# Patient Record
Sex: Female | Born: 1935 | Race: White | Hispanic: No | Smoking: Never smoker
Health system: Southern US, Community
[De-identification: ages and names within clinical notes are randomized; demographics above are authoritative.]

## PROBLEM LIST (undated history)

## (undated) DIAGNOSIS — I519 Heart disease, unspecified: Secondary | ICD-10-CM

## (undated) DIAGNOSIS — I341 Nonrheumatic mitral (valve) prolapse: Secondary | ICD-10-CM

## (undated) DIAGNOSIS — C4491 Basal cell carcinoma of skin, unspecified: Secondary | ICD-10-CM

## (undated) DIAGNOSIS — I5189 Other ill-defined heart diseases: Secondary | ICD-10-CM

## (undated) DIAGNOSIS — I10 Essential (primary) hypertension: Secondary | ICD-10-CM

## (undated) DIAGNOSIS — I493 Ventricular premature depolarization: Secondary | ICD-10-CM

## (undated) DIAGNOSIS — N200 Calculus of kidney: Secondary | ICD-10-CM

## (undated) DIAGNOSIS — I951 Orthostatic hypotension: Secondary | ICD-10-CM

## (undated) DIAGNOSIS — C439 Malignant melanoma of skin, unspecified: Secondary | ICD-10-CM

## (undated) DIAGNOSIS — G9001 Carotid sinus syncope: Secondary | ICD-10-CM

## (undated) DIAGNOSIS — I272 Pulmonary hypertension, unspecified: Secondary | ICD-10-CM

## (undated) DIAGNOSIS — I219 Acute myocardial infarction, unspecified: Secondary | ICD-10-CM

## (undated) DIAGNOSIS — K5520 Angiodysplasia of colon without hemorrhage: Secondary | ICD-10-CM

## (undated) DIAGNOSIS — I442 Atrioventricular block, complete: Secondary | ICD-10-CM

## (undated) DIAGNOSIS — D126 Benign neoplasm of colon, unspecified: Secondary | ICD-10-CM

## (undated) DIAGNOSIS — Z950 Presence of cardiac pacemaker: Secondary | ICD-10-CM

## (undated) DIAGNOSIS — R55 Syncope and collapse: Secondary | ICD-10-CM

## (undated) DIAGNOSIS — K5790 Diverticulosis of intestine, part unspecified, without perforation or abscess without bleeding: Secondary | ICD-10-CM

## (undated) DIAGNOSIS — E785 Hyperlipidemia, unspecified: Secondary | ICD-10-CM

## (undated) DIAGNOSIS — I2510 Atherosclerotic heart disease of native coronary artery without angina pectoris: Secondary | ICD-10-CM

## (undated) DIAGNOSIS — IMO0002 Reserved for concepts with insufficient information to code with codable children: Secondary | ICD-10-CM

## (undated) DIAGNOSIS — I071 Rheumatic tricuspid insufficiency: Secondary | ICD-10-CM

## (undated) HISTORY — DX: Basal cell carcinoma of skin, unspecified: C44.91

## (undated) HISTORY — DX: Atrioventricular block, complete: I44.2

## (undated) HISTORY — DX: Other ill-defined heart diseases: I51.89

## (undated) HISTORY — DX: Presence of cardiac pacemaker: Z95.0

## (undated) HISTORY — DX: Benign neoplasm of colon, unspecified: D12.6

## (undated) HISTORY — PX: SKIN CANCER EXCISION: SHX779

## (undated) HISTORY — PX: PACEMAKER INSERTION: SHX728

## (undated) HISTORY — DX: Malignant melanoma of skin, unspecified: C43.9

## (undated) HISTORY — DX: Pulmonary hypertension, unspecified: I27.20

## (undated) HISTORY — DX: Heart disease, unspecified: I51.9

## (undated) HISTORY — DX: Rheumatic tricuspid insufficiency: I07.1

## (undated) HISTORY — DX: Hyperlipidemia, unspecified: E78.5

## (undated) HISTORY — PX: CARDIAC CATHETERIZATION: SHX172

## (undated) HISTORY — DX: Reserved for concepts with insufficient information to code with codable children: IMO0002

## (undated) HISTORY — DX: Atherosclerotic heart disease of native coronary artery without angina pectoris: I25.10

## (undated) HISTORY — DX: Nonrheumatic mitral (valve) prolapse: I34.1

## (undated) HISTORY — PX: CATARACT EXTRACTION: SUR2

---
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
## 1969-04-03 HISTORY — PX: TONSILLECTOMY: SHX5217

## 1978-04-03 HISTORY — PX: ABDOMINAL HYSTERECTOMY: SHX81

## 1979-04-04 HISTORY — PX: NASAL RECONSTRUCTION: SHX2069

## 1999-04-20 ENCOUNTER — Encounter: Admission: RE | Admit: 1999-04-20 | Discharge: 1999-04-20 | Payer: Self-pay | Admitting: Gynecology

## 1999-04-20 ENCOUNTER — Encounter: Payer: Self-pay | Admitting: Gynecology

## 2000-01-11 ENCOUNTER — Encounter (INDEPENDENT_AMBULATORY_CARE_PROVIDER_SITE_OTHER): Payer: Self-pay

## 2000-01-11 ENCOUNTER — Other Ambulatory Visit: Admission: RE | Admit: 2000-01-11 | Discharge: 2000-01-11 | Payer: Self-pay | Admitting: Otolaryngology

## 2000-03-12 ENCOUNTER — Other Ambulatory Visit: Admission: RE | Admit: 2000-03-12 | Discharge: 2000-03-12 | Payer: Self-pay | Admitting: Gynecology

## 2000-04-24 ENCOUNTER — Encounter (HOSPITAL_BASED_OUTPATIENT_CLINIC_OR_DEPARTMENT_OTHER): Payer: Self-pay | Admitting: Internal Medicine

## 2000-04-24 ENCOUNTER — Encounter: Admission: RE | Admit: 2000-04-24 | Discharge: 2000-04-24 | Payer: Self-pay | Admitting: Internal Medicine

## 2001-04-19 ENCOUNTER — Encounter: Admission: RE | Admit: 2001-04-19 | Discharge: 2001-04-19 | Payer: Self-pay | Admitting: Internal Medicine

## 2001-04-19 ENCOUNTER — Encounter: Payer: Self-pay | Admitting: Internal Medicine

## 2002-04-14 ENCOUNTER — Encounter: Payer: Self-pay | Admitting: Internal Medicine

## 2002-04-14 ENCOUNTER — Encounter: Admission: RE | Admit: 2002-04-14 | Discharge: 2002-04-14 | Payer: Self-pay | Admitting: Internal Medicine

## 2002-11-28 ENCOUNTER — Ambulatory Visit (HOSPITAL_COMMUNITY): Admission: RE | Admit: 2002-11-28 | Discharge: 2002-11-28 | Payer: Self-pay | Admitting: Cardiovascular Disease

## 2003-05-15 ENCOUNTER — Encounter: Admission: RE | Admit: 2003-05-15 | Discharge: 2003-05-15 | Payer: Self-pay | Admitting: Internal Medicine

## 2003-05-26 ENCOUNTER — Other Ambulatory Visit: Admission: RE | Admit: 2003-05-26 | Discharge: 2003-05-26 | Payer: Self-pay | Admitting: Gynecology

## 2004-05-25 ENCOUNTER — Encounter: Admission: RE | Admit: 2004-05-25 | Discharge: 2004-05-25 | Payer: Self-pay | Admitting: Internal Medicine

## 2005-06-02 ENCOUNTER — Ambulatory Visit: Payer: Self-pay | Admitting: Gastroenterology

## 2005-06-02 ENCOUNTER — Encounter (INDEPENDENT_AMBULATORY_CARE_PROVIDER_SITE_OTHER): Payer: Self-pay | Admitting: Specialist

## 2005-06-02 ENCOUNTER — Observation Stay (HOSPITAL_COMMUNITY): Admission: AD | Admit: 2005-06-02 | Discharge: 2005-06-03 | Payer: Self-pay | Admitting: Internal Medicine

## 2005-06-06 ENCOUNTER — Ambulatory Visit: Payer: Self-pay | Admitting: Gastroenterology

## 2005-06-07 ENCOUNTER — Encounter: Admission: RE | Admit: 2005-06-07 | Discharge: 2005-06-07 | Payer: Self-pay | Admitting: Internal Medicine

## 2005-06-15 ENCOUNTER — Ambulatory Visit (HOSPITAL_COMMUNITY): Admission: RE | Admit: 2005-06-15 | Discharge: 2005-06-15 | Payer: Self-pay | Admitting: Gastroenterology

## 2006-02-21 HISTORY — PX: CARDIOVASCULAR STRESS TEST: SHX262

## 2006-06-11 ENCOUNTER — Encounter: Admission: RE | Admit: 2006-06-11 | Discharge: 2006-06-11 | Payer: Self-pay | Admitting: Internal Medicine

## 2007-06-13 ENCOUNTER — Encounter: Admission: RE | Admit: 2007-06-13 | Discharge: 2007-06-13 | Payer: Self-pay | Admitting: Internal Medicine

## 2008-06-15 ENCOUNTER — Encounter: Admission: RE | Admit: 2008-06-15 | Discharge: 2008-06-15 | Payer: Self-pay | Admitting: Internal Medicine

## 2009-08-04 ENCOUNTER — Encounter: Admission: RE | Admit: 2009-08-04 | Discharge: 2009-08-04 | Payer: Self-pay | Admitting: Internal Medicine

## 2009-09-01 ENCOUNTER — Ambulatory Visit: Payer: Self-pay | Admitting: Vascular Surgery

## 2010-08-10 ENCOUNTER — Other Ambulatory Visit: Payer: Self-pay | Admitting: Gynecology

## 2010-08-10 DIAGNOSIS — Z1231 Encounter for screening mammogram for malignant neoplasm of breast: Secondary | ICD-10-CM

## 2010-08-16 NOTE — Procedures (Signed)
CAROTID DUPLEX EXAM   INDICATION:  Carotid bruit.   HISTORY:  Diabetes:  No.  Cardiac:  MI.  Hypertension:  On medications.  Smoking:  No.  Previous Surgery:  No.  CV History:  Asymptomatic.  Amaurosis Fugax No, Paresthesias No, Hemiparesis No                                       RIGHT             LEFT  Brachial systolic pressure:         130               132  Brachial Doppler waveforms:         WNL               WNL  Vertebral direction of flow:        Antegrade         Antegrade  DUPLEX VELOCITIES (cm/sec)  CCA peak systolic                   65                80  ECA peak systolic                   86                87  ICA peak systolic                   P=58, M/D=158     P=78, M/D=189  ICA end diastolic                   P=18, M/D=46      P=23, M/D=54  PLAQUE MORPHOLOGY:                  Mixed             Mixed  PLAQUE AMOUNT:                      Mild              Mild  PLAQUE LOCATION:                    Bifurcation/ICA   Bifurcation/ICA   IMPRESSION:  1. Bilateral mid/distal internal carotid artery velocities are      suggestive of 40%-59% stenosis.  2. Bilateral mid/distal internal carotid arteries are tortuous, which      may be elevating velocities.        ___________________________________________  Janetta Hora Fields, MD   AS/MEDQ  D:  09/01/2009  T:  09/01/2009  Job:  [id number]

## 2010-08-17 ENCOUNTER — Ambulatory Visit: Payer: Self-pay

## 2010-08-19 NOTE — H&P (Signed)
NAMECHRISTIANNA, Newman                   ACCOUNT NO.:  [id number]   MEDICAL RECORD NO.:  [id number]                   PATIENT TYPE:  OIB   LOCATION:                                       FACILITY:  [facility]   PHYSICIAN:  Vesta Mixer, M.D.              DATE OF BIRTH:  April 11, 1927   DATE OF ADMISSION:  11/28/2002  DATE OF DISCHARGE:                                HISTORY & PHYSICAL   HISTORY OF PRESENT ILLNESS:  Joy Newman is a 77 year old female with no  significant past cardiac history.  She recently had an episode of chest pain  and a subsequent stress Cardiolite which revealed the presence of an apical  infarction.  She is now referred for a heart catheterization for further  evaluation.   [name] has been relatively healthy.  She has not had any episodes of  chest pain or shortness of breath.  She has had tonsillectomy, appendectomy,  and a hysterectomy.  She has never had any specific cardiac problems.  Last  week, she had a severe episode of chest pain and chest tightness.  It  radiated through to her back.  There was no associated shortness of breath.  She gradually got better but did go to see her medical doctor who referred  her here for stress Cardiolite study.   CURRENT MEDICATIONS:  1. Premarin 0.625 mg q.d.  2. Calcium 1 q.d.  3. Vitamin E 1 q.d.  4. Vitamin C 1 q.d.  5. Multivitamin 1 q.d.  6. Aspirin 81 mg q.d.   ALLERGIES:  She is allergic to QUININE and TETRACYCLINE.   PAST MEDICAL HISTORY:  1. History of appendectomy.  2. History of tonsillectomy.  3. History of hysterectomy.   SOCIAL HISTORY:  The patient does not smoke.  She drinks alcohol rarely.   FAMILY HISTORY:  Noncontributory.   REVIEW OF SYMPTOMS:  Reviewed today and is essentially negative.   PHYSICAL EXAMINATION:  GENERAL:  She is an elderly female in no acute  distress.  She is alert and oriented x 3 and her mood and affect are normal.  VITAL SIGNS:  Her weight is 115, blood  pressure is 134/70 with a heart rate  of 80.  NECK:  There are 2+ carotids.  She has no bruits, JVD, and no thyromegaly.  LUNGS:  Clear to auscultation.  HEART:  Regular rate, S1 and S2. No murmurs, rubs, or gallops.  ABDOMEN:  Good bowel sounds and nontender.  EXTREMITIES:  She has no clubbing, cyanosis, or edema.  NEUROLOGICAL:  Nonfocal.  Cranial nerves II through XII intact.  Motor and  sensory function are intact.   IMPRESSION:  Joy Newman presents with a recent episode of chest pain which is  consistent with angina.  She had a stress Cardiolite study which revealed  the presence of an apical infarction with a mildly to moderately depressed  left ventricular systolic function.  I have recommended that we  proceed with  heart catheterization.  We discussed the risks, benefits, and options of  heart catheterization.  She understands and agrees to proceed.                                                Vesta Mixer, M.D.    PJN/MEDQ  D:  11/27/2002  T:  11/27/2002  Job:  [id number]   cc:   Gaspar Garbe, M.D.  [address]  [place]  [place] [address]  Fax: [phone]   Cardiac Catheter Lab

## 2010-08-19 NOTE — H&P (Signed)
Joy Newman, Joy Newman       ACCOUNT NO.:  [id number]   MEDICAL RECORD NO.:  [id number]          PATIENT TYPE:  INP   LOCATION:  5735                         FACILITY:  [facility]   PHYSICIAN:  Gaspar Garbe, M.D.DATE OF BIRTH:  12-Dec-1927   DATE OF ADMISSION:  06/02/2005  DATE OF DISCHARGE:                                HISTORY & PHYSICAL   CHIEF COMPLAINT:  Anemia requiring transfusion.   HISTORY OF PRESENT ILLNESS:  The patient is a 77 year old white female with  a history of colonic arteriovenous malformations, noted on colonoscopy in  December 2005, being heme-positive.  She has never had a history of a true  anemia with this.  The patient came in for her usual visit on May 31, 2005, complaining of some weakness, but was still participating in her yoga  and regular activities.  Workup was undertaken involving metabolic tests as  well as blood count, with the result of her hemoglobin showing that her  hemoglobin was 6.8.  The patient was called on the evening of May 31, 2005, and urged to be admitted to the hospital, which she refused because  she had tickets for the [employer] basketball game and had a friend whom she  needed to take to [facility], who was very ill on Thursday.  I indicated to her  that she needed to go directly to the hospital if she developed any new  symptoms, including weakness or fatigue or any dark tarry stools, and that  it would be in her best interest to be transfused before proceeding with her  above-mentioned plans; however, the patient would hear nothing of it and  took the risk on her own.  Arrangements were made for her to be electively  admitted on the morning of June 02, 2005, for the above-needed transfusion,  and this has been done.   On seeing the patient in the hospital, she seemed to be very content and had  not had any progression in her symptoms over the past couple of days, and  had no complaints of any changes in bowel  movements.   ALLERGIES:  QUININE AND TETRACYCLINE.   MEDICATIONS:  1.  Premarin 0.3 mg p.o. daily.  2.  Calcium b.i.d.  3.  Vitamin D b.i.d.  4.  Vitamin C 1000 mg daily.  5.  Aspirin 81 mg daily.  6.  Multivitamin.  7.  Bio-10.  8.  Cardizem LA 120 mg p.o. q.p.m.  9.  Maxzide 37.5 mg/25 mg, 1/2 tab daily.   PAST MEDICAL HISTORY:  1.  Arteriovenous malformations on colonoscopy in December 2005, done by Dr.      Judie Petit T. Russella Dar.  2.  Mitral valve prolapse.  3.  Hyperlipidemia.  4.  Kidney stones.  5.  Osteopenia.   PAST SURGICAL HISTORY:  1.  The patient has had a melanoma removal from her right shoulder in      February 2006.  2.  The patient has had a basal cell removal from her left leg.  3.  Tonsillectomy.  4.  Appendectomy.  5.  Total abdominal hysterectomy with BSO.  6.  Nasal reconstruction.  SOCIAL HISTORY:  The patient lives in [place] by herself.  She is a  retired Higher education careers adviser.  She is a non-smoker, non-drinker.   FAMILY HISTORY:  Mother died at age 29, of a CVA.  Father died at age 58, of  nephritis.  She had a brother who died of lung cancer, who also had coronary  artery disease and strokes.  A sister died of nephritis at age 6.  Of her  surviving relatives, they have had TIA's, macular degeneration and  congestive heart failure.  She has no children.   REVIEW OF SYSTEMS:  The patient notes fatigue as noted above, but has no  specific complaints on an 11-point review.   INSTRUCTIVES:  The patient is a full code.   PHYSICAL EXAMINATION:  VITAL SIGNS:  Temp 97.4F, BP 131/67, HR 73, RR 20.  GENERAL:  In no acute distress.  HEENT:  Normocephalic and atraumatic.  Pupils equal, round, reactive to  light and accommodation.  EOMI.  ENT within normal limits.  NECK:  Supple, no lymphadenopathy, jugular venous distention or bruit.  HEART:  A regular rate and rhythm.  No murmur, rub or gallop appreciated  actively.  LUNGS:  Clear to  auscultation bilaterally.  ABDOMEN:  Soft, nontender.  Normoactive bowel sounds.  No  hepatosplenomegaly.  EXTREMITIES:  No clubbing, cyanosis or edema.  RECTAL:  The patient has trace guaiac positivity.  NEUROLOGIC:  Oriented x3.  Cranial nerves II-XII  intact.  No lateralizing  signs or weakness.   LABORATORY DATA:  Done on May 31, 2005:  Hemoglobin 6.8, hematocrit  21.2, MCV 73, white count normal at 5.6, platelets 284.  This was compared  to her August 2006, hemoglobin of 11, hematocrit 33 with an MCV of 88,.   ASSESSMENT/PLAN:  1.  Anemia requiring transfusion:  The patient will have 2 units of packed      red blood cells transfused today and given her decreased MCV, this is      most likely chronic iron deficiency anemia.  Will add iron      supplementations at the time of discharge, after she has followed a GI      workup.  Will also check a B12 and folate level, to look for other      causes.  Her aspirin has been held, and we will add a PPI.  Dr. Ardell Isaacs      office has been notified for a consultation.  The patient will probably      undergo an EGD, colonoscopy or any combination thereof for further      workup.  2.  Hypertension:  Continue on her Maxzide and Cardizem.  3.  Osteopenia:  Continue on calcium and vitamin D twice daily.  4.  Mitral valve prolapse: Will need endocarditis prophylaxis pre-procedure,      as had been done during her colonoscopy.      Gaspar Garbe, M.D.  Electronically Signed     RWT/MEDQ  D:  06/02/2005  T:  06/02/2005  Job:  [id number]

## 2010-08-19 NOTE — Cardiovascular Report (Signed)
Joy Newman, Joy Newman                   ACCOUNT NO.:  [id number]   MEDICAL RECORD NO.:  [id number]                   PATIENT TYPE:  OIB   LOCATION:  2899                                 FACILITY:  [facility]   PHYSICIAN:  Vesta Mixer, M.D.              DATE OF BIRTH:  1928/01/12   DATE OF PROCEDURE:  11/28/2002  DATE OF DISCHARGE:                              CARDIAC CATHETERIZATION   PROCEDURE:  Left heart catheterization with coronary angiography and  aortography.   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  She recently had an episode of  chest pain and was referred to our office for a stress Cardiolite study.  She was found to have an apical defect consistent with a recent apical  myocardial infarction.  She was referred for heart catheterization for  further evaluation.   DESCRIPTION OF PROCEDURE:  The right femoral artery was easily cannulated  using a modified Seldinger technique.   HEMODYNAMICS:  The left ventricular pressure is 148/80 with an aortic  pressure of 149/77.   ANGIOGRAPHY:  1. The left main is moderate in size and is normal.  2. The left anterior descending artery is a relatively large vessel.  It is     quite tortuous throughout its course.  There are no discreet stenoses.  3. The first diagonal artery is a moderate to large vessel.  It is fairly     normal.  4. The left circumflex artery is a fairly small system.  It is small and     basically provides a small obtuse marginal vessel.  5. The right coronary artery is large and dominant.  It has a relatively     anterior takeoff and was best engaged using an Amplatz left-1 catheter.     The posterior descending artery and the posterolateral segment artery are     all normal.   An aortogram was performed to evaluate for the possibility of an anomalous  circumflex artery.  There were no other vessels seen.  Only the  right  coronary artery and the left main with the relatively small circumflex were  identified.   LEFT VENTRICULOGRAM:  The left ventriculogram was performed in a 30 RAO  position.  It revealed apical akinesis.  The overall ejection fraction is  around 45-50%.  It appears to be slightly better than was previously seen on  the Cardiolite scan.   COMPLICATIONS:  None.    CONCLUSION:  Probable recent anterior apical myocardial infarction.  She has  no discreet stenosis, but most likely had spasm of her tortuous left  anterior descending.  The left anterior descending is quite small down  toward the apex and it is quite possible that she had some spasm.  She does  not need any PTCA or bypass surgery.  We will continue with medical therapy.  Vesta Mixer, M.D.    PJN/MEDQ  D:  11/28/2002  T:  11/28/2002  Job:  [id number]   cc:   Gaspar Garbe, M.D.  [address]  [place]  [place] [address]  Fax: [phone]

## 2010-08-25 ENCOUNTER — Ambulatory Visit
Admission: RE | Admit: 2010-08-25 | Discharge: 2010-08-25 | Disposition: A | Discharge status: Home / Self Care | Payer: Medicare Other | Source: Ambulatory Visit | Attending: Gynecology | Admitting: Gynecology

## 2010-08-25 DIAGNOSIS — Z1231 Encounter for screening mammogram for malignant neoplasm of breast: Secondary | ICD-10-CM

## 2011-04-10 DIAGNOSIS — Z85820 Personal history of malignant melanoma of skin: Secondary | ICD-10-CM | POA: Diagnosis not present

## 2011-04-10 DIAGNOSIS — Z85828 Personal history of other malignant neoplasm of skin: Secondary | ICD-10-CM | POA: Diagnosis not present

## 2011-04-10 DIAGNOSIS — D239 Other benign neoplasm of skin, unspecified: Secondary | ICD-10-CM | POA: Diagnosis not present

## 2011-04-10 DIAGNOSIS — L821 Other seborrheic keratosis: Secondary | ICD-10-CM | POA: Diagnosis not present

## 2011-04-11 ENCOUNTER — Encounter: Payer: Self-pay | Admitting: *Deleted

## 2011-04-11 ENCOUNTER — Telehealth: Payer: Self-pay | Admitting: Cardiovascular Disease

## 2011-04-11 ENCOUNTER — Emergency Department (HOSPITAL_COMMUNITY): Payer: Medicare Other

## 2011-04-11 ENCOUNTER — Encounter (HOSPITAL_COMMUNITY): Payer: Self-pay

## 2011-04-11 ENCOUNTER — Encounter: Payer: Self-pay | Admitting: Cardiovascular Disease

## 2011-04-11 ENCOUNTER — Encounter: Payer: Self-pay | Admitting: Internal Medicine

## 2011-04-11 ENCOUNTER — Inpatient Hospital Stay (HOSPITAL_COMMUNITY)
Admission: EM | Admit: 2011-04-11 | Discharge: 2011-04-14 | DRG: 243 | Disposition: A | Discharge status: Home / Self Care | Payer: Medicare Other | Attending: Cardiovascular Disease | Admitting: Cardiovascular Disease

## 2011-04-11 DIAGNOSIS — Z7982 Long term (current) use of aspirin: Secondary | ICD-10-CM

## 2011-04-11 DIAGNOSIS — R079 Chest pain, unspecified: Secondary | ICD-10-CM | POA: Diagnosis not present

## 2011-04-11 DIAGNOSIS — R0789 Other chest pain: Secondary | ICD-10-CM | POA: Diagnosis not present

## 2011-04-11 DIAGNOSIS — R002 Palpitations: Secondary | ICD-10-CM | POA: Diagnosis not present

## 2011-04-11 DIAGNOSIS — I5189 Other ill-defined heart diseases: Secondary | ICD-10-CM

## 2011-04-11 DIAGNOSIS — I519 Heart disease, unspecified: Secondary | ICD-10-CM | POA: Diagnosis not present

## 2011-04-11 DIAGNOSIS — I059 Rheumatic mitral valve disease, unspecified: Secondary | ICD-10-CM | POA: Diagnosis present

## 2011-04-11 DIAGNOSIS — G9001 Carotid sinus syncope: Secondary | ICD-10-CM | POA: Diagnosis present

## 2011-04-11 DIAGNOSIS — E785 Hyperlipidemia, unspecified: Secondary | ICD-10-CM | POA: Diagnosis not present

## 2011-04-11 DIAGNOSIS — R55 Syncope and collapse: Secondary | ICD-10-CM

## 2011-04-11 DIAGNOSIS — I252 Old myocardial infarction: Secondary | ICD-10-CM

## 2011-04-11 DIAGNOSIS — K5520 Angiodysplasia of colon without hemorrhage: Secondary | ICD-10-CM | POA: Insufficient documentation

## 2011-04-11 DIAGNOSIS — I2510 Atherosclerotic heart disease of native coronary artery without angina pectoris: Secondary | ICD-10-CM | POA: Diagnosis present

## 2011-04-11 DIAGNOSIS — I495 Sick sinus syndrome: Principal | ICD-10-CM | POA: Diagnosis present

## 2011-04-11 DIAGNOSIS — I10 Essential (primary) hypertension: Secondary | ICD-10-CM | POA: Diagnosis not present

## 2011-04-11 DIAGNOSIS — I493 Ventricular premature depolarization: Secondary | ICD-10-CM | POA: Insufficient documentation

## 2011-04-11 DIAGNOSIS — R918 Other nonspecific abnormal finding of lung field: Secondary | ICD-10-CM | POA: Diagnosis not present

## 2011-04-11 DIAGNOSIS — I071 Rheumatic tricuspid insufficiency: Secondary | ICD-10-CM

## 2011-04-11 DIAGNOSIS — I440 Atrioventricular block, first degree: Secondary | ICD-10-CM | POA: Diagnosis present

## 2011-04-11 DIAGNOSIS — Z79899 Other long term (current) drug therapy: Secondary | ICD-10-CM

## 2011-04-11 DIAGNOSIS — I2789 Other specified pulmonary heart diseases: Secondary | ICD-10-CM | POA: Diagnosis present

## 2011-04-11 DIAGNOSIS — Z9181 History of falling: Secondary | ICD-10-CM | POA: Diagnosis not present

## 2011-04-11 DIAGNOSIS — I079 Rheumatic tricuspid valve disease, unspecified: Secondary | ICD-10-CM | POA: Diagnosis present

## 2011-04-11 DIAGNOSIS — I341 Nonrheumatic mitral (valve) prolapse: Secondary | ICD-10-CM | POA: Insufficient documentation

## 2011-04-11 DIAGNOSIS — R001 Bradycardia, unspecified: Secondary | ICD-10-CM

## 2011-04-11 DIAGNOSIS — I442 Atrioventricular block, complete: Secondary | ICD-10-CM | POA: Diagnosis present

## 2011-04-11 HISTORY — DX: Ventricular premature depolarization: I49.3

## 2011-04-11 HISTORY — DX: Angiodysplasia of colon without hemorrhage: K55.20

## 2011-04-11 HISTORY — DX: Diverticulosis of intestine, part unspecified, without perforation or abscess without bleeding: K57.90

## 2011-04-11 HISTORY — DX: Essential (primary) hypertension: I10

## 2011-04-11 HISTORY — DX: Calculus of kidney: N20.0

## 2011-04-11 HISTORY — DX: Syncope and collapse: R55

## 2011-04-11 HISTORY — DX: Carotid sinus syncope: G90.01

## 2011-04-11 LAB — DIFFERENTIAL
Eosinophils Absolute: 0 10*3/uL (ref 0.0–0.7)
Eosinophils Relative: 0 % (ref 0–5)
Lymphs Abs: 1.9 10*3/uL (ref 0.7–4.0)
Monocytes Relative: 6 % (ref 3–12)

## 2011-04-11 LAB — BASIC METABOLIC PANEL
CO2: 31 mEq/L (ref 19–32)
GFR calc Af Amer: >90 mL/min (ref >90)
Glucose, Bld: 113 mg/dL — ABNORMAL HIGH (ref 70–99)
Potassium: 3.7 mEq/L (ref 3.5–5.1)
Sodium: 135 mEq/L (ref 135–145)

## 2011-04-11 LAB — PROTIME-INR
INR: 0.91 (ref 0.00–1.49)
Prothrombin Time: 12.5 seconds (ref 11.6–15.2)

## 2011-04-11 LAB — COMPREHENSIVE METABOLIC PANEL
ALT: 15 U/L (ref 0–35)
Albumin: 3.7 g/dL (ref 3.5–5.2)
Alkaline Phosphatase: 73 U/L (ref 39–117)
Chloride: 95 mEq/L — ABNORMAL LOW (ref 96–112)
GFR calc Af Amer: >90 mL/min (ref >90)
Glucose, Bld: 113 mg/dL — ABNORMAL HIGH (ref 70–99)
Potassium: 3.7 mEq/L (ref 3.5–5.1)
Sodium: 137 mEq/L (ref 135–145)
Total Protein: 6.6 g/dL (ref 6.0–8.3)

## 2011-04-11 LAB — CBC
Hemoglobin: 13.6 g/dL (ref 12.0–15.0)
MCHC: 34.1 g/dL (ref 30.0–36.0)
MCHC: 33.9 g/dL (ref 30.0–36.0)
MCV: 98.6 fL (ref 78.0–100.0)
RBC: 4.05 MIL/uL (ref 3.87–5.11)
WBC: 10.9 10*3/uL — ABNORMAL HIGH (ref 4.0–10.5)
WBC: 12.1 10*3/uL — ABNORMAL HIGH (ref 4.0–10.5)

## 2011-04-11 LAB — POCT I-STAT TROPONIN I

## 2011-04-11 LAB — URINALYSIS, ROUTINE W REFLEX MICROSCOPIC
Bilirubin Urine: NEGATIVE
Glucose, UA: NEGATIVE mg/dL
Nitrite: NEGATIVE
Specific Gravity, Urine: 1.007 (ref 1.005–1.030)
pH: 7.5 (ref 5.0–8.0)

## 2011-04-11 LAB — CARDIAC PANEL(CRET KIN+CKTOT+MB+TROPI)
CK, MB: 3.7 ng/mL (ref 0.3–4.0)
Total CK: 140 U/L (ref 7–177)

## 2011-04-11 LAB — URINE MICROSCOPIC-ADD ON

## 2011-04-11 MED ORDER — ONDANSETRON HCL 4 MG/2ML IJ SOLN
4.0000 mg | Freq: Once | INTRAMUSCULAR | Status: AC
  Administered 2011-04-11: 4 mg via INTRAVENOUS
  Filled 2011-04-11 (×2): qty 2

## 2011-04-11 MED ORDER — DILTIAZEM HCL ER BEADS 120 MG PO CP24
120.0000 mg | ORAL_CAPSULE | Freq: Every day | ORAL | Status: DC
Start: 2011-04-12 — End: 2011-04-12
  Filled 2011-04-11: qty 1

## 2011-04-11 MED ORDER — VITAMIN D3 25 MCG (1000 UNIT) PO TABS
2000.0000 [IU] | ORAL_TABLET | Freq: Every day | ORAL | Status: DC
  Administered 2011-04-12 – 2011-04-13 (×2): 2000 [IU] via ORAL
  Filled 2011-04-11 (×2): qty 2

## 2011-04-11 MED ORDER — ENOXAPARIN SODIUM 40 MG/0.4ML ~~LOC~~ SOLN
40.0000 mg | SUBCUTANEOUS | Status: DC
  Administered 2011-04-12 – 2011-04-13 (×2): 40 mg via SUBCUTANEOUS
  Filled 2011-04-11 (×2): qty 0.4

## 2011-04-11 MED ORDER — TRIAMTERENE-HCTZ 37.5-25 MG PO TABS
0.5000 | ORAL_TABLET | Freq: Every day | ORAL | Status: DC
  Administered 2011-04-12 – 2011-04-14 (×3): 0.5 via ORAL
  Filled 2011-04-11 (×3): qty 0.5

## 2011-04-11 MED ORDER — ESTROGENS CONJUGATED 0.3 MG PO TABS
0.3000 mg | ORAL_TABLET | Freq: Every day | ORAL | Status: DC
  Administered 2011-04-12 – 2011-04-13 (×2): 0.3 mg via ORAL
  Filled 2011-04-11 (×2): qty 1

## 2011-04-11 MED ORDER — ASPIRIN 81 MG PO CHEW
81.0000 mg | CHEWABLE_TABLET | Freq: Every day | ORAL | Status: DC
  Administered 2011-04-12 – 2011-04-13 (×2): 81 mg via ORAL
  Filled 2011-04-11 (×2): qty 1

## 2011-04-11 MED ORDER — PANTOPRAZOLE SODIUM 40 MG PO TBEC
40.0000 mg | DELAYED_RELEASE_TABLET | Freq: Every day | ORAL | Status: DC
  Administered 2011-04-12 – 2011-04-14 (×3): 40 mg via ORAL
  Filled 2011-04-11 (×3): qty 1

## 2011-04-11 NOTE — ED Notes (Signed)
The pts iv has been leaking all  Connections tightened.  Pt c/o a lt  Sided headache.  Ice pack refilled and given to the pt for her headache

## 2011-04-11 NOTE — ED Notes (Signed)
Pt transported to CT ?

## 2011-04-11 NOTE — Telephone Encounter (Signed)
Fu to previous problem Pt wants to see Dr Elease Hashimoto asap. She didn't want to see Lawson Fiscal please call her back

## 2011-04-11 NOTE — ED Notes (Signed)
The pt is alert family at the bedside.  She says her nausea is almost gone,  Alert oriented skin warm and dry

## 2011-04-11 NOTE — ED Notes (Signed)
Dr Donnie Aho at the beside.  The pt has been sleeping intermittently

## 2011-04-11 NOTE — Telephone Encounter (Signed)
TRIED BOTH NUMBERS NO ANSWER

## 2011-04-11 NOTE — Telephone Encounter (Signed)
New problem Pt said she is having irregular heartbeats she wants to talk to you. No sob or chest pain. Please call

## 2011-04-11 NOTE — Telephone Encounter (Signed)
Saw in 2004, hospital

## 2011-04-11 NOTE — Telephone Encounter (Signed)
Spoke with friend and she is at pcp currently.

## 2011-04-11 NOTE — Telephone Encounter (Signed)
FU Call: Pt friend calling wanting to talk with nurse about pt condition. Pt said she saw Dr. Elease Hashimoto every year for a FU and is having some chest tightness as well as irregular HR. Pt would like to know if she can be seen today or ASAP. Please return pt call to discuss further.

## 2011-04-11 NOTE — ED Provider Notes (Signed)
History     CSN: [id number]  Arrival date & time 04/11/11  1731   First MD Initiated Contact with Patient 04/11/11 1731      Chief Complaint  Patient presents with  . Fall  . Altered Mental Status    (Consider location/radiation/quality/duration/timing/severity/associated sxs/prior treatment) HPI history obtained from patient and family members Patient is an 78 year old female with history of mitral valve prolapse and tricuspid regurgitation who presents for syncope. Just prior to arrival, patient had unwitnessed syncopal episode. Her family members in the house and heard her fall in and saw her facedown on a carpeted area. Patient is amnestic to events just before, during, just after the event. When family members are on the ground, she was alert. There is no seizure activity or postictal period. Patient has had issues with generalized weakness as well as palpitations and lightheadedness recently. She has seen her primary doctor for this recently. She is scheduled for a cardiology evaluation tomorrow. Today when she saw primary doctor, she was noted to have tachycardia and ectopy per patient report. Her diltiazem was doubled from 120-40. She's taken one dose of that. She has had intermittent low sodium sternal burning discomfort. But she denies frank chest pain. She has not had dyspnea. She denies lower extremity edema. She denies headache or neck pain after the fall. She has normal strength. She also denies any change in her sensation. Overall severity mouth noted to be moderate. There are no specific modifying factors described.  Past Medical History  Diagnosis Date  . Hyperlipidemia   . Diastolic dysfunction   . Mitral valve prolapse     mitral regurgitation  . CAD (coronary artery disease)     Hx of with an anteroapical M.I  . Pulmonary hypertension      Hx of Mild  . Tricuspid regurgitation     Moderate regurgitation  . Kidney stones   . Arteriovenous malformation of colon      Past Surgical History  Procedure Date  . Appendectomy     Hx of  . Tonsillectomy     Hx of  . Abdominal hysterectomy     Hx of  . Cardiac catheterization     Revealed an apical wall motion abnormality  . Cardiovascular stress test 02-21-2006    EF 84%, which revealed an apical defect  . US echocardiography 08-27-09    EF 55-60%    History reviewed. No pertinent family history.  History  Substance Use Topics  . Smoking status: Never Smoker   . Smokeless tobacco: Never Used  . Alcohol Use: No     Rarely    OB History    Grav Para Term Preterm Abortions TAB SAB Ect Mult Living                  Review of Systems  Constitutional: Negative for fever and chills.  HENT: Negative for facial swelling and neck pain.   Eyes: Negative for visual disturbance.  Respiratory: Negative for cough, chest tightness, shortness of breath and wheezing.   Cardiovascular: Negative for chest pain.  Gastrointestinal: Positive for nausea. Negative for vomiting, abdominal pain and diarrhea.  Genitourinary: Negative for difficulty urinating.  Skin: Negative for rash.  Neurological: Negative for weakness and numbness.  Psychiatric/Behavioral: Negative for behavioral problems and confusion.  All other systems reviewed and are negative.    Allergies  Aspirin; Imdur; Quinine derivatives; and Tetracyclines & related  Home Medications   Current Outpatient Rx  Name Route Sig Dispense  Refill  . VITAMIN C 1000 MG PO TABS Oral Take 1,000 mg by mouth daily.      . ASPIRIN 81 MG PO TABS Oral Take 81 mg by mouth daily.     Marland Kitchen CALCIUM 1500 MG PO TABS Oral Take 1,500 mg by mouth 2 (two) times daily.      Marland Kitchen VITAMIN D 1000 UNITS PO TABS Oral Take 2,000 Units by mouth daily.      Marland Kitchen DILTIAZEM HCL ER BEADS 120 MG PO CP24 Oral Take 120 mg by mouth daily.      Marland Kitchen ESTROGENS CONJUGATED 0.3 MG PO TABS Oral Take 0.3 mg by mouth daily.     . SUPER STRESS 600/BIOTIN PO Oral Take 3 tablets by mouth daily.      .  MULTIVITAMIN PO Oral Take 1 tablet by mouth daily.     Marland Kitchen PANTOPRAZOLE SODIUM 40 MG PO TBEC Oral Take 40 mg by mouth daily.      . TRIAMTERENE-HCTZ 37.5-25 MG PO TABS Oral Take 0.5 tablets by mouth daily.        BP 118/57  Pulse 79  Temp(Src) 97.1 F (36.2 C) (Oral)  Resp 14  SpO2 98%  Physical Exam  Nursing note and vitals reviewed. Constitutional: She is oriented to person, place, and time. She appears well-developed and well-nourished. No distress.  HENT:  Head: Normocephalic.  Nose: Nose normal.  Mouth/Throat: Oropharynx is clear and moist.       Mild abrasion over left cheek without underlying bony tenderness to palpation. No malocclusion. No jaw tenderness palpation. No nasal tenderness to palpation. Extraocular movements are intact without pain. No change in facial sensation.  Eyes: EOM are normal. Pupils are equal, round, and reactive to light.  Neck: Normal range of motion. Neck supple. No tracheal deviation present.       No midline C spine TTP No step offs No  visible injury  Cardiovascular: Normal rate and intact distal pulses.   No murmur heard.      Irregular rhythm  Pulmonary/Chest: Effort normal and breath sounds normal. No respiratory distress. She has no wheezes. She exhibits no tenderness.  Abdominal: Soft. She exhibits no distension. There is no tenderness.       No seat belt stripe or visible injury  Musculoskeletal: Normal range of motion. She exhibits no edema and no tenderness.       No midline T or L spine TTP No step offs  Neurological: She is alert and oriented to person, place, and time. No cranial nerve deficit. Coordination normal.       Normal strength  Mild amnesia regarding events but patient is alert and oriented to person place and date.  Gross sensation intact  Skin: Skin is warm and dry. No rash noted. She is not diaphoretic.  Psychiatric: She has a normal mood and affect. Her behavior is normal. Thought content normal.    ED Course    Procedures (including critical care time)  ECG on 04/11/2011 1838: Heart rate 80. Sinus rhythm. Regular rhythm. First degree heart block. QTC normal. Normal axis. Mild nonspecific T wave changes over V5 and V6. Nonspecific inferior ST change. No prior EKG available for comparison.  Labs Reviewed  CBC - Abnormal; Notable for the following:    WBC 10.9 (*)    All other components within normal limits  BASIC METABOLIC PANEL - Abnormal; Notable for the following:    Chloride 94 (*)    Glucose, Bld 113 (*)  GFR calc non Af Amer 80 (*)    All other components within normal limits  GLUCOSE, CAPILLARY - Abnormal; Notable for the following:    Glucose-Capillary 109 (*)    All other components within normal limits  URINALYSIS, ROUTINE W REFLEX MICROSCOPIC - Abnormal; Notable for the following:    Hgb urine dipstick SMALL (*)    Ketones, ur 15 (*)    All other components within normal limits  URINE MICROSCOPIC-ADD ON - Abnormal; Notable for the following:    Squamous Epithelial / LPF FEW (*)    All other components within normal limits  MAGNESIUM  POCT I-STAT TROPONIN I  I-STAT TROPONIN I  URINALYSIS, ROUTINE W REFLEX MICROSCOPIC   Dg Chest 2 View  04/11/2011  *RADIOLOGY REPORT*  Clinical Data: Fall  CHEST - 2 VIEW  Comparison: None.  Findings: Normal cardiac silhouette.  Lungs are hyperinflated. There is apical thickening on the left and right.  No focal consolidation.  No pneumothorax.  No evidence of rib fracture.  IMPRESSION: No evidence of thoracic trauma.  Emphysematous change.  Original Report Authenticated By: Genevive Bi, M.D.   Ct Head Wo Contrast  04/11/2011  *RADIOLOGY REPORT*  Clinical Data:  Fall.  CT HEAD WITHOUT CONTRAST CT CERVICAL SPINE WITHOUT CONTRAST  Technique:  Multidetector CT imaging of the head and cervical spine was performed following the standard protocol without intravenous contrast.  Multiplanar CT image reconstructions of the cervical spine were also  generated.  Comparison:  None.  CT HEAD  Findings: No skull fracture or intracranial hemorrhage.  No CT evidence of large acute infarct.  Small acute infarct cannot be excluded by CT.  No intracranial mass lesion detected on this unenhanced exam.  IMPRESSION: None no skull fracture or intracranial hemorrhage.  CT CERVICAL SPINE  Findings: No cervical spine fracture.  Degenerative changes C1-2 articulation and C1 occipital articulation.  Mild cranial settling.  Transverse ligament hypertrophy with mild impression upon the cervical medullary junction.  Fusion of the posterior elements of C4-C5.  Minimal anterior slip of C3 felt to be related to C3-4 facet joint degenerative changes.  Various degrees of spinal stenosis and foraminal narrowing.  No abnormal prevertebral soft tissue swelling.  Scarring lung apices.  IMPRESSION: No cervical spine fracture.  Degenerative changes as noted above  Original Report Authenticated By: Fuller Canada, M.D.   Ct Cervical Spine Wo Contrast  04/11/2011  *RADIOLOGY REPORT*  Clinical Data:  Fall.  CT HEAD WITHOUT CONTRAST CT CERVICAL SPINE WITHOUT CONTRAST  Technique:  Multidetector CT imaging of the head and cervical spine was performed following the standard protocol without intravenous contrast.  Multiplanar CT image reconstructions of the cervical spine were also generated.  Comparison:  None.  CT HEAD  Findings: No skull fracture or intracranial hemorrhage.  No CT evidence of large acute infarct.  Small acute infarct cannot be excluded by CT.  No intracranial mass lesion detected on this unenhanced exam.  IMPRESSION: None no skull fracture or intracranial hemorrhage.  CT CERVICAL SPINE  Findings: No cervical spine fracture.  Degenerative changes C1-2 articulation and C1 occipital articulation.  Mild cranial settling.  Transverse ligament hypertrophy with mild impression upon the cervical medullary junction.  Fusion of the posterior elements of C4-C5.  Minimal anterior slip of  C3 felt to be related to C3-4 facet joint degenerative changes.  Various degrees of spinal stenosis and foraminal narrowing.  No abnormal prevertebral soft tissue swelling.  Scarring lung apices.  IMPRESSION: No cervical spine  fracture.  Degenerative changes as noted above  Original Report Authenticated By: Fuller Canada, M.D.     1. Syncope       MDM   Clinical picture consistent with syncope. Patient's recent generalized chest discomfort as well as palpitations and known valvular disorder raise concern for cardiogenic etiology. Her workup here is unremarkable. CT scan is negative for intracranial or cervical injury. I discussed with cardiology, who is following patient for her valvular disease and palpitations. They will admit for her syncope.       Milus Glazier 04/11/11 2211

## 2011-04-11 NOTE — H&P (Signed)
Admit date: 04/11/2011 Name:  Joy Newman Medical record number: [id number] DOB/Age:  [birth year]  78 y.o.  Referring Physician:   [facility]  Primary Cardiologist: Dr. Kristeen Miss  Chief complaint/reason for admission:  Syncope  HPI:  This 78 year old female is brought to the emergency room for evaluation of syncope. The patient's cardiac history is significant for a long-standing history of mitral valve prolapse. She had a cardiac catheterization 2004 that showed an apical wall motion abnormality but no significant obstructive coronary artery disease. She has been managed medically through the years and has been seen intermittently by Dr. Elease Hashimoto. She has a history of hyperlipidemia and has history of mild pulmonary hypertension.  She has not felt well over the past few weeks. She had an upper respiratory infection and the flu and was ill over the Christmas holidays. Last evening she developed some nausea as well as some palpitations. She went to see her primary physician this morning and saw one of his associates and her diltiazem was doubled. She was scheduled to see Dr. Elease Hashimoto.  This evening she had gotten up to go to the bathroom and was standing near her bedside when she felt somewhat nauseated felt some vague tightness in her chest and the next thing she remembered she was being picked up by the ambulance to come to the hospital. She did not remember falling and did not have significant palpitations at the time. She did not have incontinence. Her adopted son was evidently at her house and called the ambulance. She has not had any previous episodes of syncope. She has not had exertional angina and has had no PND, orthopnea or claudication. She normally does yoga.   Past Medical History  Diagnosis Date  . Hyperlipidemia   . Diastolic dysfunction   . Mitral valve prolapse     mitral regurgitation  . CAD (coronary artery disease)     Hx of with an anteroapical M.I   . Pulmonary hypertension      Hx of Mild  . Tricuspid regurgitation     Moderate regurgitation  . Kidney stones   . Arteriovenous malformation of colon        Past Surgical History  Procedure Date  . Appendectomy     Hx of  . Tonsillectomy     Hx of  . Abdominal hysterectomy     Hx of  . Cardiac catheterization     Revealed an apical wall motion abnormality  . Cardiovascular stress test 02-21-2006    EF 84%, which revealed an apical defect  . US echocardiography 08-27-09    EF 55-60%  .   Allergies: is allergic to aspirin; imdur; quinine derivatives; and tetracyclines & related.    Medications:   Prior to Admission medications   Medication Sig Start Date End Date Taking? Authorizing Provider  Ascorbic Acid (VITAMIN C) 1000 MG tablet Take 1,000 mg by mouth daily.     Yes Historical Provider, MD  aspirin 81 MG tablet Take 81 mg by mouth daily.    Yes Historical Provider, MD  Calcium 1500 MG tablet Take 1,500 mg by mouth 2 (two) times daily.     Yes Historical Provider, MD  cholecalciferol (VITAMIN D) 1000 UNITS tablet Take 2,000 Units by mouth daily.     Yes Historical Provider, MD  diltiazem (TIAZAC) 120 MG 24 hr capsule Take 120 mg by mouth daily.     Yes Historical Provider, MD  estrogens, conjugated, (PREMARIN) 0.3 MG tablet Take 0.3  mg by mouth daily.    Yes Historical Provider, MD  Multiple Vitamin (SUPER STRESS 600/BIOTIN PO) Take 3 tablets by mouth daily.     Yes Historical Provider, MD  Multiple Vitamins-Minerals (MULTIVITAMIN PO) Take 1 tablet by mouth daily.    Yes Historical Provider, MD  pantoprazole (PROTONIX) 40 MG tablet Take 40 mg by mouth daily.     Yes Historical Provider, MD  triamterene-hydrochlorothiazide (MAXZIDE-25) 37.5-25 MG per tablet Take 0.5 tablets by mouth daily.     Yes Historical Provider, MD   Family history:  Family History  Problem Relation Age of Onset  . Stroke Mother   . Kidney disease Father   . Cancer Brother   . Kidney disease  Sister     Social history:   Social History Narrative   Retired Garment/textile technologist.  Clinical research associate.  Nondrinker.  Lives alone.  Has adopted son.     Review of Systems:  General ROS: positive for  - fatigue and malaise Ophthalmic ROS: No visual problems ENT ROS: negative Respiratory ROS: Recent upper respiratory infection, mild cough productive of dark sputum. Cardiovascular ROS: See history of present illness Gastrointestinal ROS: Mild nausea, no recent GI bleeding but does have a history of transfusion several years ago. Genito-Urinary ROS: no dysuria, trouble voiding, or hematuria Musculoskeletal ROS: negative Neurological ROS: no TIA or stroke symptoms Other than as noted above, the remainder of the review of systems is normal  Physical Exam: BP 118/57  Pulse 79  Temp(Src) 97.1 F (36.2 C) (Oral)  Resp 14  SpO2 98% General appearance: alert, cooperative, appears stated age and no distress Head: Normocephalic, without obvious abnormality, atraumatic Eyes: conjunctivae/corneas clear. PERRL, EOM's intact. Fundi benign. Ears: normal TM's and external ear canals both ears Nose: Nares normal. Septum midline. Mucosa normal. No drainage or sinus tenderness. Throat: lips, mucosa, and tongue normal; teeth and gums normal Neck: no adenopathy, no carotid bruit, no JVD and supple, symmetrical, trachea midline Lungs: clear to auscultation bilaterally Heart: regular rate and rhythm, S1, S2 normal, no S3 or S4 and 2/6 mid to late systolic murmur heard at the apex Abdomen: soft, non-tender; bowel sounds normal; no masses,  no organomegaly Extremities: extremities normal, atraumatic, no cyanosis or edema Pulses: 2+ and symmetric Skin: Mild ecchymoses noted over face with mild erythema Neurologic: Alert and oriented X 3, normal strength and tone. Normal symmetric reflexes. Normal coordination and gait  Labs: Results for orders placed during the hospital encounter of 04/11/11 (from the past 24  hour(s))  CBC     Status: Abnormal   Collection Time   04/11/11  6:36 PM      Component Value Range   WBC 10.9 (*) 4.0 - 10.5 (K/uL)   RBC 4.19  3.87 - 5.11 (MIL/uL)   Hemoglobin 14.1  12.0 - 15.0 (g/dL)   HCT 16.1  09.6 - 04.5 (%)   MCV 98.6  78.0 - 100.0 (fL)   MCH 33.7  26.0 - 34.0 (pg)   MCHC 34.1  30.0 - 36.0 (g/dL)   RDW 40.9  81.1 - 91.4 (%)   Platelets 259  150 - 400 (K/uL)  BASIC METABOLIC PANEL     Status: Abnormal   Collection Time   04/11/11  6:36 PM      Component Value Range   Sodium 135  135 - 145 (mEq/L)   Potassium 3.7  3.5 - 5.1 (mEq/L)   Chloride 94 (*) 96 - 112 (mEq/L)   CO2 31  19 -  32 (mEq/L)   Glucose, Bld 113 (*) 70 - 99 (mg/dL)   BUN 8  6 - 23 (mg/dL)   Creatinine, Ser 1.61  0.50 - 1.10 (mg/dL)   Calcium 9.6  8.4 - 09.6 (mg/dL)   GFR calc non Af Amer 80 (*) >90 (mL/min)   GFR calc Af Amer >90  >90 (mL/min)  MAGNESIUM     Status: Normal   Collection Time   04/11/11  6:36 PM      Component Value Range   Magnesium 1.9  1.5 - 2.5 (mg/dL)  POCT I-STAT TROPONIN I     Status: Normal   Collection Time   04/11/11  7:02 PM      Component Value Range   Troponin i, poc 0.00  0.00 - 0.08 (ng/mL)   Comment 3           GLUCOSE, CAPILLARY     Status: Abnormal   Collection Time   04/11/11  7:54 PM      Component Value Range   Glucose-Capillary 109 (*) 70 - 99 (mg/dL)   Comment 1 Notify RN     Comment 2 Documented in Chart    URINALYSIS, ROUTINE W REFLEX MICROSCOPIC     Status: Abnormal   Collection Time   04/11/11  8:06 PM      Component Value Range   Color, Urine YELLOW  YELLOW    APPearance CLEAR  CLEAR    Specific Gravity, Urine 1.007  1.005 - 1.030    pH 7.5  5.0 - 8.0    Glucose, UA NEGATIVE  NEGATIVE (mg/dL)   Hgb urine dipstick SMALL (*) NEGATIVE    Bilirubin Urine NEGATIVE  NEGATIVE    Ketones, ur 15 (*) NEGATIVE (mg/dL)   Protein, ur NEGATIVE  NEGATIVE (mg/dL)   Urobilinogen, UA 0.2  0.0 - 1.0 (mg/dL)   Nitrite NEGATIVE  NEGATIVE    Leukocytes, UA  NEGATIVE  NEGATIVE   URINE MICROSCOPIC-ADD ON     Status: Abnormal   Collection Time   04/11/11  8:06 PM      Component Value Range   Squamous Epithelial / LPF FEW (*) RARE    WBC, UA 0-2  <3 (WBC/hpf)   RBC / HPF 0-2  <3 (RBC/hpf)    EKG: Normal sinus rhythm, no QT prolongation, no bradycardia  Radiology: CHEST - 2 VIEW  Findings: Normal cardiac silhouette. Lungs are hyperinflated. There is apical thickening on the left and right. No focal consolidation. No pneumothorax. No evidence of rib fracture.  IMPRESSION:  No evidence of thoracic trauma. Emphysematous change.  IMPRESSIONS:  1. Syncope that may have been due to change in her diltiazem versus blood pressure orthostasis 2. History mitral valve prolapse 3. Hyperlipidemia 4. History of apical wall motion abnormality at catheterization with no significant coronary artery disease 5. History of colonic arteriovenous malformations  PLAN: Admit to telemetry bed. Do orthostatic blood pressures, check echocardiogram, check serial cardiac enzymes but doubt that this is an ischemic event.   Signed: Darden Palmer. MD [facility] 04/11/2011, 10:06 PM

## 2011-04-11 NOTE — ED Notes (Signed)
Found on floor by caregiver onto soft cushion rug. Pt has had trouble with mitral valve prolapse recently, has appt with cards tomorrow for same. Pt confused to events only. Per ems non witnessed fall. No trauma noted to patient, cbg 96, 12 lead good, other vitals 150 palpated, 88-92 hr, pvc on 12 lead.

## 2011-04-12 ENCOUNTER — Encounter (HOSPITAL_COMMUNITY): Payer: Self-pay | Admitting: Internal Medicine

## 2011-04-12 ENCOUNTER — Ambulatory Visit: Payer: Medicare Other | Admitting: Cardiovascular Disease

## 2011-04-12 DIAGNOSIS — I442 Atrioventricular block, complete: Secondary | ICD-10-CM

## 2011-04-12 DIAGNOSIS — I493 Ventricular premature depolarization: Secondary | ICD-10-CM | POA: Insufficient documentation

## 2011-04-12 DIAGNOSIS — I369 Nonrheumatic tricuspid valve disorder, unspecified: Secondary | ICD-10-CM

## 2011-04-12 DIAGNOSIS — G9001 Carotid sinus syncope: Secondary | ICD-10-CM | POA: Insufficient documentation

## 2011-04-12 DIAGNOSIS — R001 Bradycardia, unspecified: Secondary | ICD-10-CM

## 2011-04-12 DIAGNOSIS — R55 Syncope and collapse: Secondary | ICD-10-CM | POA: Diagnosis not present

## 2011-04-12 DIAGNOSIS — I10 Essential (primary) hypertension: Secondary | ICD-10-CM | POA: Insufficient documentation

## 2011-04-12 LAB — TSH: TSH: 1.707 u[IU]/mL (ref 0.350–4.500)

## 2011-04-12 LAB — MRSA PCR SCREENING: MRSA by PCR: NEGATIVE

## 2011-04-12 LAB — CARDIAC PANEL(CRET KIN+CKTOT+MB+TROPI): CK, MB: 3.2 ng/mL (ref 0.3–4.0)

## 2011-04-12 MED ORDER — CHLORHEXIDINE GLUCONATE 4 % EX LIQD
60.0000 mL | Freq: Once | CUTANEOUS | Status: AC
  Administered 2011-04-12: 4 via TOPICAL
  Filled 2011-04-12: qty 60

## 2011-04-12 MED ORDER — METOPROLOL SUCCINATE ER 25 MG PO TB24
25.0000 mg | ORAL_TABLET | Freq: Every day | ORAL | Status: DC
  Filled 2011-04-12: qty 1

## 2011-04-12 MED ORDER — SODIUM CHLORIDE 0.9 % IR SOLN
80.0000 mg | Status: AC
  Administered 2011-04-13: 80 mg
  Filled 2011-04-12: qty 2

## 2011-04-12 MED ORDER — CEFAZOLIN SODIUM 1-5 GM-% IV SOLN
1.0000 g | INTRAVENOUS | Status: AC
  Administered 2011-04-13: 1 g via INTRAVENOUS
  Filled 2011-04-12: qty 50

## 2011-04-12 MED ORDER — DIAZEPAM 5 MG PO TABS
5.0000 mg | ORAL_TABLET | ORAL | Status: AC
  Administered 2011-04-13: 5 mg via ORAL
  Filled 2011-04-12: qty 1

## 2011-04-12 MED ORDER — SODIUM CHLORIDE 0.45 % IV SOLN
INTRAVENOUS | Status: DC
  Administered 2011-04-13: 06:00:00 via INTRAVENOUS

## 2011-04-12 MED ORDER — SODIUM CHLORIDE 0.9 % IV SOLN
INTRAVENOUS | Status: DC
  Administered 2011-04-13: 06:00:00 via INTRAVENOUS

## 2011-04-12 MED ORDER — CHLORHEXIDINE GLUCONATE 4 % EX LIQD
60.0000 mL | Freq: Once | CUTANEOUS | Status: AC
  Administered 2011-04-13: 4 via TOPICAL
  Filled 2011-04-12: qty 60

## 2011-04-12 NOTE — Consults (Signed)
Electrophysiology Consult Note    Patient ID: Joy Newman MRN: [id number], DOB/AGE: 76-Jul-1929 76 y.o.  Admit date: 04/11/2011 Date of Consult: 04/12/2011  Primary Physician: Gaspar Garbe, MD, MD Primary Cardiologist: Nhaser  Chief Complaint: faints Reason for Consultation: pauses  HPI: 78 year old woman with new onset syncope followed by recurrent pauses.  She is a remote history of an anteroapical MI apparently associated with no obstructive coronary disease and a history of mitral valve prolapse with mild mitral regurgitation; prolapse was not confirmed on the ultrasound done today. She is taking Cardizem for years for hypertension since her "MI". She awakened of the other night with an unusual symptom where she had a distorted vision and Crystal like objects in her vision (See below) these were relatively short-lived. She got up and walked about without residua and went back to bed. When she awakened the morning she noted that her heartbeat was irregular. She went to her PCP noted something and increased her Cardizem by an extra dose. That evening, while talking on the phone and without apparent warning, she collapsed to the floor and was found a few moments later facedown with her arms stretched backwards. EMS was called. Her first awareness with some time in the ambulance. She was admitted.  She then had 2 further episodes Hospital both associated with meals. The first was about 10 seconds with breakfast the second about 7 seconds with lunch. The first was associated with PR prolongation prior to the pause. There were fixed PP intervals throughout. She then had other episodes where she has atrial bigeminy that might have been associated with modest hypotension.  She does not knowledge dizziness with the turning of her head. However, when I did carotid massage she had an 8 second pause. She also had a visual stimulation similar to what she described a couple of nights ago. .The  patient denies chest pain, shortness of breath, nocturnal dyspnea, orthopnea or peripheral edema.  There have been no palpitations, lightheadedness or syncope.    Past Medical History  Diagnosis Date  . Hyperlipidemia   . Diastolic dysfunction   . CAD (coronary artery disease) no obstruction at cath     Hx of with an anteroapical M.I  . Pulmonary hypertension      Hx of Mild  . Tricuspid regurgitation     Moderate regurgitation  . Kidney stones   . Arteriovenous malformation of colon   . Diverticulosis   . Carotid sinus hypersensitivity   . Syncope   . Hypertension       Surgical History:  Past Surgical History  Procedure Date  . Appendectomy     Hx of  . Tonsillectomy     Hx of  . Abdominal hysterectomy     Hx of  . Cardiac catheterization     Revealed an apical wall motion abnormality  . Cardiovascular stress test 02-21-2006    EF 84%, which revealed an apical defect     Prescriptions prior to admission  Medication Sig Dispense Refill  . Ascorbic Acid (VITAMIN C) 1000 MG tablet Take 1,000 mg by mouth daily.        Marland Kitchen aspirin 81 MG tablet Take 81 mg by mouth daily.       . Calcium 1500 MG tablet Take 1,500 mg by mouth 2 (two) times daily.        . cholecalciferol (VITAMIN D) 1000 UNITS tablet Take 2,000 Units by mouth daily.        Marland Kitchen diltiazem (TIAZAC) 120  MG 24 hr capsule Take 120 mg by mouth daily.        Marland Kitchen estrogens, conjugated, (PREMARIN) 0.3 MG tablet Take 0.3 mg by mouth daily.       . Multiple Vitamin (SUPER STRESS 600/BIOTIN PO) Take 3 tablets by mouth daily.        . Multiple Vitamins-Minerals (MULTIVITAMIN PO) Take 1 tablet by mouth daily.       . pantoprazole (PROTONIX) 40 MG tablet Take 40 mg by mouth daily.        Marland Kitchen triamterene-hydrochlorothiazide (MAXZIDE-25) 37.5-25 MG per tablet Take 0.5 tablets by mouth daily.          Inpatient Medications:     . aspirin  81 mg Oral Daily  . cholecalciferol  2,000 Units Oral Daily  . enoxaparin  40 mg  Subcutaneous Q24H  . estrogens (conjugated)  0.3 mg Oral Daily  . ondansetron (ZOFRAN) IV  4 mg Intravenous Once  . pantoprazole  40 mg Oral Q1200  . triamterene-hydrochlorothiazide  0.5 each Oral Daily  . DISCONTD: diltiazem  120 mg Oral Daily  . DISCONTD: metoprolol succinate  25 mg Oral Daily    Allergies:  Allergies  Allergen Reactions  . Aspirin     Intolerant and cause GI Bleed  . Imdur (Isosorbide Mononitrate)     Intolerant and cause headaches and nausea  . Quinine Derivatives     Increase in Heart Rate and Decrease in BP   . Tetracyclines & Related     Rash    History   Social History  . Marital Status: Widowed    Spouse Name: N/A    Number of Children: N/A  . Years of Education: N/A   Occupational History  . Not on file.   Social History Main Topics  . Smoking status: Never Smoker   . Smokeless tobacco: Never Used  . Alcohol Use: No     Rarely  . Drug Use: No  . Sexually Active: Not on file   Other Topics Concern  . Not on file   Social History Narrative   Retired Geologist, engineering.  Nonsmoker.  Nondrinker.  Lives alone.  Has adopted son.     Family History  Problem Relation Age of Onset  . Stroke Mother   . Kidney disease Father   . Cancer Brother   . Kidney disease Sister         Review of Systems:     Cardiac Review of Systems: {Y] = yes [ ]  = no  Chest Pain [    ]  Resting SOB [   ] Exertional SOB  [  ]  Orthopnea [  ]   Pedal Edema [   ]    Palpitations [ Y ] Syncope  [  ]   Presyncope [   ]  General Review of Systems: [Y] = yes [  ]=no Constitional: recent weight change [  ]; anorexia [  ]; fatigue [  ]; nausea [  Y]; night sweats [  ]; fever [  ]; or chills [  ];  Dental: poor dentition[  ];   Eye : blurred vision [  ]; diplopia [   ]; vision changes [  ];  Amaurosis fugax[  ]; Resp: cough [  ];   wheezing[  ];  hemoptysis[  ]; shortness of breath[  ]; paroxysmal nocturnal dyspnea[  ]; dyspnea on exertion[  ]; or orthopnea[  ];  GI:  gallstones[  ], vomiting[  ];  dysphagia[  ]; melena[  ];  hematochezia [  ]; heartburn[  ];   Hx of  Colonoscopy[  ]; GU: kidney stones [  ]; hematuria[  ];   dysuria [  ];  nocturia[  ];  history of     obstruction [  ];                 Skin: rash, swelling[  ];, hair loss[  ];  peripheral edema[  ];  or itching[  ]; Musculosketetal: myalgias[  ];  joint swelling[  ];  joint erythema[  ];  joint pain[Y-neck  ];  back pain[  ];  Heme/Lymph: bruising[  ];  bleeding[  ];  anemia[  ];  Neuro: TIA[  ];  headaches[  ];  stroke[  ];  vertigo[  ];  seizures[  ];   paresthesias[  ];  difficulty walking[  ];  Psych:depression[  ]; anxiety[  ];  Endocrine: diabetes[  ];  thyroid dysfunction[  ];  Immunizations: Flu [  ]; Pneumococcal[  ];  Physical Exam:  Blood pressure 118/42, pulse 79, temperature 97.3 F (36.3 C), temperature source Oral, resp. rate 17, height 5\' 2"  (1.575 m), weight 111 lb 14.4 oz (50.758 kg), SpO2 96.00%.  General appearance: alert, cooperative, appears stated age and no distress HEENT:PERRLA Neck: no adenopathy, no carotid bruit, no JVD, supple, symmetrical, trachea midline and thyroid not enlarged, symmetric, no tenderness/mass/nodules Back: negative, symmetric, no curvature. ROM normal. No CVA tenderness. Resp: clear to auscultation bilaterally Chest wall: no tenderness Cardio:regular rate & rhythm, no murmurs, S1 normal and S2 normal GI: soft, non-tender; bowel sounds normal; no masses,  no organomegaly Extremities: extremities normal, atraumatic, no cyanosis or edema Pulses: 2+ and symmetric Skin: Skin color, texture, turgor normal. No rashes or lesions Lymph nodes: Cervical, supraclavicular, and axillary nodes normal. Neuro:Alert and oriented x3. Gait normal. Reflexes and motor strength normal and symmetric. Cranial nerves 2-12 and  sensation grossly intact. Psychnormal affect   EKG: Dated yesterday demonstrates sinus rhythm at 80 with intervals of 0.20/0.08/0.40 is nonspecific ST-T changes inferolaterally We'll echocardiogram dated January 8 for a primary care office demonstrated sinus rhythm with PVCs and PACs.  Basic Metabolic Panel:  Lab 04/11/11 1610 04/11/11 1836  NA 137 135  K 3.7 3.7  CL 95* 94*  CO2 31 31  GLUCOSE 113* 113*  BUN 7 8  CREATININE 0.74 0.68  CALCIUM 9.7 9.6  MG -- 1.9  PHOS -- --   Cardiac Enzymes:  Basename 04/12/11 0615 04/11/11 2223  CKTOTAL 138 140  CKMB 3.2 3.7  CKMBINDEX -- --  TROPONINI <0.30 <0.30   CBC:  Lab 04/11/11 2223 04/11/11 1836  WBC 12.1* 10.9*  NEUTROABS 9.4* --  HGB 13.6 14.1  HCT 40.1 41.3  MCV 99.0 98.6  PLT 256 259    Liver Function Tests:  Basename 04/11/11 2223  AST 22  ALT 15  ALKPHOS 73  BILITOT 0.4  PROT 6.6  ALBUMIN 3.7    Basename 04/11/11 2223  TSH 1.707  T4TOTAL --  T3FREE --  THYROIDAB --   ASSESSMENT and PLAN  The patient had recurrent episodes of documented complete heart block associated with EEG. She also had an antecedent episode of syncope unassociated with eating. Interestingly there was PR prolongation prior to the first episode of documented heart block suggesting a block occurred in the A-V node which would make autonomic input a likely culprit. Eating and the nausea and vomiting that accompanied it may have served as the trigger.  This does not explain the episode the night before and the almost certainly are related. Carotid sinus massage was markedly abnormal with an 8 second pause and the associated symptoms of visual distortion were not unlike what she had sensed the night before when she awakened at sleep suggesting that this preceded to further addition of her Cardizem.  There is no clear trigger identified but we need to be concerned as to a possible GI issue that is serving in this capacity, i.e. at the nausea  and vomiting triggered the heart block as opposed to be an accompanying symptom of the heart block. However, with a carotid sinus hypersensitivity and the syncope the possibility that there was neck manipulation is a consequence of the nausea as well as on the telephone created possible unifying diagnosis and I have recommended to the family that we undertake pacing. I have reviewed with him the potential benefits and risks including but not limited to death perforation of the heart and/or lung lead dislodgment and infection they understand these risks and are willing to proceed.  There has been a lot of discussion about mitral and tricuspid valve disease. These were minimally abnormal on today's echo. In addition there was no comment made as to the presence of mitral valve prolapse.  We have talked about the potential vasomotor contribution to her syncope. As such she will get a pacemaker that allows for response to changes in hemodynamic status is a Medtronic rate drop or [employer]ron[employer]'s CLS.  I'm not sure that the PVCs and PACs she has had to play any role in her syncope; and apart from being aware of them I'm not sure that they play other rales either. She did have a fair amount of them however on her ECG and we need to keep this issue in mind       Patient Active Hospital Problem List: Syncope (04/11/2011)   Heart block AV complete (04/12/2011)  Carotid sinus hypersensitivity  PAC/PVCs        Signed, Sherryl Manges MD

## 2011-04-12 NOTE — Progress Notes (Signed)
Pt had 2 pauses of 4.81 then one beat followed by another pause of 4.78 with heart block. Pt reported more nausea and funny feeling in chest. BP 122/72. Heart rate spontaneously converted back to SR in 70's. Ward Givens NP notified. Will cont to monitor. Pt currently still has zoll at bedside with pads on her.

## 2011-04-12 NOTE — ED Provider Notes (Signed)
I saw and evaluated the patient, reviewed the resident's note and I agree with the findings and plan.   Lynita Groseclose A. Arian Murley, MD 04/12/11 1453 

## 2011-04-12 NOTE — Progress Notes (Addendum)
Pt had a 10.16 second pause with some bradycardia/heart block in 30's. Pt reported feeling nauseous and grabbing her head. She spontaneously resolved back SR in the 70's. BP was 16109. [facility] [place] [place] notified. Instructed RN to place pads on patient and place zoll at bedside which was done. Will continue to monitor.

## 2011-04-12 NOTE — Progress Notes (Signed)
TRANSFERRED-IN FROM 3700 BY BED AWAKE AND ALERT, HOOKED UP TO THE ZOLL. DENIED ANY DISCOMFORT. CONTINUE TO MONITOR.

## 2011-04-12 NOTE — Progress Notes (Signed)
  Echocardiogram 2D Echocardiogram has been performed.  Jorje Guild [facility] 04/12/2011, 10:38 AM

## 2011-04-12 NOTE — Progress Notes (Signed)
I transferred [name] to room 2922. I gave report to the stepdown nurse assigned to her. Transfer was successful with no complications.

## 2011-04-12 NOTE — Progress Notes (Signed)
Patient Name: Joy Newman Date of Encounter: 04/12/2011     Principal Problem:  *Syncope Active Problems:  Palpitations  Hyperlipidemia  Mitral valve prolapse  Symptomatic bradycardia  Heart block AV complete    SUBJECTIVE:  CTSP 2/2 recurrent presyncope in the setting of prolonged heart block.  Pt currently in sinus rhythm with 1st degree avb and asymptomatic but has now had multiple symptomatic pauses today with evidence of high grade HB.  Pt says she got up yesterday am feeling poorly and took her usual meds.  After this, she felt like her vision was off, " i was seeing stars."  She called 911 and was seen by paramedics.  "my heart rate was erratic" when seen by paramedics and pt saw pcp (Dr. Clelia Croft).  EKG from that visit is in hard copy of chart and reveals RSR, 80, borderline 1st degree avb, pac's, and pvc's.  Pt was advised to increase her home dose of Dilt from 120 to 240 daily.  She went home and took an additional 120mg  of Dilt (had already taken 120 that am) and some time later, while talking on the phone, she had sudden syncope.  She is not aware of any prodromal Ss.  Her son found her face down on the floor, breathing heavily.  EMS was again called, and pt regained consciousness by the time they arrived.  She had no events overnight but this am, has had multiple presyncopal spells in the setting of prolonged pauses.  She has not received Diltiazem since her home dose @ ~ 1:30 yesterday afternoon.  No obvious lab findings to explain arrhythmia.   CURRENT MEDS    . aspirin  81 mg Oral Daily  . cholecalciferol  2,000 Units Oral Daily  . enoxaparin  40 mg Subcutaneous Q24H  . estrogens (conjugated)  0.3 mg Oral Daily  . ondansetron (ZOFRAN) IV  4 mg Intravenous Once  . pantoprazole  40 mg Oral Q1200  . triamterene-hydrochlorothiazide  0.5 each Oral Daily    OBJECTIVE  Filed Vitals:   04/12/11 0646 04/12/11 0651 04/12/11 0847 04/12/11 1255  BP: 76/48 104/57  123/53 122/72  Pulse: 85 90 68 70  Temp:      TempSrc:      Resp:      Height:      Weight:      SpO2:        Intake/Output Summary (Last 24 hours) at 04/12/11 1331 Last data filed at 04/11/11 1948  Gross per 24 hour  Intake    200 ml  Output      0 ml  Net    200 ml   Weight change:   PHYSICAL EXAM  General: Well developed, well nourished, in no acute distress. Head: Normocephalic, atraumatic, sclera non-icteric, no xanthomas, nares are without discharge.  Neck: Supple without bruits or JVD. Lungs:  Resp regular and unlabored, CTA. Heart: RRR no s3, s4, or murmurs. Abdomen: Soft, non-tender, non-distended, BS + x 4.  Msk:  Strength and tone appears normal for age. Extremities: No clubbing, cyanosis or edema. DP/PT/Radials 2+ and equal bilaterally. Neuro: Alert and oriented X 3. Moves all extremities spontaneously. Psych: Normal affect.  LABS:  CBC:  Basename 04/11/11 2223 04/11/11 1836  WBC 12.1* 10.9*  NEUTROABS 9.4* --  HGB 13.6 14.1  HCT 40.1 41.3  MCV 99.0 98.6  PLT 256 259   Basic Metabolic Panel:  Basename 04/11/11 2223 04/11/11 1836  NA 137 135  K 3.7 3.7  CL 95* 94*  CO2 31 31  GLUCOSE 113* 113*  BUN 7 8  CREATININE 0.74 0.68  CALCIUM 9.7 9.6  MG -- 1.9  PHOS -- --   Liver Function Tests:  [facility] 04/11/11 2223  AST 22  ALT 15  ALKPHOS 73  BILITOT 0.4  PROT 6.6  ALBUMIN 3.7  Cardiac Enzymes:  Basename 04/12/11 0615 04/11/11 2223  CKTOTAL 138 140  CKMB 3.2 3.7  CKMBINDEX -- --  TROPONINI <0.30 <0.30   Thyroid Function Tests:  Basename 04/11/11 2223  TSH 1.707  T4TOTAL --  T3FREE --  THYROIDAB --     TELE  RSR, 1st degree avb w/ pr of ~ .  She then developed widening of pr, consistent with wenckebach - with dropped qrs, followed by complete heart block x ~ 6 secs and then an additional 4 secs with presyncope.  ASSESSMENT AND PLAN:  1.  Symptomatic bradycardia/High grade Heart Block/Syncope:  Reviewed with Dr.  Elease Hashimoto.  Will discuss with EP.  Keep npo for now.  tx to stepdown.    Signed, Nicolasa Ducking NP

## 2011-04-12 NOTE — Progress Notes (Signed)
Subjective:   Joy Newman is an 78 yo with hx of MVP, CP, ( normal coronaries but with a Takasubo syndrome like presentation in 2004 ) admitted 04/11/11 after having an episode of syncope.  She had been complaining of palpitations and was scheduled to see me in the office this am at 9.  She was seen by her primary MD and her dose of Diltiazem was doubled.  I do not have her HR at the time of that office visit.  She feels better. No further episodes of syncope.      Marland Kitchen aspirin  81 mg Oral Daily  . cholecalciferol  2,000 Units Oral Daily  . diltiazem  120 mg Oral Daily  . enoxaparin  40 mg Subcutaneous Q24H  . estrogens (conjugated)  0.3 mg Oral Daily  . ondansetron (ZOFRAN) IV  4 mg Intravenous Once  . pantoprazole  40 mg Oral Q1200  . triamterene-hydrochlorothiazide  0.5 each Oral Daily      Objective:  Vital Signs in the last 24 hours: Blood pressure 104/57, pulse 90, temperature 98.3 F (36.8 C), temperature source Oral, resp. rate 18, height 5\' 2"  (1.575 m), weight 111 lb 14.4 oz (50.758 kg), SpO2 96.00%. Temp:  [97.1 F (36.2 C)-98.3 F (36.8 C)] 98.3 F (36.8 C) (01/09 0500) Pulse Rate:  [67-92] 90  (01/09 0651) Resp:  [14-21] 18  (01/09 0500) BP: (76-136)/(48-73) 104/57 mmHg (01/09 0651) SpO2:  [95 %-98 %] 96 % (01/09 0500) Weight:  [111 lb 14.4 oz (50.758 kg)] 111 lb 14.4 oz (50.758 kg) (01/09 0016)  Intake/Output from previous day: 01/08 0701 - 01/09 0700 In: 200 [I.V.:200] Out: -  Intake/Output from this shift:    Physical Exam: The patient is alert and oriented x 3.  The mood and affect are normal.   Skin: warm and dry.  Color is normal.    HEENT:   the sclera are nonicteric.  The mucous membranes are moist.  The carotids are 2+ without bruits.  There is no thyromegaly.  There is no JVD.    Lungs: clear.  The chest wall is non tender.    Heart: regular rate with a normal S1 and S2.  She has a mid systolic click with a soft murmur.  Abdomen: good bowel  sounds.  There is no guarding or rebound.  There is no hepatosplenomegaly or tenderness.  There are no masses.   Extremities:  no clubbing, cyanosis, or edema.  The legs are without rashes.  The distal pulses are intact.   Neuro:  Cranial nerves II - XII are intact.  Motor and sensory functions are intact.     Lab Results:  [facility] 04/11/11 2223 04/11/11 1836  WBC 12.1* 10.9*  HGB 13.6 14.1  PLT 256 259    Basename 04/11/11 2223 04/11/11 1836  NA 137 135  K 3.7 3.7  CL 95* 94*  CO2 31 31  GLUCOSE 113* 113*  BUN 7 8  CREATININE 0.74 0.68    Basename 04/12/11 0615 04/11/11 2223  TROPONINI <0.30 <0.30   No results found for this basename: BNP in the last 72 hours Hepatic Function Panel  Basename 04/11/11 2223  PROT 6.6  ALBUMIN 3.7  AST 22  ALT 15  ALKPHOS 73  BILITOT 0.4  BILIDIR --  IBILI --   No results found for this basename: CHOL, HDL, LDLCALC, LDLDIRECT, TRIG, CHOLHDL    Basename 04/11/11 2223  INR 0.91   Tele:  NSR  Assessment/Plan:  1.  Syncope:  Following an increase in her Diltiazem dose.  May be due to orthostasis vs. Transient heart block.  HR and PR interval are normal this AM.  Her BP was very low this AM.   Echo this AM.  Ambulate in hall this am.  Will hold Dilt and try Metoprolol XL 25 QD.   Disposition: home tomorrow if she does well.  Joy Newman, Joy Newman., MD, [facility] 04/12/2011, 7:52 AM LOS: Day 1

## 2011-04-13 ENCOUNTER — Encounter (HOSPITAL_COMMUNITY)
Admission: EM | Disposition: A | Discharge status: Home / Self Care | Payer: Self-pay | Source: Home / Self Care | Attending: Cardiovascular Disease

## 2011-04-13 DIAGNOSIS — I2789 Other specified pulmonary heart diseases: Secondary | ICD-10-CM | POA: Diagnosis present

## 2011-04-13 DIAGNOSIS — I079 Rheumatic tricuspid valve disease, unspecified: Secondary | ICD-10-CM | POA: Diagnosis present

## 2011-04-13 DIAGNOSIS — I440 Atrioventricular block, first degree: Secondary | ICD-10-CM | POA: Diagnosis present

## 2011-04-13 DIAGNOSIS — I2510 Atherosclerotic heart disease of native coronary artery without angina pectoris: Secondary | ICD-10-CM | POA: Diagnosis present

## 2011-04-13 DIAGNOSIS — I442 Atrioventricular block, complete: Secondary | ICD-10-CM | POA: Diagnosis present

## 2011-04-13 DIAGNOSIS — I498 Other specified cardiac arrhythmias: Secondary | ICD-10-CM | POA: Diagnosis not present

## 2011-04-13 DIAGNOSIS — Z950 Presence of cardiac pacemaker: Secondary | ICD-10-CM | POA: Diagnosis not present

## 2011-04-13 DIAGNOSIS — I10 Essential (primary) hypertension: Secondary | ICD-10-CM | POA: Diagnosis present

## 2011-04-13 DIAGNOSIS — I252 Old myocardial infarction: Secondary | ICD-10-CM | POA: Diagnosis not present

## 2011-04-13 DIAGNOSIS — I495 Sick sinus syndrome: Secondary | ICD-10-CM | POA: Diagnosis present

## 2011-04-13 DIAGNOSIS — J984 Other disorders of lung: Secondary | ICD-10-CM | POA: Diagnosis not present

## 2011-04-13 DIAGNOSIS — R911 Solitary pulmonary nodule: Secondary | ICD-10-CM | POA: Diagnosis not present

## 2011-04-13 DIAGNOSIS — E785 Hyperlipidemia, unspecified: Secondary | ICD-10-CM | POA: Diagnosis present

## 2011-04-13 DIAGNOSIS — G9001 Carotid sinus syncope: Secondary | ICD-10-CM | POA: Diagnosis present

## 2011-04-13 DIAGNOSIS — R55 Syncope and collapse: Secondary | ICD-10-CM | POA: Diagnosis not present

## 2011-04-13 DIAGNOSIS — Z7982 Long term (current) use of aspirin: Secondary | ICD-10-CM | POA: Diagnosis not present

## 2011-04-13 DIAGNOSIS — I059 Rheumatic mitral valve disease, unspecified: Secondary | ICD-10-CM | POA: Diagnosis present

## 2011-04-13 DIAGNOSIS — Z79899 Other long term (current) drug therapy: Secondary | ICD-10-CM | POA: Diagnosis not present

## 2011-04-13 HISTORY — PX: PERMANENT PACEMAKER INSERTION: SHX5480

## 2011-04-13 SURGERY — PERMANENT PACEMAKER INSERTION
Anesthesia: LOCAL

## 2011-04-13 MED ORDER — HEPARIN (PORCINE) IN NACL 2-0.9 UNIT/ML-% IJ SOLN
INTRAMUSCULAR | Status: AC
  Filled 2011-04-13: qty 1000

## 2011-04-13 MED ORDER — FENTANYL CITRATE 0.05 MG/ML IJ SOLN
INTRAMUSCULAR | Status: AC
  Filled 2011-04-13: qty 2

## 2011-04-13 MED ORDER — ACETAMINOPHEN 325 MG PO TABS: 325.0000 mg | ORAL_TABLET | Freq: Four times a day (QID) | ORAL | Status: DC | PRN

## 2011-04-13 MED ORDER — CEFAZOLIN SODIUM 1-5 GM-% IV SOLN
1.0000 g | Freq: Four times a day (QID) | INTRAVENOUS | Status: AC
  Administered 2011-04-13 – 2011-04-14 (×3): 1 g via INTRAVENOUS
  Filled 2011-04-13 (×3): qty 50

## 2011-04-13 MED ORDER — ONDANSETRON HCL 4 MG/2ML IJ SOLN: 4.0000 mg | Freq: Four times a day (QID) | INTRAMUSCULAR | Status: DC | PRN

## 2011-04-13 MED ORDER — LIDOCAINE HCL (PF) 1 % IJ SOLN
INTRAMUSCULAR | Status: AC
  Filled 2011-04-13: qty 60

## 2011-04-13 MED ORDER — MIDAZOLAM HCL 5 MG/5ML IJ SOLN
INTRAMUSCULAR | Status: AC
  Filled 2011-04-13: qty 5

## 2011-04-13 MED ORDER — SODIUM CHLORIDE 0.9 % IV SOLN
250.0000 mL | INTRAVENOUS | Status: AC
  Administered 2011-04-13: 250 mL via INTRAVENOUS

## 2011-04-13 MED ORDER — ACETAMINOPHEN 325 MG PO TABS: 325.0000 mg | ORAL_TABLET | ORAL | Status: DC | PRN

## 2011-04-13 NOTE — Interval H&P Note (Signed)
History and Physical Interval Note:  04/13/2011 4:34 PM  Joy Newman  has presented today for surgery, with the diagnosis of Bradycardia  The various methods of treatment have been discussed with the patient and family. After consideration of risks, benefits and other options for treatment, the patient has consented to  Procedure(s): PERMANENT PACEMAKER INSERTION as a surgical intervention .  The patients' history has been reviewed, patient examined, no change in status, stable for surgery.  I have reviewed the patients' chart and labs.  Questions were answered to the patient's satisfaction.     Lewayne Bunting

## 2011-04-13 NOTE — H&P (View-Only) (Signed)
 Patient Name: Joy Newman Date of Encounter: 04/12/2011     Principal Problem:  *Syncope Active Problems:  Palpitations  Hyperlipidemia  Mitral valve prolapse  Symptomatic bradycardia  Heart block AV complete    SUBJECTIVE:  CTSP 2/2 recurrent presyncope in the setting of prolonged heart block.  Pt currently in sinus rhythm with 1st degree avb and asymptomatic but has now had multiple symptomatic pauses today with evidence of high grade HB.  Pt says she got up yesterday am feeling poorly and took her usual meds.  After this, she felt like her vision was off, " i was seeing stars."  She called 911 and was seen by paramedics.  "my heart rate was erratic" when seen by paramedics and pt saw pcp (Dr. Shaw).  EKG from that visit is in hard copy of chart and reveals RSR, 80, borderline 1st degree avb, pac's, and pvc's.  Pt was advised to increase her home dose of Dilt from 120 to 240 daily.  She went home and took an additional 120mg of Dilt (had already taken 120 that am) and some time later, while talking on the phone, she had sudden syncope.  She is not aware of any prodromal Ss.  Her son found her face down on the floor, breathing heavily.  EMS was again called, and pt regained consciousness by the time they arrived.  She had no events overnight but this am, has had multiple presyncopal spells in the setting of prolonged pauses.  She has not received Diltiazem since her home dose @ ~ 1:30 yesterday afternoon.  No obvious lab findings to explain arrhythmia.   CURRENT MEDS    . aspirin  81 mg Oral Daily  . cholecalciferol  2,000 Units Oral Daily  . enoxaparin  40 mg Subcutaneous Q24H  . estrogens (conjugated)  0.3 mg Oral Daily  . ondansetron (ZOFRAN) IV  4 mg Intravenous Once  . pantoprazole  40 mg Oral Q1200  . triamterene-hydrochlorothiazide  0.5 each Oral Daily    OBJECTIVE  Filed Vitals:   04/12/11 0646 04/12/11 0651 04/12/11 0847 04/12/11 1255  BP: 76/48 104/57  123/53 122/72  Pulse: 85 90 68 70  Temp:      TempSrc:      Resp:      Height:      Weight:      SpO2:        Intake/Output Summary (Last 24 hours) at 04/12/11 1331 Last data filed at 04/11/11 1948  Gross per 24 hour  Intake    200 ml  Output      0 ml  Net    200 ml   Weight change:   PHYSICAL EXAM  General: Well developed, well nourished, in no acute distress. Head: Normocephalic, atraumatic, sclera non-icteric, no xanthomas, nares are without discharge.  Neck: Supple without bruits or JVD. Lungs:  Resp regular and unlabored, CTA. Heart: RRR no s3, s4, or murmurs. Abdomen: Soft, non-tender, non-distended, BS + x 4.  Msk:  Strength and tone appears normal for age. Extremities: No clubbing, cyanosis or edema. DP/PT/Radials 2+ and equal bilaterally. Neuro: Alert and oriented X 3. Moves all extremities spontaneously. Psych: Normal affect.  LABS:  CBC:  Basename 04/11/11 2223 04/11/11 1836  WBC 12.1* 10.9*  NEUTROABS 9.4* --  HGB 13.6 14.1  HCT 40.1 41.3  MCV 99.0 98.6  PLT 256 259   Basic Metabolic Panel:  Basename 04/11/11 2223 04/11/11 1836  NA 137 135  K 3.7 3.7    CL 95* 94*  CO2 31 31  GLUCOSE 113* 113*  BUN 7 8  CREATININE 0.74 0.68  CALCIUM 9.7 9.6  MG -- 1.9  PHOS -- --   Liver Function Tests:  Basename 04/11/11 2223  AST 22  ALT 15  ALKPHOS 73  BILITOT 0.4  PROT 6.6  ALBUMIN 3.7  Cardiac Enzymes:  Basename 04/12/11 0615 04/11/11 2223  CKTOTAL 138 140  CKMB 3.2 3.7  CKMBINDEX -- --  TROPONINI <0.30 <0.30   Thyroid Function Tests:  Basename 04/11/11 2223  TSH 1.707  T4TOTAL --  T3FREE --  THYROIDAB --     TELE  RSR, 1st degree avb w/ pr of ~ 220msec.  She then developed widening of pr, consistent with wenckebach - with dropped qrs, followed by complete heart block x ~ 6 secs and then an additional 4 secs with presyncope.  ASSESSMENT AND PLAN:  1.  Symptomatic bradycardia/High grade Heart Block/Syncope:  Reviewed with Dr.  Nahser.  Will discuss with EP.  Keep npo for now.  tx to stepdown.    Signed, Khairi Garman NP  

## 2011-04-13 NOTE — Op Note (Signed)
DDD PPM implanted via left subclavian vein. O#[id number].

## 2011-04-14 ENCOUNTER — Inpatient Hospital Stay (HOSPITAL_COMMUNITY): Payer: Medicare Other

## 2011-04-14 ENCOUNTER — Other Ambulatory Visit: Payer: Self-pay

## 2011-04-14 DIAGNOSIS — R911 Solitary pulmonary nodule: Secondary | ICD-10-CM | POA: Diagnosis not present

## 2011-04-14 DIAGNOSIS — J984 Other disorders of lung: Secondary | ICD-10-CM | POA: Diagnosis not present

## 2011-04-14 DIAGNOSIS — Z950 Presence of cardiac pacemaker: Secondary | ICD-10-CM | POA: Diagnosis not present

## 2011-04-14 NOTE — Progress Notes (Signed)
  Patient Name: Joy Newman      SUBJECTIVE: Patient is status post pacemaker implantation for syncope pauses and carotid sinus hypersensitivity. She is without significant chest pain and no shortness of breath Past Medical History  Diagnosis Date  . Hyperlipidemia   . Diastolic dysfunction   . CAD (coronary artery disease) no obstruction at cath     Hx of with an anteroapical M.I  . Pulmonary hypertension      Hx of Mild  . Tricuspid regurgitation     Moderate regurgitation  . Kidney stones   . Arteriovenous malformation of colon   . Diverticulosis   . Carotid sinus hypersensitivity   . Syncope   . Hypertension   . PVC and PACs     PHYSICAL EXAM Filed Vitals:   04/14/11 0100 04/14/11 0200 04/14/11 0300 04/14/11 0500  BP: 108/57 110/54 117/62 130/56  Pulse: 68 71 69 80  Temp:    97.5 F (36.4 C)  TempSrc:    Oral  Resp:    20  Height:      Weight:      SpO2:    95%    Well developed and nourished in no acute distress HENT normal Neck supple with JVP-flat Carotids brisk and full without bruits Without hematoma or tenderness Clear Regular rate and rhythm, no murmurs or gallops Abd-soft with active BS without hepatomegaly No Clubbing cyanosis edema Skin-warm and dry A & Oriented  Grossly normal sensory and motor function  TELEMETRY: Reviewed telemetry pt in nsr:    Intake/Output Summary (Last 24 hours) at 04/14/11 0757 Last data filed at 04/13/11 1400  Gross per 24 hour  Intake    700 ml  Output    800 ml  Net   -100 ml    LABS: Basic Metabolic Panel:  Lab 04/11/11 7829 04/11/11 1836  NA 137 135  K 3.7 3.7  CL 95* 94*  CO2 31 31  GLUCOSE 113* 113*  BUN 7 8  CREATININE 0.74 0.68  CALCIUM 9.7 9.6  MG -- 1.9  PHOS -- --   Cardiac Enzymes:  Basename 04/12/11 0615 04/11/11 2223  CKTOTAL 138 140  CKMB 3.2 3.7  CKMBINDEX -- --  TROPONINI <0.30 <0.30   CBC:  Lab 04/11/11 2223 04/11/11 1836  WBC 12.1* 10.9*  NEUTROABS 9.4* --    HGB 13.6 14.1  HCT 40.1 41.3  MCV 99.0 98.6  PLT 256 259   PROTIME:  Basename 04/11/11 2223  LABPROT 12.5  INR 0.91   Liver Function Tests:  Basename 04/11/11 2223  AST 22  ALT 15  ALKPHOS 73  BILITOT 0.4  PROT 6.6  ALBUMIN 3.7      Device Interrogation  normal device function  ASSESSMENT AND PLAN:  Patient Active Hospital Problem List: Syncope (04/11/2011)   Assessment: No recurrent syncope. Will anticipate discharge today. No driving for 6 weeks.    Plan:  Carotid sinus hypersensitivity ()   Assessment: As above   Hypertension ()   Assessment: Reasonably controlled    Plan:      Discharge today   F/u wound clininc 10-14 days F/u SK 68month F/u 2wk R Tisovec   Signed, Sherryl Manges MD  04/14/2011

## 2011-04-14 NOTE — Discharge Summary (Signed)
CARDIOLOGY DISCHARGE SUMMARY   Patient ID: Joy Newman MRN: [id number] DOB/AGE: 09-18-1927 78 y.o.  Admit date: 04/11/2011 Discharge date: 04/14/2011  Primary Discharge Diagnosis:  *Syncope Secondary Discharge Diagnosis:  Patient Active Problem List  Diagnoses  . Syncope  . Palpitations  . Hyperlipidemia  . Diastolic dysfunction  . Mitral valve prolapse  . Tricuspid regurgitation  . History of Arteriovenous malformation of colon  . Carotid sinus hypersensitivity  . Hypertension  . PVC and PACs  . Atrioventricular block, complete   Procedures: Insertion of a St. Jude Accent DRRF dual-chamber pacemaker, serial [id number], two-view chest x-ray, 2-D echocardiogram   Hospital Course: [name] is an 78 year old female with a history of mitral valve prolapse. She has no history of critical coronary artery disease. She had nausea and palpitations. Her diltiazem was increased but after that she had nausea, chest pain and a syncopal episode. She was transported by EMS to the hospital. She was admitted for further evaluation and treatment.  On telemetry, she had high-grade heart block with an underlying first-degree block. She also had symptomatic pauses. She had a 10.16 second pause. Some of the other pauses were almost 5 seconds long. Her cardiac enzymes were negative for MI. An echocardiogram showed a mildly calcified mitral valve annulus and a preserved EF. An EP consult was called and she was seen by Dr. Graciela Husbands. He recommended pacemaker insertion. She went to the lab for the procedure on 04/13/2011.  [name] had a St. Jude dual lead pacemaker inserted without complication. Her followup chest x-ray showed no pneumothorax. She had no further episodes of chest pain. She was maintaining sinus rhythm. On 04/14/2011 she was evaluated by Dr. Graciela Husbands and considered stable for discharge, to followup as an outpatient.  Labs:   Lab Results  Component Value Date   WBC  12.1* 04/11/2011   HGB 13.6 04/11/2011   HCT 40.1 04/11/2011   MCV 99.0 04/11/2011   PLT 256 04/11/2011    Lab 04/11/11 2223  NA 137  K 3.7  CL 95*  CO2 31  BUN 7  CREATININE 0.74  CALCIUM 9.7  PROT 6.6  BILITOT 0.4  ALKPHOS 73  ALT 15  AST 22  GLUCOSE 113*    Basename 04/12/11 0615 04/11/11 2223  CKTOTAL 138 140  CKMB 3.2 3.7  CKMBINDEX -- --  TROPONINI <0.30 <0.30     Radiology: CHEST - 2 VIEW  Comparison: 04/11/2011  Findings: Two views of the chest were obtained. There is now a left-sided dual lead cardiac pacemaker. Leads in the region of the right atrium and right ventricle. No evidence to suggest a pneumothorax. There are patchy densities in the lung apices which most likely represent scarring. Questionable nodular density overlying the left first rib probably represents overlying shadows and measures 6-7 mm. There is no focal airspace disease or edema. Heart size is normal. Surgical clips in the right lateral chest.  IMPRESSION: Placement of a left-sided cardiac pacemaker without pneumothorax.  Apical lung scarring and nodularity. This area could be followed with chest radiograph in 3-6 months to ensure stability.  Echo: 04/12/2011 Study Conclusions  - Left ventricle: The cavity size was normal. Wall thickness was normal. Systolic function was normal. The estimated ejection fraction was in the range of 60% to 65%. Wall motion was normal; there were no regional wall motion abnormalities. Left ventricular diastolic function parameters were normal. - Mitral valve: Mildly calcified annulus. - Pulmonary arteries: PA peak pressure: 32mm Hg (S).  FOLLOW UP PLANS AND APPOINTMENTS Discharge Orders    Future Appointments: Provider: Department: Dept Phone: Center:   04/27/2011 11:30 AM Vella Kohler [facility] [phone] [facility]  Wound check at [facility] cardiology on January 24, 11:30 AM    Current Discharge Medication List    CONTINUE these  medications which have NOT CHANGED   Details  Ascorbic Acid (VITAMIN C) 1000 MG tablet Take 1,000 mg by mouth daily.      aspirin 81 MG tablet Take 81 mg by mouth daily.     Calcium 1500 MG tablet Take 1,500 mg by mouth 2 (two) times daily.      cholecalciferol (VITAMIN D) 1000 UNITS tablet Take 2,000 Units by mouth daily.      diltiazem (TIAZAC) 120 MG 24 hr capsule Take 120 mg by mouth daily.      estrogens, conjugated, (PREMARIN) 0.3 MG tablet Take 0.3 mg by mouth daily.     Multiple Vitamin (SUPER STRESS 600/BIOTIN PO) Take 3 tablets by mouth daily.      Multiple Vitamins-Minerals (MULTIVITAMIN PO) Take 1 tablet by mouth daily.     pantoprazole (PROTONIX) 40 MG tablet Take 40 mg by mouth daily.      triamterene-hydrochlorothiazide (MAXZIDE-25) 37.5-25 MG per tablet Take 0.5 tablets by mouth daily.         Follow-up Information    Follow up with Sherryl Manges, MD in 3 months. (The office will call)    Contact information:   [address], [address] [place] [address] [phone]       Follow up with Gaspar Garbe, MD. Schedule an appointment as soon as possible for a visit in 2 weeks.   Contact information:   [address] [facility] [employer], [place]. [place] [address] [phone]       Follow up with Elyn Aquas., MD. (As scheduled)    Contact information:   [address] [place] [address] [phone]          BRING ALL MEDICATIONS WITH YOU TO FOLLOW UP APPOINTMENTS  Time spent with patient to include physician time: Signed: Theodore Demark 04/14/2011, 2:22 PM Co-Sign MD

## 2011-04-14 NOTE — Op Note (Signed)
NAMEMAHIKA, VANVOORHIS       ACCOUNT NO.:  [id number]  MEDICAL RECORD NO.:  [id number]  LOCATION:  3709                         FACILITY:  [facility]  PHYSICIAN:  Doylene Canning. Ladona Ridgel, MD    DATE OF BIRTH:  January 27, 1928  DATE OF PROCEDURE:  04/13/2011 DATE OF DISCHARGE:                              OPERATIVE REPORT   PROCEDURE PERFORMED:  Insertion of a dual-chamber pacemaker.  INDICATION:  Symptomatic bradycardia.  INTRODUCTION:  The patient is an 78 year old woman who presented to the hospital with syncope and she was subsequently found to have pauses of over 10 seconds.  She is now referred for permanent pacemaker insertion.  PROCEDURE IN DETAIL:  After informed consent was obtained, the patient was taken to the diagnostic EP lab in a fasting state.  After usual preparation and draping, intravenous fentanyl and midazolam was given for sedation.  Lidocaine 30% was infiltrated into the left infraclavicular region.  A 5-cm incision was carried out over this region.  Electrocautery was utilized to dissect down the fascial plane. Initial attempts to puncture the left subclavian vein were unsuccessful. A 10 mL of contrast was then injected into the left upper extremity venous system demonstrating that it was in fact patent.  It was somewhat more cephalad than expected.  The vein was then punctured x2 and the St. Jude model 2088T 52 cm active fixation pacing lead was advanced into the right ventricle.  Mapping was carried out at the final site.  The R-wave was measured at 15 mV, the pacing impedance was 600 ohms, and the threshold was less than a volt at 0.5 msec.  There was a large injury current.  No diaphragmatic stimulation was noted at 10 V.  With the ventricular lead in satisfactory position, attention then turned to placement of the atrial lead which was placed in anterolateral portion of the right atrium where P-waves measured 3 mV.  The pacing threshold was around 1 V at 0.4 msec.   A 10 V pacing did not stimulate the diaphragm.  With both the atrial and the RV leads in satisfactory position, they were secured to the subpectoral fascia with a figure-of- eight silk suture.  Sewing sleeve was secured with silk suture. Electrocautery was utilized to make subcutaneous pocket.  Antibiotic irrigation was utilized to irrigate the pocket.  Electrocautery was utilized to assure hemostasis.  The St. Jude Accent DRRF dual-chamber pacemaker, serial [id number] was connected to the atrial and RV leads and placed back in the subcutaneous pocket.  The generator was secured with silk suture.  Antibiotic irrigation was utilized to irrigate the pocket and electrocautery was utilized to assure hemostasis.  The incision was closed with 2-0 and 3-0 Vicryl.  Benzoin and Steri-Strips were painted on the skin, a pressure dressing was applied, and the patient was returned to her room in satisfactory condition.  COMPLICATIONS:  There were no immediate procedure complications.  RESULTS:  This demonstrates successful implantation of a St. Jude dual- chamber pacemaker in a patient with symptomatic bradycardia and pauses of over 10 seconds.     Doylene Canning. Ladona Ridgel, MD     GWT/MEDQ  D:  04/13/2011  T:  04/14/2011  Job:  [id number]

## 2011-04-17 NOTE — Discharge Summary (Signed)
S/p pacer for syncope sinus pauses and carotid sinus hypersensitiviy

## 2011-04-18 ENCOUNTER — Telehealth: Payer: Self-pay | Admitting: Cardiovascular Disease

## 2011-04-18 NOTE — Telephone Encounter (Signed)
Pt missed appt 04-12-11 was in hospital, pt wants to know when she need to follow back up with him?

## 2011-04-27 ENCOUNTER — Ambulatory Visit (INDEPENDENT_AMBULATORY_CARE_PROVIDER_SITE_OTHER): Payer: Medicare Other | Admitting: *Deleted

## 2011-04-27 ENCOUNTER — Encounter: Payer: Self-pay | Admitting: Internal Medicine

## 2011-04-27 DIAGNOSIS — I498 Other specified cardiac arrhythmias: Secondary | ICD-10-CM

## 2011-04-27 DIAGNOSIS — R0789 Other chest pain: Secondary | ICD-10-CM | POA: Diagnosis not present

## 2011-04-27 DIAGNOSIS — I10 Essential (primary) hypertension: Secondary | ICD-10-CM | POA: Diagnosis not present

## 2011-04-27 DIAGNOSIS — G9001 Carotid sinus syncope: Secondary | ICD-10-CM

## 2011-04-27 LAB — PACEMAKER DEVICE OBSERVATION
AL AMPLITUDE: 5 mv
DEVICE MODEL PM: 7303108
RV LEAD IMPEDENCE PM: 450 Ohm
RV LEAD THRESHOLD: 0.75 V
VENTRICULAR PACING PM: 1

## 2011-04-27 NOTE — Progress Notes (Signed)
Wound check-PPM 

## 2011-05-14 DIAGNOSIS — R0789 Other chest pain: Secondary | ICD-10-CM | POA: Diagnosis not present

## 2011-05-14 DIAGNOSIS — I10 Essential (primary) hypertension: Secondary | ICD-10-CM | POA: Diagnosis not present

## 2011-05-14 DIAGNOSIS — Z950 Presence of cardiac pacemaker: Secondary | ICD-10-CM | POA: Diagnosis not present

## 2011-05-14 DIAGNOSIS — R002 Palpitations: Secondary | ICD-10-CM | POA: Diagnosis not present

## 2011-05-26 ENCOUNTER — Ambulatory Visit: Payer: Medicare Other | Admitting: Cardiovascular Disease

## 2011-05-29 ENCOUNTER — Encounter: Payer: Self-pay | Admitting: Cardiovascular Disease

## 2011-05-29 ENCOUNTER — Ambulatory Visit (INDEPENDENT_AMBULATORY_CARE_PROVIDER_SITE_OTHER): Payer: Medicare Other | Admitting: Cardiovascular Disease

## 2011-05-29 VITALS — BP 119/56 | HR 64 | Ht 62.5 in | Wt 112.0 lb

## 2011-05-29 DIAGNOSIS — E785 Hyperlipidemia, unspecified: Secondary | ICD-10-CM

## 2011-05-29 DIAGNOSIS — I442 Atrioventricular block, complete: Secondary | ICD-10-CM

## 2011-05-29 NOTE — Progress Notes (Signed)
Joy Newman Date of Birth  1927/12/19 [facility]     [place] [facility]  [address]    [address]   [address] [place], [place]  [address]    [place], [place]  [address] [phone]  Fax  [phone]  [phone]  Fax [phone]  Problem list: 1. Mitral valve prolapse/mitral regurgitation 2. History of syncope-status post pacemaker implantation 3. Hyperlipidemia  History of Present Illness:  Joy Newman was recently hospitalized after having an episode of syncope. She was found to have complete heart block and had a pacemaker placed. She's done quite a bit better. She is no longer having any further episodes of syncope. She presents today for followup evaluation.  She's done very well. She has some tenderness above her pacemaker where her bra crossed the pacer.  Current Outpatient Prescriptions on File Prior to Visit  Medication Sig Dispense Refill  . Ascorbic Acid (VITAMIN C) 1000 MG tablet Take 1,000 mg by mouth daily.        Marland Kitchen aspirin 81 MG tablet Take 81 mg by mouth daily.       . Calcium 1500 MG tablet Take 1,500 mg by mouth 2 (two) times daily.        . cholecalciferol (VITAMIN D) 1000 UNITS tablet Take 2,000 Units by mouth daily.        Marland Kitchen diltiazem (TIAZAC) 120 MG 24 hr capsule Take 120 mg by mouth daily.        Marland Kitchen estrogens, conjugated, (PREMARIN) 0.3 MG tablet Take 0.3 mg by mouth daily.       . Multiple Vitamin (SUPER STRESS 600/BIOTIN PO) Take 3 tablets by mouth daily.        . Multiple Vitamins-Minerals (MULTIVITAMIN PO) Take 1 tablet by mouth daily.       . pantoprazole (PROTONIX) 40 MG tablet Take 40 mg by mouth daily.        Marland Kitchen triamterene-hydrochlorothiazide (MAXZIDE-25) 37.5-25 MG per tablet Take 0.5 tablets by mouth daily.          Allergies  Allergen Reactions  . Imdur (Isosorbide Mononitrate)     Intolerant and cause headaches and nausea  . Polysporin (Bacitracin-Polymyxin B)     Rash  . Quinine Derivatives     Increase in Heart  Rate and Decrease in BP   . Tetracyclines & Related     Rash    Past Medical History  Diagnosis Date  . Hyperlipidemia   . Diastolic dysfunction   . CAD (coronary artery disease) no obstruction at cath     Hx of with an anteroapical M.I  . Pulmonary hypertension      Hx of Mild  . Tricuspid regurgitation     Moderate regurgitation  . Kidney stones   . Arteriovenous malformation of colon   . Diverticulosis   . Carotid sinus hypersensitivity   . Syncope   . Hypertension   . PVC and PACs     Past Surgical History  Procedure Date  . Appendectomy     Hx of  . Tonsillectomy     Hx of  . Abdominal hysterectomy     Hx of  . Cardiac catheterization     Revealed an apical wall motion abnormality  . Cardiovascular stress test 02-21-2006    EF 84%, which revealed an apical defect    History  Smoking status  . Never Smoker   Smokeless tobacco  . Never Used    History  Alcohol Use No  Rarely    Family History  Problem Relation Age of Onset  . Stroke Mother   . Kidney disease Father   . Cancer Brother   . Kidney disease Sister     Reviw of Systems:  Reviewed in the HPI.  All other systems are negative.  Physical Exam: Blood pressure 119/56, pulse 64, height 5' 2.5" (1.588 m), weight 112 lb (50.803 kg). General: Well developed, well nourished, in no acute distress.  Head: Normocephalic, atraumatic, sclera non-icteric, mucus membranes are moist,   Neck: Supple. Carotids are 2 + without bruits. No JVD  Lungs: Clear bilaterally to auscultation.  Heart: regular rate  With normal  S1 S2. No murmurs, gallops or rubs.  The patient's site is well healed. There is a small suture coming from the lateral aspect of the wound. Is no evidence of infection. There is no drainage. This suture was bathed with Betadine and was pulled out from the skin.  Abdomen: Soft, non-tender, non-distended with normal bowel sounds. No hepatomegaly. No rebound/guarding. No masses.  Msk:   Strength and tone are normal  Extremities: No clubbing or cyanosis. No edema.  Distal pedal pulses are 2+ and equal bilaterally.  Neuro: Alert and oriented X 3. Moves all extremities spontaneously.  Psych:  Responds to questions appropriately with a normal affect.  ECG:  Assessment / Plan:

## 2011-05-29 NOTE — Assessment & Plan Note (Signed)
She is now status post pacemaker implantation. She's feeling quite a bit better. There was a small suture coming from the edge of the wound. The suture was bathed with Betadine and was pulled out.  She'll continue to followup in pacer clinic. I'll see her again in 6 months.

## 2011-05-29 NOTE — Patient Instructions (Signed)
Your physician wants you to follow-up in: 6 MONTHS You will receive a reminder letter in the mail two months in advance. If you don't receive a letter, please call our office to schedule the follow-up appointment.  Your physician recommends that you return for a FASTING lipid profile: 6 MONTHS    

## 2011-06-28 DIAGNOSIS — M899 Disorder of bone, unspecified: Secondary | ICD-10-CM | POA: Diagnosis not present

## 2011-07-22 ENCOUNTER — Emergency Department (HOSPITAL_COMMUNITY)
Admission: EM | Admit: 2011-07-22 | Discharge: 2011-07-23 | Disposition: A | Discharge status: Home / Self Care | Payer: Medicare Other | Attending: Emergency Medicine | Admitting: Emergency Medicine

## 2011-07-22 DIAGNOSIS — Z79899 Other long term (current) drug therapy: Secondary | ICD-10-CM | POA: Insufficient documentation

## 2011-07-22 DIAGNOSIS — R1110 Vomiting, unspecified: Secondary | ICD-10-CM | POA: Insufficient documentation

## 2011-07-22 DIAGNOSIS — S0990XA Unspecified injury of head, initial encounter: Secondary | ICD-10-CM

## 2011-07-22 DIAGNOSIS — Z7982 Long term (current) use of aspirin: Secondary | ICD-10-CM | POA: Insufficient documentation

## 2011-07-22 DIAGNOSIS — W108XXA Fall (on) (from) other stairs and steps, initial encounter: Secondary | ICD-10-CM | POA: Insufficient documentation

## 2011-07-22 DIAGNOSIS — S199XXA Unspecified injury of neck, initial encounter: Secondary | ICD-10-CM | POA: Diagnosis not present

## 2011-07-22 DIAGNOSIS — I10 Essential (primary) hypertension: Secondary | ICD-10-CM | POA: Insufficient documentation

## 2011-07-22 DIAGNOSIS — E785 Hyperlipidemia, unspecified: Secondary | ICD-10-CM | POA: Insufficient documentation

## 2011-07-22 DIAGNOSIS — R51 Headache: Secondary | ICD-10-CM | POA: Insufficient documentation

## 2011-07-22 DIAGNOSIS — Y92009 Unspecified place in unspecified non-institutional (private) residence as the place of occurrence of the external cause: Secondary | ICD-10-CM | POA: Insufficient documentation

## 2011-07-22 DIAGNOSIS — I2510 Atherosclerotic heart disease of native coronary artery without angina pectoris: Secondary | ICD-10-CM | POA: Insufficient documentation

## 2011-07-22 DIAGNOSIS — M25559 Pain in unspecified hip: Secondary | ICD-10-CM | POA: Insufficient documentation

## 2011-07-22 MED ORDER — ONDANSETRON HCL 4 MG/2ML IJ SOLN
INTRAMUSCULAR | Status: AC
  Filled 2011-07-22: qty 2

## 2011-07-22 NOTE — ED Notes (Addendum)
As per EMS, pt fell going down stairs hitting rt side and head. NO LOC, pt is not aware that she fell. Pt A&O x4. Pt N/V. Pt had 4mg  Zofran IV en route.

## 2011-07-22 NOTE — ED Notes (Signed)
AOZ:[id number]<VH> Expected date:<BR> Expected time:10:27 PM<BR> Means of arrival:Ambulance<BR> Comments:<BR> M15 -- Fall

## 2011-07-23 ENCOUNTER — Emergency Department (HOSPITAL_COMMUNITY): Payer: Medicare Other

## 2011-07-23 DIAGNOSIS — M25559 Pain in unspecified hip: Secondary | ICD-10-CM | POA: Diagnosis not present

## 2011-07-23 DIAGNOSIS — S0993XA Unspecified injury of face, initial encounter: Secondary | ICD-10-CM | POA: Diagnosis not present

## 2011-07-23 DIAGNOSIS — R51 Headache: Secondary | ICD-10-CM | POA: Diagnosis not present

## 2011-07-23 DIAGNOSIS — E785 Hyperlipidemia, unspecified: Secondary | ICD-10-CM | POA: Diagnosis not present

## 2011-07-23 DIAGNOSIS — Z79899 Other long term (current) drug therapy: Secondary | ICD-10-CM | POA: Diagnosis not present

## 2011-07-23 DIAGNOSIS — S0990XA Unspecified injury of head, initial encounter: Secondary | ICD-10-CM | POA: Diagnosis not present

## 2011-07-23 DIAGNOSIS — I10 Essential (primary) hypertension: Secondary | ICD-10-CM | POA: Diagnosis not present

## 2011-07-23 DIAGNOSIS — I2510 Atherosclerotic heart disease of native coronary artery without angina pectoris: Secondary | ICD-10-CM | POA: Diagnosis not present

## 2011-07-23 DIAGNOSIS — Z7982 Long term (current) use of aspirin: Secondary | ICD-10-CM | POA: Diagnosis not present

## 2011-07-23 DIAGNOSIS — R1110 Vomiting, unspecified: Secondary | ICD-10-CM | POA: Diagnosis not present

## 2011-07-23 NOTE — ED Notes (Signed)
Urine collected and placed at bedside.  

## 2011-07-23 NOTE — ED Provider Notes (Signed)
History     CSN: [id number]  Arrival date & time 07/22/11  2229   First MD Initiated Contact with Patient 07/23/11 0011      Chief Complaint  Patient presents with  . Fall    (Consider location/radiation/quality/duration/timing/severity/associated sxs/prior treatment) HPI This is an 78 year old white female who fell down several steps at home. This occurred just prior to arrival. She does not recall the fall suggesting a brief loss of consciousness. She vomited after the fall several times. She initially had mild to moderate pain in her right hip and the right side of her head and where she is struck the ground but states that the pain is now resolved. She is no longer nauseated. She denies any other injury. She does state she has a "bubble" (hematoma) on the right side of her head.  Past Medical History  Diagnosis Date  . Hyperlipidemia   . Diastolic dysfunction   . CAD (coronary artery disease) no obstruction at cath     Hx of with an anteroapical M.I  . Pulmonary hypertension      Hx of Mild  . Tricuspid regurgitation     Moderate regurgitation  . Kidney stones   . Arteriovenous malformation of colon   . Diverticulosis   . Carotid sinus hypersensitivity   . Syncope   . Hypertension   . PVC and PACs     Past Surgical History  Procedure Date  . Appendectomy     Hx of  . Tonsillectomy     Hx of  . Abdominal hysterectomy     Hx of  . Cardiac catheterization     Revealed an apical wall motion abnormality  . Cardiovascular stress test 02-21-2006    EF 84%, which revealed an apical defect    Family History  Problem Relation Age of Onset  . Stroke Mother   . Kidney disease Father   . Cancer Brother   . Kidney disease Sister     History  Substance Use Topics  . Smoking status: Never Smoker   . Smokeless tobacco: Never Used  . Alcohol Use: No     Rarely    OB History    Grav Para Term Preterm Abortions TAB SAB Ect Mult Living                  Review  of Systems  All other systems reviewed and are negative.    Allergies  Imdur; Polysporin; Quinine derivatives; and Tetracyclines & related  Home Medications   Current Outpatient Rx  Name Route Sig Dispense Refill  . VITAMIN C 1000 MG PO TABS Oral Take 1,000 mg by mouth daily.      . ASPIRIN 81 MG PO TABS Oral Take 81 mg by mouth daily.     Marland Kitchen CALCIUM 1500 MG PO TABS Oral Take 1,500 mg by mouth 2 (two) times daily.      Marland Kitchen VITAMIN D 1000 UNITS PO TABS Oral Take 2,000 Units by mouth daily.      Marland Kitchen DILTIAZEM HCL ER BEADS 120 MG PO CP24 Oral Take 120 mg by mouth daily.      Marland Kitchen ESTROGENS CONJUGATED 0.3 MG PO TABS Oral Take 0.3 mg by mouth daily.     . SUPER STRESS 600/BIOTIN PO Oral Take 3 tablets by mouth daily.      . MULTIVITAMIN PO Oral Take 1 tablet by mouth daily.     Marland Kitchen PANTOPRAZOLE SODIUM 40 MG PO TBEC Oral Take 40 mg  by mouth daily.      . TRIAMTERENE-HCTZ 37.5-25 MG PO TABS Oral Take 0.5 tablets by mouth daily.        BP 158/59  Pulse 80  Temp(Src) 97.9 F (36.6 C) (Oral)  Resp 18  SpO2 98%  Physical Exam General: Well-developed, well-nourished female in no acute distress; appearance consistent with age of record; immobilized in cervical collar and vest HENT: normocephalic, small hematoma right parietal scalp Eyes: pupils equal round and reactive to light; extraocular muscles intact Neck: Unremarkable for low collar neck is supple without tenderness or pain on movement Heart: regular rate and rhythm Lungs: Normal respiratory effort and excursion Abdomen: soft; nondistended; nontender Extremities: No deformity; full range of motion; pulses normal; no edema Neurologic: Awake, alert and oriented; motor function intact in all extremities and symmetric; no facial droop Skin: Warm and dry Psychiatric: Normal mood and affect    ED Course  Procedures (including critical care time)     MDM   Nursing notes and vitals signs, including pulse oximetry, reviewed.  Summary of  this visit's results, reviewed by myself:  Labs:  Results for orders placed in visit on 04/27/11  PACEMAKER DEVICE OBSERVATION      Component Value Range   DEVICE MODEL PM [id number]     [id number] Lewayne Bunting   M.D.     [id number] Lewayne Bunting   M.D.     [facility] NOTES PM       Value: Wound check appointment. Steri-strips removed. Wound without redness or edema. Incision edges approximated, wound well healed. Normal device function. Thresholds, sensing, and impedances consistent with implant measurements. Device programmed at      3.5V/auto capture programmed on for extra safety margin until 3 month visit. Histogram distribution appropriate for patient and level of activity. 2 mode switches, <1%.  No high ventricular rates noted. Patient educated about wound care, arm mobility,      lifting restrictions. ROV in 3 months with implanting physician.   ATRIAL PACING PM 8.9     VENTRICULAR PACING PM 1     BATTERY VOLTAGE 3.11328     AL IMPEDENCE PM 487.5     RV LEAD IMPEDENCE PM 450.0     AL AMPLITUDE 5     RV LEAD AMPLITUDE 12     AL THRESHOLD 0.625     RV LEAD THRESHOLD 0.75     BAMS-0001 150     BAMS-0003 70      Imaging Studies: Ct Head Wo Contrast  07/23/2011  *RADIOLOGY REPORT*  Clinical Data:  Fall down stairs, head injury  CT HEAD WITHOUT CONTRAST CT CERVICAL SPINE WITHOUT CONTRAST  Technique:  Multidetector CT imaging of the head and cervical spine was performed following the standard protocol without intravenous contrast.  Multiplanar CT image reconstructions of the cervical spine were also generated.  Comparison:  [facility] CT abdomen/pelvis dated 04/11/2011  CT HEAD  Findings: No evidence of parenchymal hemorrhage or extra-axial fluid collection. No mass lesion, mass effect, or midline shift.  No CT evidence of acute infarction.  Cerebral volume is age appropriate.  No ventriculomegaly.  The visualized paranasal sinuses are essentially clear. The mastoid air cells are  unopacified.  No evidence of calvarial fracture.  IMPRESSION: No evidence of acute intracranial abnormality.  CT CERVICAL SPINE  Findings: Reversal of the normal cervical lordosis, likely degenerative.  No evidence of fracture or dislocation.  The vertebral body heights and intervertebral disc spaces are maintained.  The dens appears  intact.  Asymmetric appearance of the lateral masses of C1 (series 9/image 14), likely degenerative, without evidence of fracture.  No prevertebral soft tissue swelling.  Moderate multilevel degenerative changes.  Visualized thyroid is unremarkable.  Vascular calcifications.  Visualized lung apices are notable for biapical pleural parenchymal scarring.  IMPRESSION:  No evidence of traumatic injury to the cervical spine.  Moderate multilevel degenerative changes, including at C1-2.  Original Report Authenticated By: Charline Bills, M.D.   Ct Cervical Spine Wo Contrast  07/23/2011  *RADIOLOGY REPORT*  Clinical Data:  Fall down stairs, head injury  CT HEAD WITHOUT CONTRAST CT CERVICAL SPINE WITHOUT CONTRAST  Technique:  Multidetector CT imaging of the head and cervical spine was performed following the standard protocol without intravenous contrast.  Multiplanar CT image reconstructions of the cervical spine were also generated.  Comparison:  [facility] G[facility] CT abdomen/pelvis dated 04/11/2011  CT HEAD  Findings: No evidence of parenchymal hemorrhage or extra-axial fluid collection. No mass lesion, mass effect, or midline shift.  No CT evidence of acute infarction.  Cerebral volume is age appropriate.  No ventriculomegaly.  The visualized paranasal sinuses are essentially clear. The mastoid air cells are unopacified.  No evidence of calvarial fracture.  IMPRESSION: No evidence of acute intracranial abnormality.  CT CERVICAL SPINE  Findings: Reversal of the normal cervical lordosis, likely degenerative.  No evidence of fracture or dislocation.  The vertebral body heights and intervertebral  disc spaces are maintained.  The dens appears intact.  Asymmetric appearance of the lateral masses of C1 (series 9/image 14), likely degenerative, without evidence of fracture.  No prevertebral soft tissue swelling.  Moderate multilevel degenerative changes.  Visualized thyroid is unremarkable.  Vascular calcifications.  Visualized lung apices are notable for biapical pleural parenchymal scarring.  IMPRESSION:  No evidence of traumatic injury to the cervical spine.  Moderate multilevel degenerative changes, including at C1-2.  Original Report Authenticated By: Charline Bills, M.D.            Hanley Seamen, MD 07/23/11 [phone]

## 2011-07-23 NOTE — Discharge Instructions (Signed)
Head Injury, Adult  You have had a head injury that does not appear serious at this time. A concussion is a state of changed mental ability, usually from a blow to the head. You should take clear liquids for the rest of the day and then resume your regular diet. You should not take sedatives or alcoholic beverages for as long as directed by your caregiver after discharge. After injuries such as yours, most problems occur within the first 24 hours.  SYMPTOMS  These minor symptoms may be experienced after discharge:  · Memory difficulties.  · Dizziness.  · Headaches.  · Double vision.  · Hearing difficulties.  · Depression.  · Tiredness.  · Weakness.  · Difficulty with concentration.  If you experience any of these problems, you should not be alarmed. A concussion requires a few days for recovery. Many patients with head injuries frequently experience such symptoms. Usually, these problems disappear without medical care. If symptoms last for more than one day, notify your caregiver. See your caregiver sooner if symptoms are becoming worse rather than better.  HOME CARE INSTRUCTIONS   · During the next 24 hours you must stay with someone who can watch you for the warning signs listed below.  Although it is unlikely that serious side effects will occur, you should be aware of signs and symptoms which may necessitate your return to this location. Side effects may occur up to 7 - 10 days following the injury. It is important for you to carefully monitor your condition and contact your caregiver or seek immediate medical attention if there is a change in your condition.  SEEK IMMEDIATE MEDICAL CARE IF:   · There is confusion or drowsiness.  · You can not awaken the injured person.  · There is nausea (feeling sick to your stomach) or continued, forceful vomiting.  · You notice dizziness or unsteadiness which is getting worse, or inability to walk.  · You have convulsions or unconsciousness.  · You experience severe,  persistent headaches not relieved by over-the-counter or prescription medicines for pain. (Do not take aspirin as this impairs clotting abilities). Take other pain medications only as directed.  · You can not use arms or legs normally.  · There is clear or bloody discharge from the nose or ears.  MAKE SURE YOU:   · Understand these instructions.  · Will watch your condition.  · Will get help right away if you are not doing well or get worse.  Document Released: 03/20/2005 Document Revised: 03/09/2011 Document Reviewed: 02/05/2009  [employer]® Patient Information ©2012 [employer].

## 2011-07-26 ENCOUNTER — Encounter: Payer: Self-pay | Admitting: Internal Medicine

## 2011-07-26 ENCOUNTER — Ambulatory Visit (INDEPENDENT_AMBULATORY_CARE_PROVIDER_SITE_OTHER): Payer: Medicare Other | Admitting: Internal Medicine

## 2011-07-26 VITALS — BP 136/60 | HR 68 | Ht 62.0 in | Wt 114.0 lb

## 2011-07-26 DIAGNOSIS — I442 Atrioventricular block, complete: Secondary | ICD-10-CM | POA: Diagnosis not present

## 2011-07-26 DIAGNOSIS — R55 Syncope and collapse: Secondary | ICD-10-CM

## 2011-07-26 DIAGNOSIS — I10 Essential (primary) hypertension: Secondary | ICD-10-CM | POA: Diagnosis not present

## 2011-07-26 DIAGNOSIS — Z950 Presence of cardiac pacemaker: Secondary | ICD-10-CM | POA: Diagnosis not present

## 2011-07-26 LAB — PACEMAKER DEVICE OBSERVATION
ATRIAL PACING PM: 28
BAMS-0001: 150 {beats}/min
BAMS-0003: 70 {beats}/min
DEVICE MODEL PM: 7303108
RV LEAD IMPEDENCE PM: 490 Ohm
RV LEAD THRESHOLD: 1 V
VENTRICULAR PACING PM: 2

## 2011-07-26 NOTE — Assessment & Plan Note (Addendum)
No recurrent syncope. Her fall certainly sounds like it was just that

## 2011-07-26 NOTE — Patient Instructions (Addendum)
Your physician wants you to follow-up in: January in 2014 with Dr Graciela Husbands.  You will receive a reminder letter in the mail two months in advance. If you don't receive a letter, please call our office to schedule the follow-up appointment.  Your physician recommends that you continue on your current medications as directed. Please refer to the Current Medication list given to you today.

## 2011-07-26 NOTE — Assessment & Plan Note (Signed)
The patient's device was interrogated and the information was fully reviewed.  The device was reprogrammed to maximize longevity. Home monitoring was set up

## 2011-07-26 NOTE — Assessment & Plan Note (Signed)
Well controlled 

## 2011-07-26 NOTE — Assessment & Plan Note (Signed)
Intermittent. Stable post pacing 

## 2011-07-26 NOTE — Progress Notes (Signed)
HPI  Joy Newman is a 78 y.o. female Seen following pacemaker implantation January 2013 4 syncope and intermittent complete heart block. There was a decedent PR prolongation suggestive of a neurally mediated trigger. She also had carotid sinus hypersensitivity   She has a history of ischemic heart disease with prior MI.  She has had no recurrent syncope. She did have a fall but this was observed to occur when she stepped onto a half step. Otherwise she is well without chest pain or edema or shortness of breath  Past Medical History  Diagnosis Date  . Hyperlipidemia   . Diastolic dysfunction   . CAD (coronary artery disease) no obstruction at cath     Hx of with an anteroapical M.I  . Pulmonary hypertension      Hx of Mild  . Tricuspid regurgitation     Moderate regurgitation  . Kidney stones   . Arteriovenous malformation of colon   . Diverticulosis   . Carotid sinus hypersensitivity   . Syncope   . Hypertension   . PVC and PACs     Past Surgical History  Procedure Date  . Appendectomy     Hx of  . Tonsillectomy     Hx of  . Abdominal hysterectomy     Hx of  . Cardiac catheterization     Revealed an apical wall motion abnormality  . Cardiovascular stress test 02-21-2006    EF 84%, which revealed an apical defect    Current Outpatient Prescriptions  Medication Sig Dispense Refill  . Ascorbic Acid (VITAMIN C) 1000 MG tablet Take 1,000 mg by mouth daily.        Marland Kitchen aspirin 81 MG tablet Take 81 mg by mouth daily.       . Calcium 1500 MG tablet Take 1,500 mg by mouth 2 (two) times daily.        . cholecalciferol (VITAMIN D) 1000 UNITS tablet Take 2,000 Units by mouth daily.        Marland Kitchen diltiazem (TIAZAC) 120 MG 24 hr capsule Take 120 mg by mouth daily.        Marland Kitchen estrogens, conjugated, (PREMARIN) 0.3 MG tablet Take 0.3 mg by mouth daily.       . Multiple Vitamin (SUPER STRESS 600/BIOTIN PO) Take 3 tablets by mouth daily.        . Multiple Vitamins-Minerals  (MULTIVITAMIN PO) Take 1 tablet by mouth daily.       . pantoprazole (PROTONIX) 40 MG tablet Take 40 mg by mouth daily.        Marland Kitchen triamterene-hydrochlorothiazide (MAXZIDE-25) 37.5-25 MG per tablet Take 0.5 tablets by mouth daily.          Allergies  Allergen Reactions  . Imdur (Isosorbide Mononitrate)     Intolerant and cause headaches and nausea  . Polysporin (Bacitracin-Polymyxin B)     Rash  . Quinine Derivatives     Increase in Heart Rate and Decrease in BP   . Tetracyclines & Related     Rash    Review of Systems negative except from HPI and PMH  Physical Exam BP 136/60  Pulse 68  Ht 5\' 2"  (1.575 m)  Wt 114 lb (51.71 kg)  BMI 20.85 kg/m2 Well developed and well nourished in no acute distress HENT normal E scleral and icterus clear Neck Supple JVP flat; carotids brisk and full Clear to ausculation Pacemaker pocket well-healed Regular rate and rhythm, no murmurs gallops or rub Soft with active bowel sounds No clubbing  cyanosis none Edema Alert and oriented, grossly normal motor and sensory function Skin Warm and Dry   Assessment and  Plan

## 2011-08-10 ENCOUNTER — Telehealth: Payer: Self-pay | Admitting: Cardiovascular Disease

## 2011-08-10 NOTE — Telephone Encounter (Signed)
New msg Pt wants to ask some questions about mammogram. Please call

## 2011-08-10 NOTE — Telephone Encounter (Signed)
Pt can have mammogram with pacer placed 04/2011

## 2011-08-31 ENCOUNTER — Other Ambulatory Visit: Payer: Self-pay | Admitting: Gynecology

## 2011-08-31 DIAGNOSIS — Z1231 Encounter for screening mammogram for malignant neoplasm of breast: Secondary | ICD-10-CM

## 2011-09-18 ENCOUNTER — Ambulatory Visit
Admission: RE | Admit: 2011-09-18 | Discharge: 2011-09-18 | Disposition: A | Discharge status: Home / Self Care | Payer: Medicare Other | Source: Ambulatory Visit | Attending: Gynecology | Admitting: Gynecology

## 2011-09-18 DIAGNOSIS — Z1231 Encounter for screening mammogram for malignant neoplasm of breast: Secondary | ICD-10-CM | POA: Diagnosis not present

## 2011-09-26 ENCOUNTER — Other Ambulatory Visit: Payer: Self-pay | Admitting: Gynecology

## 2011-09-26 DIAGNOSIS — R928 Other abnormal and inconclusive findings on diagnostic imaging of breast: Secondary | ICD-10-CM

## 2011-09-27 ENCOUNTER — Encounter: Payer: Self-pay | Admitting: Cardiovascular Disease

## 2011-10-03 ENCOUNTER — Ambulatory Visit
Admission: RE | Admit: 2011-10-03 | Discharge: 2011-10-03 | Disposition: A | Discharge status: Home / Self Care | Payer: Medicare Other | Source: Ambulatory Visit | Attending: Gynecology | Admitting: Gynecology

## 2011-10-03 DIAGNOSIS — R928 Other abnormal and inconclusive findings on diagnostic imaging of breast: Secondary | ICD-10-CM

## 2011-10-03 DIAGNOSIS — N63 Unspecified lump in unspecified breast: Secondary | ICD-10-CM | POA: Diagnosis not present

## 2011-10-09 DIAGNOSIS — L821 Other seborrheic keratosis: Secondary | ICD-10-CM | POA: Diagnosis not present

## 2011-10-09 DIAGNOSIS — Z85828 Personal history of other malignant neoplasm of skin: Secondary | ICD-10-CM | POA: Diagnosis not present

## 2011-10-09 DIAGNOSIS — D485 Neoplasm of uncertain behavior of skin: Secondary | ICD-10-CM | POA: Diagnosis not present

## 2011-10-09 DIAGNOSIS — D239 Other benign neoplasm of skin, unspecified: Secondary | ICD-10-CM | POA: Diagnosis not present

## 2011-10-09 DIAGNOSIS — Z85820 Personal history of malignant melanoma of skin: Secondary | ICD-10-CM | POA: Diagnosis not present

## 2011-10-23 DIAGNOSIS — N951 Menopausal and female climacteric states: Secondary | ICD-10-CM | POA: Diagnosis not present

## 2011-10-23 DIAGNOSIS — Z1212 Encounter for screening for malignant neoplasm of rectum: Secondary | ICD-10-CM | POA: Diagnosis not present

## 2011-10-23 DIAGNOSIS — M810 Age-related osteoporosis without current pathological fracture: Secondary | ICD-10-CM | POA: Diagnosis not present

## 2011-11-20 ENCOUNTER — Other Ambulatory Visit (INDEPENDENT_AMBULATORY_CARE_PROVIDER_SITE_OTHER): Payer: Medicare Other

## 2011-11-20 DIAGNOSIS — E785 Hyperlipidemia, unspecified: Secondary | ICD-10-CM | POA: Diagnosis not present

## 2011-11-20 LAB — HEPATIC FUNCTION PANEL
ALT: 17 U/L (ref 0–35)
Bilirubin, Direct: 0.1 mg/dL (ref 0.0–0.3)
Total Bilirubin: 0.4 mg/dL (ref 0.3–1.2)

## 2011-11-20 LAB — BASIC METABOLIC PANEL
BUN: 13 mg/dL (ref 6–23)
CO2: 33 mEq/L — ABNORMAL HIGH (ref 19–32)
Chloride: 104 mEq/L (ref 96–112)
Glucose, Bld: 86 mg/dL (ref 70–99)
Potassium: 4.4 mEq/L (ref 3.5–5.1)

## 2011-11-20 LAB — LIPID PANEL
HDL: 108.4 mg/dL (ref >39.00)
Total CHOL/HDL Ratio: 2
VLDL: 20.6 mg/dL (ref 0.0–40.0)

## 2011-11-20 LAB — LDL CHOLESTEROL, DIRECT: Direct LDL: 87.5 mg/dL

## 2011-11-27 ENCOUNTER — Ambulatory Visit (INDEPENDENT_AMBULATORY_CARE_PROVIDER_SITE_OTHER): Payer: Medicare Other | Admitting: Cardiovascular Disease

## 2011-11-27 ENCOUNTER — Encounter: Payer: Self-pay | Admitting: Cardiovascular Disease

## 2011-11-27 VITALS — BP 117/69 | HR 65 | Ht 62.5 in | Wt 112.1 lb

## 2011-11-27 DIAGNOSIS — Z950 Presence of cardiac pacemaker: Secondary | ICD-10-CM

## 2011-11-27 DIAGNOSIS — I059 Rheumatic mitral valve disease, unspecified: Secondary | ICD-10-CM

## 2011-11-27 DIAGNOSIS — I10 Essential (primary) hypertension: Secondary | ICD-10-CM | POA: Diagnosis not present

## 2011-11-27 DIAGNOSIS — I341 Nonrheumatic mitral (valve) prolapse: Secondary | ICD-10-CM

## 2011-11-27 NOTE — Assessment & Plan Note (Signed)
Stable

## 2011-11-27 NOTE — Patient Instructions (Addendum)
Your physician wants you to follow-up in: 1 year or sooner if needed, You will receive a reminder letter in the mail two months in advance. If you don't receive a letter, please call our office to schedule the follow-up appointment. 

## 2011-11-27 NOTE — Assessment & Plan Note (Signed)
Pacemaker seems to be stable. She does have some occasional chest twinges which I suspect is due to scarring.

## 2011-11-27 NOTE — Progress Notes (Signed)
Joy Newman Date of Birth  1927-12-12 [facility]     [place] [facility]  [address]    [address]   [address] [place], [place]  [address]    [place], [place]  [address] [phone]  Fax  [phone]  [phone]  Fax [phone]  Problem list: 1. Mitral valve prolapse/mitral regurgitation 2. History of syncope-status post pacemaker implantation 3. Hyperlipidemia  History of Present Illness:  Joy Newman is an 78 yo with hx of syncope - found to have complete heart block - now s/p pacer.   She is back doing her yoga.  She has had some "pulling sensation" or "sticking sensation" at her pacer site.  No real problems.    Current Outpatient Prescriptions on File Prior to Visit  Medication Sig Dispense Refill  . Ascorbic Acid (VITAMIN C) 1000 MG tablet Take 1,000 mg by mouth daily.        Marland Kitchen aspirin 81 MG tablet Take 81 mg by mouth daily.       . Calcium 1500 MG tablet Take 1,500 mg by mouth 2 (two) times daily.        . cholecalciferol (VITAMIN D) 1000 UNITS tablet Take 2,000 Units by mouth daily.        Marland Kitchen diltiazem (TIAZAC) 120 MG 24 hr capsule Take 120 mg by mouth daily.        Marland Kitchen estrogens, conjugated, (PREMARIN) 0.3 MG tablet Take 0.3 mg by mouth every other day.       . Multiple Vitamin (SUPER STRESS 600/BIOTIN PO) Take 2 tablets by mouth daily      . Multiple Vitamins-Minerals (MULTIVITAMIN PO) Take 1 tablet by mouth daily.       . pantoprazole (PROTONIX) 40 MG tablet Take 40 mg by mouth daily.        Marland Kitchen triamterene-hydrochlorothiazide (MAXZIDE-25) 37.5-25 MG per tablet Take 0.5 tablets by mouth daily.          Allergies  Allergen Reactions  . Imdur (Isosorbide Mononitrate)     Intolerant and cause headaches and nausea  . Polysporin (Bacitracin-Polymyxin B)     Rash  . Quinine Derivatives     Increase in Heart Rate and Decrease in BP   . Tetracyclines & Related     Rash    Past Medical History  Diagnosis Date  . Hyperlipidemia   . Diastolic  dysfunction   . CAD (coronary artery disease) no obstruction at cath     Hx of with an anteroapical M.I  . Pulmonary hypertension      Hx of Mild  . Tricuspid regurgitation     Moderate regurgitation  . Kidney stones   . Arteriovenous malformation of colon   . Diverticulosis   . Carotid sinus hypersensitivity   . Syncope   . Hypertension   . PVC and PACs   . Complete heart block-intermittent   . Pacemaker     [employer]    Past Surgical History  Procedure Date  . Appendectomy     Hx of  . Tonsillectomy     Hx of  . Abdominal hysterectomy     Hx of  . Cardiac catheterization     Revealed an apical wall motion abnormality  . Cardiovascular stress test 02-21-2006    EF 84%, which revealed an apical defect    History  Smoking status  . Never Smoker   Smokeless tobacco  . Never Used    History  Alcohol Use No  Rarely    Family History  Problem Relation Age of Onset  . Stroke Mother   . Kidney disease Father   . Cancer Brother   . Kidney disease Sister     Reviw of Systems:  Reviewed in the HPI.  All other systems are negative.  Physical Exam: Blood pressure 117/69, pulse 65, height 5' 2.5" (1.588 m), weight 112 lb 1.9 oz (50.857 kg), SpO2 99.00%. General: Well developed, well nourished, in no acute distress.  Head: Normocephalic, atraumatic, sclera non-icteric, mucus membranes are moist,   Neck: Supple. Carotids are 2 + without bruits. No JVD  Lungs: Clear bilaterally to auscultation.  Heart: regular rate  With normal  S1 S2.  She has a soft systolic murmur and a mid systolic click c/w MVP.  The patient's site is well healed. \She is thin and the pacing wires can be seen under skin.  The pacemaker scar is well healed.  Abdomen: Soft, non-tender, non-distended with normal bowel sounds. No hepatomegaly. No rebound/guarding. No masses.  Msk:  Strength and tone are normal  Extremities: No clubbing or cyanosis. No edema.  Distal pedal pulses are 2+ and  equal bilaterally.  Neuro: Alert and oriented X 3. Moves all extremities spontaneously.  Psych:  Responds to questions appropriately with a normal affect.  ECG:  Assessment / Plan:

## 2011-11-27 NOTE — Assessment & Plan Note (Signed)
Her mitral valve prolapse stable.

## 2011-12-26 DIAGNOSIS — Z23 Encounter for immunization: Secondary | ICD-10-CM | POA: Diagnosis not present

## 2012-01-03 DIAGNOSIS — I10 Essential (primary) hypertension: Secondary | ICD-10-CM | POA: Diagnosis not present

## 2012-01-03 DIAGNOSIS — R82998 Other abnormal findings in urine: Secondary | ICD-10-CM | POA: Diagnosis not present

## 2012-01-03 DIAGNOSIS — M810 Age-related osteoporosis without current pathological fracture: Secondary | ICD-10-CM | POA: Diagnosis not present

## 2012-01-03 DIAGNOSIS — E785 Hyperlipidemia, unspecified: Secondary | ICD-10-CM | POA: Diagnosis not present

## 2012-01-04 DIAGNOSIS — H40009 Preglaucoma, unspecified, unspecified eye: Secondary | ICD-10-CM | POA: Diagnosis not present

## 2012-01-10 DIAGNOSIS — I10 Essential (primary) hypertension: Secondary | ICD-10-CM | POA: Diagnosis not present

## 2012-01-10 DIAGNOSIS — Z1331 Encounter for screening for depression: Secondary | ICD-10-CM | POA: Diagnosis not present

## 2012-01-10 DIAGNOSIS — E785 Hyperlipidemia, unspecified: Secondary | ICD-10-CM | POA: Diagnosis not present

## 2012-01-10 DIAGNOSIS — Z0000 Encounter for general adult medical examination without abnormal findings: Secondary | ICD-10-CM | POA: Diagnosis not present

## 2012-01-11 DIAGNOSIS — Z1212 Encounter for screening for malignant neoplasm of rectum: Secondary | ICD-10-CM | POA: Diagnosis not present

## 2012-02-02 DIAGNOSIS — D126 Benign neoplasm of colon, unspecified: Secondary | ICD-10-CM

## 2012-02-02 HISTORY — DX: Benign neoplasm of colon, unspecified: D12.6

## 2012-02-12 ENCOUNTER — Ambulatory Visit (INDEPENDENT_AMBULATORY_CARE_PROVIDER_SITE_OTHER): Payer: Medicare Other | Admitting: Gastroenterology

## 2012-02-12 ENCOUNTER — Encounter: Payer: Self-pay | Admitting: Gastroenterology

## 2012-02-12 VITALS — BP 136/60 | HR 76 | Ht 61.5 in | Wt 112.1 lb

## 2012-02-12 DIAGNOSIS — Q2733 Arteriovenous malformation of digestive system vessel: Secondary | ICD-10-CM

## 2012-02-12 DIAGNOSIS — R195 Other fecal abnormalities: Secondary | ICD-10-CM | POA: Diagnosis not present

## 2012-02-12 DIAGNOSIS — K5520 Angiodysplasia of colon without hemorrhage: Secondary | ICD-10-CM

## 2012-02-12 MED ORDER — PEG-KCL-NACL-NASULF-NA ASC-C 100 G PO SOLR: 1.0000 | Freq: Once | ORAL | Status: DC

## 2012-02-12 NOTE — Progress Notes (Signed)
History of Present Illness: This is an 78 year old female with a known history of colonic AVMs was found to have Hemasure positive stool on screening studies performed in Dr.Tisovec's office. She relates occasional episodes of loose stools and these symptoms occur intermittently and have done so for several years. Her recent blood work was unremarkable including a normal hemoglobin 14. She underwent colonoscopies in 2002, 2005 and 2007. Her last 2 colonoscopies showed AVMs in the cecum and ascending colon. During her last colonoscopy AVMs were ablated with APC. In addition she underwent small bowel enteroscopy in 2007 which showed mild gastritis. Denies weight loss, abdominal pain, constipation, change in stool caliber, melena, hematochezia, nausea, vomiting, dysphagia, reflux symptoms, chest pain.  Review of Systems: Pertinent positive and negative review of systems were noted in the above HPI section. All other review of systems were otherwise negative.  Allergies  Allergen Reactions  . Imdur (Isosorbide Mononitrate)     Intolerant and cause headaches and nausea  . Polysporin (Bacitracin-Polymyxin B)     Rash  . Quinine Derivatives     Increase in Heart Rate and Decrease in BP   . Tetracyclines & Related     Rash   Outpatient Prescriptions Prior to Visit  Medication Sig Dispense Refill  . Ascorbic Acid (VITAMIN C) 1000 MG tablet Take 1,000 mg by mouth daily.        Marland Kitchen aspirin 81 MG tablet Take 81 mg by mouth daily.       . Calcium 1500 MG tablet Take 1,500 mg by mouth 2 (two) times daily.        . cholecalciferol (VITAMIN D) 1000 UNITS tablet Take 2,000 Units by mouth daily.        Marland Kitchen estrogens, conjugated, (PREMARIN) 0.3 MG tablet Take 0.3 mg by mouth every other day.       . Multiple Vitamins-Minerals (MULTIVITAMIN PO) Take 1 tablet by mouth daily.       . pantoprazole (PROTONIX) 40 MG tablet Take 40 mg by mouth daily.        Marland Kitchen triamterene-hydrochlorothiazide (MAXZIDE-25) 37.5-25 MG per  tablet Take 0.5 tablets by mouth daily.        . [DISCONTINUED] diltiazem (TIAZAC) 120 MG 24 hr capsule Take 120 mg by mouth daily.        . [DISCONTINUED] Multiple Vitamin (SUPER STRESS 600/BIOTIN PO) Take 2 tablets by mouth daily       Last reviewed on 02/12/2012 11:02 AM by Meryl Dare, MD,FACG Past Medical History  Diagnosis Date  . Hyperlipidemia   . Diastolic dysfunction   . CAD (coronary artery disease) no obstruction at cath     Hx of with an anteroapical M.I  . Pulmonary hypertension      Hx of Mild  . Tricuspid regurgitation     Moderate regurgitation  . Kidney stones   . Arteriovenous malformation of colon   . Diverticulosis   . Carotid sinus hypersensitivity   . Syncope   . Hypertension   . PVC and PACs   . Complete heart block-intermittent   . Pacemaker     St Jude  . MVP (mitral valve prolapse)   . Basal cell carcinoma   . Melanoma   . Squamous carcinoma    Past Surgical History  Procedure Date  . Appendectomy 1954    Hx of  . Tonsillectomy 1971    Hx of  . Abdominal hysterectomy 1980    Hx of  . Cardiac catheterization  Revealed an apical wall motion abnormality  . Cardiovascular stress test 02-21-2006    EF 84%, which revealed an apical defect  . Nasal reconstruction 1981  . Skin cancer excision     several, basal cell, squamous, melanoma  . Pacemaker insertion    History   Social History  . Marital Status: Widowed    Spouse Name: N/A    Number of Children: 0  . Years of Education: N/A   Occupational History  . Retired OR Engineer, civil (consulting)    Social History Main Topics  . Smoking status: Never Smoker   . Smokeless tobacco: Never Used  . Alcohol Use: Yes     Comment: Rarely  . Drug Use: No  . Sexually Active: None   Other Topics Concern  . None   Social History Narrative   Retired Geologist, engineering.  Clinical research associate.  Nondrinker.  Lives alone.  Has adopted son.   Family History  Problem Relation Age of Onset  . Stroke Mother   . Kidney  disease Father   . Lung cancer Brother   . Kidney disease Sister   . Stroke Brother   . Heart disease Brother     x 2  . Liver cancer Sister     Physical Exam: General: Well developed , well nourished, no acute distress Head: Normocephalic and atraumatic Eyes:  sclerae anicteric, EOMI Ears: Normal auditory acuity Mouth: No deformity or lesions Neck: Supple, no masses or thyromegaly Lungs: Clear throughout to auscultation Heart: Regular rate and rhythm; no murmurs, rubs or bruits, pacemaker Abdomen: Soft, non tender and non distended. No masses, hepatosplenomegaly or hernias noted. Normal Bowel sounds Rectal: deferred until colonoscopy Musculoskeletal: Symmetrical with no gross deformities  Skin: No lesions on visible extremities Pulses:  Normal pulses noted Extremities: No clubbing, cyanosis, edema or deformities noted Neurological: Alert oriented x 4, grossly nonfocal Cervical Nodes:  No significant cervical adenopathy Inguinal Nodes: No significant inguinal adenopathy Psychological:  Alert and cooperative. Normal mood and affect  Assessment and Recommendations:  1. Heme + stool likely secondary to known colonic AVMs. Ablation therapy is often not complete and frequently additional AVMs are present in the gastrointestinal tract. Rule out other colorectal lesions leading to occult blood in her stool. Schedule colonoscopy. The risks, benefits, and alternatives to colonoscopy with possible biopsy and possible polypectomy were discussed with the patient and they consent to proceed. I would recommend deferring future screening occult fecal blood testing and she will likely be intermittently positive with her history of AVMs.

## 2012-02-12 NOTE — Patient Instructions (Addendum)
You have been scheduled for a colonoscopy with propofol. Please follow written instructions given to you at your visit today.  Please pick up your prep kit at the pharmacy within the next 1-3 days. If you use inhalers (even only as needed) or a CPAP machine, please bring them with you on the day of your procedure.  We recommend to discontinue routine Hemoccult testing.   cc: Guerry Bruin, MD

## 2012-02-16 ENCOUNTER — Ambulatory Visit (AMBULATORY_SURGERY_CENTER): Payer: Medicare Other | Admitting: Gastroenterology

## 2012-02-16 ENCOUNTER — Encounter: Payer: Self-pay | Admitting: Gastroenterology

## 2012-02-16 VITALS — BP 144/57 | HR 63 | Temp 97.0°F | Resp 17 | Ht 61.0 in | Wt 112.0 lb

## 2012-02-16 DIAGNOSIS — I10 Essential (primary) hypertension: Secondary | ICD-10-CM | POA: Diagnosis not present

## 2012-02-16 DIAGNOSIS — D126 Benign neoplasm of colon, unspecified: Secondary | ICD-10-CM | POA: Diagnosis not present

## 2012-02-16 DIAGNOSIS — R195 Other fecal abnormalities: Secondary | ICD-10-CM | POA: Diagnosis not present

## 2012-02-16 DIAGNOSIS — D129 Benign neoplasm of anus and anal canal: Secondary | ICD-10-CM | POA: Diagnosis not present

## 2012-02-16 DIAGNOSIS — I2510 Atherosclerotic heart disease of native coronary artery without angina pectoris: Secondary | ICD-10-CM | POA: Diagnosis not present

## 2012-02-16 DIAGNOSIS — D128 Benign neoplasm of rectum: Secondary | ICD-10-CM | POA: Diagnosis not present

## 2012-02-16 DIAGNOSIS — K5520 Angiodysplasia of colon without hemorrhage: Secondary | ICD-10-CM

## 2012-02-16 MED ORDER — SODIUM CHLORIDE 0.9 % IV SOLN: 500.0000 mL | INTRAVENOUS | Status: DC

## 2012-02-16 NOTE — Patient Instructions (Addendum)
Impressions/recommendations:  Polyps-handout given Diverticulosis-handout given Hemorrhoids-handout given  High fiber diet-handout given  YOU HAD AN ENDOSCOPIC PROCEDURE TODAY AT THE [facility]: Refer to the procedure report that was given to you for any specific questions about what was found during the examination.  If the procedure report does not answer your questions, please call your gastroenterologist to clarify.  If you requested that your care partner not be given the details of your procedure findings, then the procedure report has been included in a sealed envelope for you to review at your convenience later.  YOU SHOULD EXPECT: Some feelings of bloating in the abdomen. Passage of more gas than usual.  Walking can help get rid of the air that was put into your GI tract during the procedure and reduce the bloating. If you had a lower endoscopy (such as a colonoscopy or flexible sigmoidoscopy) you may notice spotting of blood in your stool or on the toilet paper. If you underwent a bowel prep for your procedure, then you may not have a normal bowel movement for a few days.  DIET: Your first meal following the procedure should be a light meal and then it is ok to progress to your normal diet.  A half-sandwich or bowl of soup is an example of a good first meal.  Heavy or fried foods are harder to digest and may make you feel nauseous or bloated.  Likewise meals heavy in dairy and vegetables can cause extra gas to form and this can also increase the bloating.  Drink plenty of fluids but you should avoid alcoholic beverages for 24 hours.  ACTIVITY: Your care partner should take you home directly after the procedure.  You should plan to take it easy, moving slowly for the rest of the day.  You can resume normal activity the day after the procedure however you should NOT DRIVE or use heavy machinery for 24 hours (because of the sedation medicines used during the test).    SYMPTOMS TO  REPORT IMMEDIATELY: A gastroenterologist can be reached at any hour.  During normal business hours, 8:30 AM to 5:00 PM Monday through Friday, call [phone].  After hours and on weekends, please call the GI answering service at [phone] who will take a message and have the physician on call contact you.   Following lower endoscopy (colonoscopy or flexible sigmoidoscopy):  Excessive amounts of blood in the stool  Significant tenderness or worsening of abdominal pains  Swelling of the abdomen that is new, acute  Fever of 100F or higher   FOLLOW UP: If any biopsies were taken you will be contacted by phone or by letter within the next 1-3 weeks.  Call your gastroenterologist if you have not heard about the biopsies in 3 weeks.  Our staff will call the home number listed on your records the next business day following your procedure to check on you and address any questions or concerns that you may have at that time regarding the information given to you following your procedure. This is a courtesy call and so if there is no answer at the home number and we have not heard from you through the emergency physician on call, we will assume that you have returned to your regular daily activities without incident.  SIGNATURES/CONFIDENTIALITY: You and/or your care partner have signed paperwork which will be entered into your electronic medical record.  These signatures attest to the fact that that the information above on your After Visit  Summary has been reviewed and is understood.  Full responsibility of the confidentiality of this discharge information lies with you and/or your care-partner.  

## 2012-02-16 NOTE — Progress Notes (Signed)
Patient did not experience any of the following events: a burn prior to discharge; a fall within the facility; wrong site/side/patient/procedure/implant event; or a hospital transfer or hospital admission upon discharge from the facility. (G8907) Patient did not have preoperative order for IV antibiotic SSI prophylaxis. (G8918)  

## 2012-02-16 NOTE — Op Note (Signed)
[facility] [address] [place], [address]   COLONOSCOPY PROCEDURE REPORT  PATIENT: Joy Newman, Joy Newman  MR#: [id number] BIRTHDATE: [birth year] , 78  yrs. old GENDER: Female ENDOSCOPIST: Meryl Dare, MD, [facility] PROCEDURE DATE:  02/16/2012 PROCEDURE:   Colonoscopy with snare polypectomy ASA CLASS:   Class II INDICATIONS:heme-positive stool. MEDICATIONS: MAC sedation, administered by CRNA and propofol (Diprivan) 150mg  IV DESCRIPTION OF PROCEDURE:   After the risks benefits and alternatives of the procedure were thoroughly explained, informed consent was obtained.  A digital rectal exam revealed no abnormalities of the rectum.   The [facility] [id number]-[id number]  endoscope was introduced through the anus and advanced to the cecum, which was identified by both the appendix and ileocecal valve. No adverse events experienced.   The quality of the prep was good, using MoviPrep  The instrument was then slowly withdrawn as the colon was fully examined.  COLON FINDINGS: A sessile polyp measuring 11 mm in size was found in the ascending colon.  A piecemeal polypectomy was performed using snare cautery.  The resection was complete and the polyp tissue was completely retrieved.   A sessile polyp measuring 6 mm in size was found at the hepatic flexure.  A polypectomy was performed with a cold snare.  The resection was complete and the polyp tissue was completely retrieved.   A sessile polyp measuring 5 mm in size was found in the rectum.  A polypectomy was performed with a cold snare.  The resection was complete and the polyp tissue was completely retrieved.   Mild diverticulosis was noted in the sigmoid colon.   The colon was otherwise normal.  There was no diverticulosis, inflammation, polyps or cancers unless previously stated.  Retroflexed views revealed small internal hemorrhoids. The time to cecum=3 minutes 12 seconds.  Withdrawal time=15 minutes 09 seconds.  The scope  was withdrawn and the procedure completed.  COMPLICATIONS: There were no complications.  ENDOSCOPIC IMPRESSION: 1.   Sessile polyp measuring 11 mm in the ascending colon; piecemeal polypectomy performed using snare cautery 2.   Sessile polyp measuring 6 mm at the hepatic flexure; polypectomy performed with a cold snare 3.   Sessile polyp measuring 5 mm in the rectum; polypectomy performed with a cold snare 4.   Mild diverticulosis was noted in the sigmoid colon 5.   Small internal hemorrhoids  RECOMMENDATIONS: 1.  Hold aspirin, aspirin products, and anti-inflammatory medication for 2 weeks. 2.  Await pathology results 3.  Colonoscopy in 1 year if ascending colon polyp is adenomatous, otherwise no plan for routine surveillance or screening colonoscopies. These type of exams usually stop around age 78.  eSigned:  Meryl Dare, MD, [facility] 02/16/2012 11:25 AM   cc: Guerry Bruin, MD   PATIENT NAME:  Joy Newman, Joy Newman MR#: [id number]

## 2012-02-19 ENCOUNTER — Telehealth: Payer: Self-pay | Admitting: *Deleted

## 2012-02-19 NOTE — Telephone Encounter (Signed)
  Follow up Call-  Call back number 02/16/2012  Post procedure Call Back phone  # [phone]  Permission to leave phone message Yes     Patient questions:  Do you have a fever, pain , or abdominal swelling? no Pain Score  0 *  Have you tolerated food without any problems? yes  Have you been able to return to your normal activities? yes  Do you have any questions about your discharge instructions: Diet   no Medications  no Follow up visit  no  Do you have questions or concerns about your Care? no  Actions: * If pain score is 4 or above: No action needed, pain <4.

## 2012-02-20 ENCOUNTER — Encounter: Payer: Self-pay | Admitting: Gastroenterology

## 2012-03-18 DIAGNOSIS — Z950 Presence of cardiac pacemaker: Secondary | ICD-10-CM | POA: Diagnosis not present

## 2012-03-18 DIAGNOSIS — Z85820 Personal history of malignant melanoma of skin: Secondary | ICD-10-CM | POA: Diagnosis not present

## 2012-03-18 DIAGNOSIS — C439 Malignant melanoma of skin, unspecified: Secondary | ICD-10-CM | POA: Diagnosis not present

## 2012-04-11 DIAGNOSIS — Z85828 Personal history of other malignant neoplasm of skin: Secondary | ICD-10-CM | POA: Diagnosis not present

## 2012-04-11 DIAGNOSIS — D485 Neoplasm of uncertain behavior of skin: Secondary | ICD-10-CM | POA: Diagnosis not present

## 2012-04-11 DIAGNOSIS — D239 Other benign neoplasm of skin, unspecified: Secondary | ICD-10-CM | POA: Diagnosis not present

## 2012-04-11 DIAGNOSIS — L821 Other seborrheic keratosis: Secondary | ICD-10-CM | POA: Diagnosis not present

## 2012-04-11 DIAGNOSIS — Z85820 Personal history of malignant melanoma of skin: Secondary | ICD-10-CM | POA: Diagnosis not present

## 2012-05-14 ENCOUNTER — Ambulatory Visit (INDEPENDENT_AMBULATORY_CARE_PROVIDER_SITE_OTHER): Payer: Medicare Other | Admitting: Internal Medicine

## 2012-05-14 ENCOUNTER — Encounter: Payer: Self-pay | Admitting: Internal Medicine

## 2012-05-14 VITALS — BP 128/64 | HR 71 | Ht 61.5 in | Wt 114.4 lb

## 2012-05-14 DIAGNOSIS — R55 Syncope and collapse: Secondary | ICD-10-CM | POA: Diagnosis not present

## 2012-05-14 DIAGNOSIS — I442 Atrioventricular block, complete: Secondary | ICD-10-CM | POA: Diagnosis not present

## 2012-05-14 DIAGNOSIS — Z950 Presence of cardiac pacemaker: Secondary | ICD-10-CM | POA: Diagnosis not present

## 2012-05-14 DIAGNOSIS — T82190A Other mechanical complication of cardiac electrode, initial encounter: Secondary | ICD-10-CM | POA: Diagnosis not present

## 2012-05-14 LAB — PACEMAKER DEVICE OBSERVATION
AL AMPLITUDE: 4.3 mv
AL IMPEDENCE PM: 450 Ohm
AL THRESHOLD: 1 V
BATTERY VOLTAGE: 2.9629 V
RV LEAD AMPLITUDE: 12 mv
RV LEAD IMPEDENCE PM: 450 Ohm

## 2012-05-14 NOTE — Assessment & Plan Note (Signed)
No recurrent syncope 

## 2012-05-14 NOTE — Progress Notes (Signed)
Patient Care Team: Gaspar Garbe, MD as PCP - General (Internal Medicine)   HPI  Joy Newman is a 79 y.o. female Seen in followup for pacemaker implantation January 13 for syncope and intermittent complete heart block. She had carotid sinus hypersensitivity and antecedent PR prolongation suggestive of a neurally mediated trigger. His history of myocardial infarction and ischemic heart disease. Echo 2013 demonstrated normal left ventricular function  Doing well without recurrent syncope. She denies chest pain or shortness of breath Past Medical History  Diagnosis Date  . Hyperlipidemia   . Diastolic dysfunction   . CAD (coronary artery disease) no obstruction at cath     Hx of with an anteroapical M.I  . Pulmonary hypertension      Hx of Mild  . Tricuspid regurgitation     Moderate regurgitation  . Kidney stones   . Arteriovenous malformation of colon   . Diverticulosis   . Carotid sinus hypersensitivity   . Syncope   . Hypertension   . PVC and PACs   . Complete heart block-intermittent   . Pacemaker     St Jude  . MVP (mitral valve prolapse)   . Basal cell carcinoma   . Melanoma   . Squamous carcinoma     Past Surgical History  Procedure Laterality Date  . Appendectomy  1954    Hx of  . Tonsillectomy  1971    Hx of  . Abdominal hysterectomy  1980    Hx of  . Cardiac catheterization      Revealed an apical wall motion abnormality  . Cardiovascular stress test  02-21-2006    EF 84%, which revealed an apical defect  . Nasal reconstruction  1981  . Skin cancer excision      several, basal cell, squamous, melanoma  . Pacemaker insertion      Current Outpatient Prescriptions  Medication Sig Dispense Refill  . Ascorbic Acid (VITAMIN C) 1000 MG tablet Take 1,000 mg by mouth daily.        Marland Kitchen aspirin 81 MG tablet Take 81 mg by mouth daily.       Marland Kitchen BIOTIN PO Take 600 mcg by mouth 2 (two) times daily.      . Calcium 1500 MG tablet Take 1,500 mg by mouth 2  (two) times daily.        . cholecalciferol (VITAMIN D) 1000 UNITS tablet Take 2,000 Units by mouth daily.        Marland Kitchen diltiazem (CARDIZEM) 120 MG tablet Take 120 mg by mouth daily.      Marland Kitchen estrogens, conjugated, (PREMARIN) 0.3 MG tablet Take 0.3 mg by mouth 2 (two) times a week.       . Multiple Vitamins-Minerals (MULTIVITAMIN PO) Take 1 tablet by mouth daily.       . pantoprazole (PROTONIX) 40 MG tablet Take 40 mg by mouth daily.        . rosuvastatin (CRESTOR) 5 MG tablet Take 5 mg by mouth daily.      Marland Kitchen triamterene-hydrochlorothiazide (MAXZIDE-25) 37.5-25 MG per tablet Take 0.5 tablets by mouth daily.         No current facility-administered medications for this visit.    Allergies  Allergen Reactions  . Imdur (Isosorbide Mononitrate)     Intolerant and cause headaches and nausea  . Polysporin (Bacitracin-Polymyxin B)     Rash  . Quinine Derivatives     Increase in Heart Rate and Decrease in BP   . Tetracyclines & Related  Rash    Review of Systems negative except from HPI and PMH  Physical Exam BP 128/64  Pulse 71  Ht 5' 1.5" (1.562 m)  Wt 114 lb 6.4 oz (51.891 kg)  BMI 21.27 kg/m2  SpO2 99% Well developed and well nourished in no acute distress HENT normal E scleral and icterus clear Neck Supple JVP flat; carotids brisk and full Clear to ausculation Device pocket well healed; without hematoma or erythema Regular rate and rhythm, no murmurs gallops or rub Soft with active bowel sounds No clubbing cyanosis none Edema Alert and oriented, grossly normal motor and sensory function Skin Warm and Dry    Assessment and  Plan

## 2012-05-14 NOTE — Assessment & Plan Note (Signed)
The patient's device was interrogated.  The information was reviewed. No changes were made in the programming. No noises noted on manipulation the lead or the pulse generator I'm not sure where the [place] reversion is coming from. We'll follow

## 2012-05-14 NOTE — Patient Instructions (Signed)
Remote monitoring is used to monitor your Pacemaker of ICD from home. This monitoring reduces the number of office visits required to check your device to one time per year. It allows [place] to keep an eye on the functioning of your device to ensure it is working properly. You are scheduled for a device check from home on 08/12/12. You may send your transmission at any time that day. If you have a wireless device, the transmission will be sent automatically. After your physician reviews your transmission, you will receive a postcard with your next transmission date.   Your physician wants you to follow-up in: 1 year with Dr. Graciela Husbands. You will receive a reminder letter in the mail two months in advance. If you don't receive a letter, please call our office to schedule the follow-up appointment.   When you go home today press the white button twice on your transmitter.

## 2012-05-14 NOTE — Assessment & Plan Note (Signed)
Intermittent. With 4% ventricular pacing

## 2012-05-20 ENCOUNTER — Encounter: Payer: Self-pay | Admitting: Internal Medicine

## 2012-07-24 ENCOUNTER — Inpatient Hospital Stay (HOSPITAL_COMMUNITY)
Admission: EM | Admit: 2012-07-24 | Discharge: 2012-07-26 | DRG: 312 | Disposition: A | Discharge status: Home / Self Care | Payer: Medicare Other | Attending: Cardiovascular Disease | Admitting: Cardiovascular Disease

## 2012-07-24 ENCOUNTER — Encounter (HOSPITAL_COMMUNITY): Payer: Self-pay | Admitting: Vascular Surgery

## 2012-07-24 ENCOUNTER — Emergency Department (HOSPITAL_COMMUNITY): Payer: Medicare Other

## 2012-07-24 DIAGNOSIS — Z950 Presence of cardiac pacemaker: Secondary | ICD-10-CM | POA: Diagnosis not present

## 2012-07-24 DIAGNOSIS — S0993XA Unspecified injury of face, initial encounter: Secondary | ICD-10-CM | POA: Diagnosis not present

## 2012-07-24 DIAGNOSIS — I252 Old myocardial infarction: Secondary | ICD-10-CM

## 2012-07-24 DIAGNOSIS — I519 Heart disease, unspecified: Secondary | ICD-10-CM | POA: Diagnosis present

## 2012-07-24 DIAGNOSIS — I2510 Atherosclerotic heart disease of native coronary artery without angina pectoris: Secondary | ICD-10-CM | POA: Diagnosis present

## 2012-07-24 DIAGNOSIS — R404 Transient alteration of awareness: Secondary | ICD-10-CM | POA: Diagnosis not present

## 2012-07-24 DIAGNOSIS — Z85820 Personal history of malignant melanoma of skin: Secondary | ICD-10-CM

## 2012-07-24 DIAGNOSIS — Z79899 Other long term (current) drug therapy: Secondary | ICD-10-CM | POA: Diagnosis not present

## 2012-07-24 DIAGNOSIS — I059 Rheumatic mitral valve disease, unspecified: Secondary | ICD-10-CM | POA: Diagnosis present

## 2012-07-24 DIAGNOSIS — I951 Orthostatic hypotension: Secondary | ICD-10-CM | POA: Diagnosis present

## 2012-07-24 DIAGNOSIS — S0990XA Unspecified injury of head, initial encounter: Secondary | ICD-10-CM | POA: Diagnosis not present

## 2012-07-24 DIAGNOSIS — Z7982 Long term (current) use of aspirin: Secondary | ICD-10-CM

## 2012-07-24 DIAGNOSIS — I10 Essential (primary) hypertension: Secondary | ICD-10-CM | POA: Diagnosis present

## 2012-07-24 DIAGNOSIS — T148XXA Other injury of unspecified body region, initial encounter: Secondary | ICD-10-CM | POA: Diagnosis not present

## 2012-07-24 DIAGNOSIS — L089 Local infection of the skin and subcutaneous tissue, unspecified: Secondary | ICD-10-CM | POA: Diagnosis not present

## 2012-07-24 DIAGNOSIS — I079 Rheumatic tricuspid valve disease, unspecified: Secondary | ICD-10-CM | POA: Diagnosis present

## 2012-07-24 DIAGNOSIS — R55 Syncope and collapse: Secondary | ICD-10-CM | POA: Diagnosis present

## 2012-07-24 DIAGNOSIS — I2789 Other specified pulmonary heart diseases: Secondary | ICD-10-CM | POA: Diagnosis present

## 2012-07-24 DIAGNOSIS — K219 Gastro-esophageal reflux disease without esophagitis: Secondary | ICD-10-CM | POA: Diagnosis present

## 2012-07-24 DIAGNOSIS — I4891 Unspecified atrial fibrillation: Secondary | ICD-10-CM | POA: Diagnosis not present

## 2012-07-24 DIAGNOSIS — S199XXA Unspecified injury of neck, initial encounter: Secondary | ICD-10-CM | POA: Diagnosis not present

## 2012-07-24 DIAGNOSIS — E785 Hyperlipidemia, unspecified: Secondary | ICD-10-CM | POA: Diagnosis not present

## 2012-07-24 DIAGNOSIS — S298XXA Other specified injuries of thorax, initial encounter: Secondary | ICD-10-CM | POA: Diagnosis not present

## 2012-07-24 DIAGNOSIS — I359 Nonrheumatic aortic valve disorder, unspecified: Secondary | ICD-10-CM | POA: Diagnosis not present

## 2012-07-24 DIAGNOSIS — S0003XA Contusion of scalp, initial encounter: Secondary | ICD-10-CM | POA: Diagnosis not present

## 2012-07-24 HISTORY — DX: Orthostatic hypotension: I95.1

## 2012-07-24 HISTORY — DX: Acute myocardial infarction, unspecified: I21.9

## 2012-07-24 LAB — CBC WITH DIFFERENTIAL/PLATELET
Eosinophils Absolute: 0.1 10*3/uL (ref 0.0–0.7)
Eosinophils Relative: 0 % (ref 0–5)
HCT: 37.1 % (ref 36.0–46.0)
Lymphocytes Relative: 17 % (ref 12–46)
Lymphs Abs: 2.3 10*3/uL (ref 0.7–4.0)
MCH: 33.5 pg (ref 26.0–34.0)
MCV: 97.1 fL (ref 78.0–100.0)
Monocytes Absolute: 1.1 10*3/uL — ABNORMAL HIGH (ref 0.1–1.0)
Platelets: 230 10*3/uL (ref 150–400)
RBC: 3.82 MIL/uL — ABNORMAL LOW (ref 3.87–5.11)
WBC: 13.9 10*3/uL — ABNORMAL HIGH (ref 4.0–10.5)

## 2012-07-24 LAB — COMPREHENSIVE METABOLIC PANEL
BUN: 21 mg/dL (ref 6–23)
CO2: 30 mEq/L (ref 19–32)
Calcium: 9.5 mg/dL (ref 8.4–10.5)
Creatinine, Ser: 0.84 mg/dL (ref 0.50–1.10)
GFR calc Af Amer: 73 mL/min — ABNORMAL LOW (ref >90)
GFR calc non Af Amer: 63 mL/min — ABNORMAL LOW (ref >90)
Glucose, Bld: 95 mg/dL (ref 70–99)
Sodium: 140 mEq/L (ref 135–145)
Total Protein: 6.6 g/dL (ref 6.0–8.3)

## 2012-07-24 LAB — TROPONIN I
Troponin I: <0.3 ng/mL (ref <0.30)
Troponin I: <0.3 ng/mL (ref <0.30)

## 2012-07-24 LAB — CBC
MCH: 33.3 pg (ref 26.0–34.0)
MCHC: 34.6 g/dL (ref 30.0–36.0)
MCV: 96.3 fL (ref 78.0–100.0)
Platelets: 219 10*3/uL (ref 150–400)
RBC: 3.78 MIL/uL — ABNORMAL LOW (ref 3.87–5.11)

## 2012-07-24 LAB — CREATININE, SERUM: Creatinine, Ser: 0.89 mg/dL (ref 0.50–1.10)

## 2012-07-24 MED ORDER — ZOLPIDEM TARTRATE 5 MG PO TABS: 5.0000 mg | ORAL_TABLET | Freq: Every evening | ORAL | Status: DC | PRN

## 2012-07-24 MED ORDER — ONDANSETRON HCL 4 MG/2ML IJ SOLN: 4.0000 mg | Freq: Four times a day (QID) | INTRAMUSCULAR | Status: DC | PRN

## 2012-07-24 MED ORDER — ADULT MULTIVITAMIN W/MINERALS CH
1.0000 | ORAL_TABLET | Freq: Every day | ORAL | Status: DC
  Administered 2012-07-24 – 2012-07-26 (×3): 1 via ORAL
  Filled 2012-07-24 (×3): qty 1

## 2012-07-24 MED ORDER — TRIAMTERENE-HCTZ 37.5-25 MG PO TABS
0.5000 | ORAL_TABLET | Freq: Every day | ORAL | Status: DC
  Filled 2012-07-24: qty 0.5

## 2012-07-24 MED ORDER — VITAMIN C 500 MG PO TABS
1000.0000 mg | ORAL_TABLET | Freq: Every day | ORAL | Status: DC
Start: 2012-07-25 — End: 2012-07-26
  Administered 2012-07-25 – 2012-07-26 (×2): 1000 mg via ORAL
  Filled 2012-07-24 (×2): qty 2

## 2012-07-24 MED ORDER — CALCIUM CARBONATE 1250 (500 CA) MG PO TABS
1.0000 | ORAL_TABLET | Freq: Two times a day (BID) | ORAL | Status: DC
  Administered 2012-07-25 – 2012-07-26 (×3): 500 mg via ORAL
  Filled 2012-07-24 (×5): qty 1

## 2012-07-24 MED ORDER — ESTROGENS CONJUGATED 0.3 MG PO TABS
0.3000 mg | ORAL_TABLET | ORAL | Status: DC
  Administered 2012-07-25: 0.3 mg via ORAL
  Filled 2012-07-24 (×2): qty 1

## 2012-07-24 MED ORDER — SODIUM CHLORIDE 0.9 % IV SOLN: 250.0000 mL | INTRAVENOUS | Status: DC | PRN

## 2012-07-24 MED ORDER — ENOXAPARIN SODIUM 40 MG/0.4ML ~~LOC~~ SOLN
40.0000 mg | SUBCUTANEOUS | Status: DC
  Administered 2012-07-24: 40 mg via SUBCUTANEOUS
  Filled 2012-07-24 (×2): qty 0.4

## 2012-07-24 MED ORDER — DILTIAZEM HCL ER COATED BEADS 120 MG PO CP24
120.0000 mg | ORAL_CAPSULE | Freq: Every day | ORAL | Status: DC
  Administered 2012-07-25 – 2012-07-26 (×2): 120 mg via ORAL
  Filled 2012-07-24 (×2): qty 1

## 2012-07-24 MED ORDER — ASPIRIN 81 MG PO CHEW
81.0000 mg | CHEWABLE_TABLET | Freq: Every day | ORAL | Status: DC
  Administered 2012-07-25 – 2012-07-26 (×2): 81 mg via ORAL
  Filled 2012-07-24 (×3): qty 1

## 2012-07-24 MED ORDER — ALPRAZOLAM 0.25 MG PO TABS: 0.2500 mg | ORAL_TABLET | Freq: Two times a day (BID) | ORAL | Status: DC | PRN

## 2012-07-24 MED ORDER — VITAMIN D3 25 MCG (1000 UNIT) PO TABS
2000.0000 [IU] | ORAL_TABLET | Freq: Every day | ORAL | Status: DC
  Administered 2012-07-25 – 2012-07-26 (×2): 2000 [IU] via ORAL
  Filled 2012-07-24 (×2): qty 2

## 2012-07-24 MED ORDER — ACETAMINOPHEN 325 MG PO TABS: 650.0000 mg | ORAL_TABLET | ORAL | Status: DC | PRN

## 2012-07-24 MED ORDER — CALCIUM 1500 MG PO TABS: 1500.0000 mg | ORAL_TABLET | Freq: Two times a day (BID) | ORAL | Status: DC

## 2012-07-24 MED ORDER — DILTIAZEM HCL 60 MG PO TABS: 120.0000 mg | ORAL_TABLET | Freq: Every day | ORAL | Status: DC

## 2012-07-24 MED ORDER — SODIUM CHLORIDE 0.9 % IJ SOLN
3.0000 mL | Freq: Two times a day (BID) | INTRAMUSCULAR | Status: DC
  Administered 2012-07-24 – 2012-07-25 (×3): 3 mL via INTRAVENOUS

## 2012-07-24 MED ORDER — TRIAMTERENE-HCTZ 37.5-25 MG PO TABS: 0.5000 | ORAL_TABLET | Freq: Every day | ORAL | Status: DC

## 2012-07-24 MED ORDER — SODIUM CHLORIDE 0.9 % IJ SOLN: 3.0000 mL | INTRAMUSCULAR | Status: DC | PRN

## 2012-07-24 MED ORDER — ATORVASTATIN CALCIUM 10 MG PO TABS
10.0000 mg | ORAL_TABLET | Freq: Every day | ORAL | Status: DC
  Administered 2012-07-25 – 2012-07-26 (×2): 10 mg via ORAL
  Filled 2012-07-24 (×2): qty 1

## 2012-07-24 MED ORDER — BIOTIN 300 MCG PO TABS: 600.0000 ug | ORAL_TABLET | Freq: Two times a day (BID) | ORAL | Status: DC

## 2012-07-24 MED ORDER — NITROGLYCERIN 0.4 MG SL SUBL: 0.4000 mg | SUBLINGUAL_TABLET | SUBLINGUAL | Status: DC | PRN

## 2012-07-24 MED ORDER — PANTOPRAZOLE SODIUM 40 MG PO TBEC
40.0000 mg | DELAYED_RELEASE_TABLET | Freq: Every day | ORAL | Status: DC
  Administered 2012-07-24 – 2012-07-26 (×3): 40 mg via ORAL
  Filled 2012-07-24 (×3): qty 1

## 2012-07-24 NOTE — ED Notes (Addendum)
Pt reports to the ED via [facility] for eval of syncopal episode. Pt was out walking and she woke up on the sidewalk. Pt has had 2 of the episodes before and had a pacemaker placed. Pt has a Chief Technology Officer. Abrasion to her left cheek inferior to her left eye, laceration through her left lower lip, and an abrasion to her knee. Bleeding controlled. Pt A&O x4. 12 lead with EMS was NSR. CBG 90 mg/dl. Pt only complaining of facial pain. Pt denies any neck or back pain. Pt ambulatory PTA for a short distance. Pt denies any symptoms such as dizziness, CP, SOB, ect at this time.

## 2012-07-24 NOTE — ED Provider Notes (Signed)
History     CSN: [id number]  Arrival date & time 07/24/12  1157   First MD Initiated Contact with Patient 07/24/12 1236      Chief Complaint  Patient presents with  . Loss of Consciousness    (Consider location/radiation/quality/duration/timing/severity/associated sxs/prior treatment) Patient is a 79 y.o. female presenting with syncope. The history is provided by the patient (the pt states she was walking and fell on the ground.  she did not trip or get dizzy.  she woke up on the ground).  Loss of Consciousness  This is a new problem. The current episode started less than 1 hour ago. The problem occurs rarely. The problem has been resolved. She lost consciousness for a period of 1 to 5 minutes. The problem is associated with normal activity. Pertinent negatives include abdominal pain, back pain, chest pain, congestion, headaches, seizures and visual change. She has tried nothing for the symptoms.    Past Medical History  Diagnosis Date  . Hyperlipidemia   . Diastolic dysfunction   . CAD (coronary artery disease) no obstruction at cath     Hx of with an anteroapical M.I  . Pulmonary hypertension      Hx of Mild  . Tricuspid regurgitation     Moderate regurgitation  . Kidney stones   . Arteriovenous malformation of colon   . Diverticulosis   . Carotid sinus hypersensitivity   . Syncope   . Hypertension   . PVC and PACs   . Complete heart block-intermittent   . Pacemaker     St Jude  . MVP (mitral valve prolapse)   . Basal cell carcinoma   . Melanoma   . Squamous carcinoma     Past Surgical History  Procedure Laterality Date  . Appendectomy  1954    Hx of  . Tonsillectomy  1971    Hx of  . Abdominal hysterectomy  1980    Hx of  . Cardiac catheterization      Revealed an apical wall motion abnormality  . Cardiovascular stress test  02-21-2006    EF 84%, which revealed an apical defect  . Nasal reconstruction  1981  . Skin cancer excision      several, basal  cell, squamous, melanoma  . Pacemaker insertion      Family History  Problem Relation Age of Onset  . Stroke Mother   . Kidney disease Father   . Lung cancer Brother   . Kidney disease Sister   . Stroke Brother   . Heart disease Brother     x 2  . Liver cancer Sister     History  Substance Use Topics  . Smoking status: Never Smoker   . Smokeless tobacco: Never Used  . Alcohol Use: Yes     Comment: Rarely    OB History   Grav Para Term Preterm Abortions TAB SAB Ect Mult Living                  Review of Systems  Constitutional: Negative for appetite change and fatigue.  HENT: Negative for congestion, sinus pressure and ear discharge.   Eyes: Negative for discharge.  Respiratory: Negative for cough.   Cardiovascular: Positive for syncope. Negative for chest pain.  Gastrointestinal: Negative for abdominal pain and diarrhea.  Genitourinary: Negative for frequency and hematuria.  Musculoskeletal: Negative for back pain.  Skin: Negative for rash.  Neurological: Negative for seizures and headaches.  Psychiatric/Behavioral: Negative for hallucinations.    Allergies  Imdur;  Polysporin; Quinine derivatives; and Tetracyclines & related  Home Medications   Current Outpatient Rx  Name  Route  Sig  Dispense  Refill  . Ascorbic Acid (VITAMIN C) 1000 MG tablet   Oral   Take 1,000 mg by mouth daily.          Marland Kitchen aspirin 81 MG tablet   Oral   Take 81 mg by mouth daily.          Marland Kitchen BIOTIN PO   Oral   Take 600 mcg by mouth 2 (two) times daily.         . Calcium 1500 MG tablet   Oral   Take 1,500 mg by mouth 2 (two) times daily.          . cholecalciferol (VITAMIN D) 1000 UNITS tablet   Oral   Take 2,000 Units by mouth daily.          Marland Kitchen diltiazem (CARDIZEM) 120 MG tablet   Oral   Take 120 mg by mouth daily.         Marland Kitchen estrogens, conjugated, (PREMARIN) 0.3 MG tablet   Oral   Take 0.3 mg by mouth 2 (two) times a week.          Marland Kitchen ibuprofen  (ADVIL,MOTRIN) 200 MG tablet   Oral   Take 200 mg by mouth every 6 (six) hours as needed for pain.         . Multiple Vitamins-Minerals (MULTIVITAMIN PO)   Oral   Take 1 tablet by mouth daily.          . pantoprazole (PROTONIX) 40 MG tablet   Oral   Take 40 mg by mouth daily.          . rosuvastatin (CRESTOR) 5 MG tablet   Oral   Take 5 mg by mouth daily.         Marland Kitchen triamterene-hydrochlorothiazide (MAXZIDE-25) 37.5-25 MG per tablet   Oral   Take 0.5 tablets by mouth daily.            BP 99/69  Pulse 71  Temp(Src) 97.9 F (36.6 C) (Oral)  Resp 15  SpO2 98%  Physical Exam  Constitutional: She is oriented to person, place, and time. She appears well-developed.  HENT:  Head: Normocephalic.  facial abrasions  Eyes: Conjunctivae and EOM are normal. No scleral icterus.  Neck: Neck supple. No thyromegaly present.  Cardiovascular: Normal rate and regular rhythm.  Exam reveals no gallop and no friction rub.   No murmur heard. Pulmonary/Chest: No stridor. She has no wheezes. She has no rales. She exhibits no tenderness.  Abdominal: She exhibits no distension. There is no tenderness. There is no rebound.  Musculoskeletal: Normal range of motion. She exhibits no edema.  Lymphadenopathy:    She has no cervical adenopathy.  Neurological: She is oriented to person, place, and time. Coordination normal.  Skin: No rash noted. No erythema.  Psychiatric: She has a normal mood and affect. Her behavior is normal.    ED Course  Procedures (including critical care time)  Labs Reviewed  CBC WITH DIFFERENTIAL - Abnormal; Notable for the following:    WBC 13.9 (*)    RBC 3.82 (*)    Neutro Abs 10.4 (*)    Monocytes Absolute 1.1 (*)    All other components within normal limits  COMPREHENSIVE METABOLIC PANEL - Abnormal; Notable for the following:    Total Bilirubin 0.2 (*)    GFR calc non Af Amer 63 (*)  GFR calc Af Amer 73 (*)    All other components within normal limits   TROPONIN I   Ct Head Wo Contrast  07/24/2012  *RADIOLOGY REPORT*  Clinical Data:  Fall.  Syncope.  Trauma.  Abrasion to the left cheek and left globe.  Laceration to the lip.  CT HEAD WITHOUT CONTRAST CT MAXILLOFACIAL WITHOUT CONTRAST CT CERVICAL SPINE WITHOUT CONTRAST  Technique:  Multidetector CT imaging of the head, cervical spine, and maxillofacial structures were performed using the standard protocol without intravenous contrast. Multiplanar CT image reconstructions of the cervical spine and maxillofacial structures were also generated.  Comparison:  07/23/2011.  CT HEAD  Findings:  Study is mildly degraded by streak artifact. No mass lesion, mass effect, midline shift, hydrocephalus, hemorrhage.  No territorial ischemia or acute infarction.  IMPRESSION: Negative CT head.  CT MAXILLOFACIAL  Findings:  Dural calcification is present in the left middle cranial fossa.  Soft tissue stranding is present over the left cheek.  The globes and orbits appear within normal limits.  The mandibular condyles are located with bilateral temporomandibular joint degenerative disease and flattening of the condyles.  The both pterygoid plates are intact.  Mastoid air cells appear within normal limits.  Skull base appears normal.  Nasal bones are normal. Orbital floors and medial orbital walls are intact.  Intracranial contents appear within normal limits aside from atherosclerosis.  IMPRESSION: Soft tissue contusion to the left side of the face, predominately affecting the sheath.  No facial fracture.  Severe temporomandibular joint osteoarthritis.  CT CERVICAL SPINE  Findings:   The alignment cervical spine is unchanged compared to 07/23/2011.  2 mm anterolisthesis of C3 on C4. Between 1 mm and 2 mm anterolisthesis of C4 on C5.  Spondylolisthesis is degenerative, associated with chronic degenerative changes.  There is ankylosis of the C4-C5 facet joints bilaterally, with functional fusion.  The craniocervical junction is  within normal limits.  Odontoid appears normal.  Multilevel degenerative disc disease in the mid to lower cervical spine.  C3-C4 predominant facet arthrosis.  Central canal appears patent.  No central canal hematoma is identified. Bilateral pleural apical scarring.  Asymmetry at the atlantodental junction is a due to rotation.  Carotid atherosclerosis incidentally noted. Cranial settling is present with the tip of the odontoid projecting superior to the clivus.  IMPRESSION: No acute abnormality.  No cervical spine fracture or dislocation. Cervical spondylosis and facet arthrosis with ankylosis at C4-C5. Cranial settling.   Original Report Authenticated By: Andreas Newport, M.D.    Ct Cervical Spine Wo Contrast  07/24/2012  *RADIOLOGY REPORT*  Clinical Data:  Fall.  Syncope.  Trauma.  Abrasion to the left cheek and left globe.  Laceration to the lip.  CT HEAD WITHOUT CONTRAST CT MAXILLOFACIAL WITHOUT CONTRAST CT CERVICAL SPINE WITHOUT CONTRAST  Technique:  Multidetector CT imaging of the head, cervical spine, and maxillofacial structures were performed using the standard protocol without intravenous contrast. Multiplanar CT image reconstructions of the cervical spine and maxillofacial structures were also generated.  Comparison:  07/23/2011.  CT HEAD  Findings:  Study is mildly degraded by streak artifact. No mass lesion, mass effect, midline shift, hydrocephalus, hemorrhage.  No territorial ischemia or acute infarction.  IMPRESSION: Negative CT head.  CT MAXILLOFACIAL  Findings:  Dural calcification is present in the left middle cranial fossa.  Soft tissue stranding is present over the left cheek.  The globes and orbits appear within normal limits.  The mandibular condyles are located with bilateral temporomandibular joint degenerative  disease and flattening of the condyles.  The both pterygoid plates are intact.  Mastoid air cells appear within normal limits.  Skull base appears normal.  Nasal bones are normal.  Orbital floors and medial orbital walls are intact.  Intracranial contents appear within normal limits aside from atherosclerosis.  IMPRESSION: Soft tissue contusion to the left side of the face, predominately affecting the sheath.  No facial fracture.  Severe temporomandibular joint osteoarthritis.  CT CERVICAL SPINE  Findings:   The alignment cervical spine is unchanged compared to 07/23/2011.  2 mm anterolisthesis of C3 on C4. Between 1 mm and 2 mm anterolisthesis of C4 on C5.  Spondylolisthesis is degenerative, associated with chronic degenerative changes.  There is ankylosis of the C4-C5 facet joints bilaterally, with functional fusion.  The craniocervical junction is within normal limits.  Odontoid appears normal.  Multilevel degenerative disc disease in the mid to lower cervical spine.  C3-C4 predominant facet arthrosis.  Central canal appears patent.  No central canal hematoma is identified. Bilateral pleural apical scarring.  Asymmetry at the atlantodental junction is a due to rotation.  Carotid atherosclerosis incidentally noted. Cranial settling is present with the tip of the odontoid projecting superior to the clivus.  IMPRESSION: No acute abnormality.  No cervical spine fracture or dislocation. Cervical spondylosis and facet arthrosis with ankylosis at C4-C5. Cranial settling.   Original Report Authenticated By: Andreas Newport, M.D.    Dg Chest Port 1 View  07/24/2012  *RADIOLOGY REPORT*  Clinical Data: Fall.  Weakness.  PORTABLE CHEST - 1 VIEW  Comparison: Two-view chest 04/14/2011.  Findings: Pacing wires are stable.  Heart size is normal.  Mild emphysematous changes are stable.  No focal airspace disease is evident.  Biapical parenchymal scarring is unchanged.  Previously suggested nodular density at the left apex is not detected.  Advanced degenerative changes are noted at the humeral heads bilaterally.  IMPRESSION:  1.  No acute cardiopulmonary disease. 2.  Stable mild emphysema and apical  scarring. 3.  Postoperative changes of the right axilla. 4.  Advanced degenerative changes within the shoulders bilaterally.   Original Report Authenticated By: Marin Roberts, M.D.    Ct Maxillofacial Wo Cm  07/24/2012  *RADIOLOGY REPORT*  Clinical Data:  Fall.  Syncope.  Trauma.  Abrasion to the left cheek and left globe.  Laceration to the lip.  CT HEAD WITHOUT CONTRAST CT MAXILLOFACIAL WITHOUT CONTRAST CT CERVICAL SPINE WITHOUT CONTRAST  Technique:  Multidetector CT imaging of the head, cervical spine, and maxillofacial structures were performed using the standard protocol without intravenous contrast. Multiplanar CT image reconstructions of the cervical spine and maxillofacial structures were also generated.  Comparison:  07/23/2011.  CT HEAD  Findings:  Study is mildly degraded by streak artifact. No mass lesion, mass effect, midline shift, hydrocephalus, hemorrhage.  No territorial ischemia or acute infarction.  IMPRESSION: Negative CT head.  CT MAXILLOFACIAL  Findings:  Dural calcification is present in the left middle cranial fossa.  Soft tissue stranding is present over the left cheek.  The globes and orbits appear within normal limits.  The mandibular condyles are located with bilateral temporomandibular joint degenerative disease and flattening of the condyles.  The both pterygoid plates are intact.  Mastoid air cells appear within normal limits.  Skull base appears normal.  Nasal bones are normal. Orbital floors and medial orbital walls are intact.  Intracranial contents appear within normal limits aside from atherosclerosis.  IMPRESSION: Soft tissue contusion to the left side of the face, predominately  affecting the sheath.  No facial fracture.  Severe temporomandibular joint osteoarthritis.  CT CERVICAL SPINE  Findings:   The alignment cervical spine is unchanged compared to 07/23/2011.  2 mm anterolisthesis of C3 on C4. Between 1 mm and 2 mm anterolisthesis of C4 on C5.  Spondylolisthesis is  degenerative, associated with chronic degenerative changes.  There is ankylosis of the C4-C5 facet joints bilaterally, with functional fusion.  The craniocervical junction is within normal limits.  Odontoid appears normal.  Multilevel degenerative disc disease in the mid to lower cervical spine.  C3-C4 predominant facet arthrosis.  Central canal appears patent.  No central canal hematoma is identified. Bilateral pleural apical scarring.  Asymmetry at the atlantodental junction is a due to rotation.  Carotid atherosclerosis incidentally noted. Cranial settling is present with the tip of the odontoid projecting superior to the clivus.  IMPRESSION: No acute abnormality.  No cervical spine fracture or dislocation. Cervical spondylosis and facet arthrosis with ankylosis at C4-C5. Cranial settling.   Original Report Authenticated By: Andreas Newport, M.D.      No diagnosis found.    Date: 07/24/2012  Rate: 77  Rhythm: normal sinus rhythm  QRS Axis: normal  Intervals: normal  ST/T Wave abnormalities: normal  Conduction Disutrbances:none  Narrative Interpretation:   Old EKG Reviewed: unchanged   MDM          Benny Lennert, MD 07/24/12 1541

## 2012-07-24 NOTE — H&P (Signed)
CARDIOLOGY HISTORY AND PHYSICAL   Patient ID: Joy Newman MRN: [id number] DOB/AGE: October 03, 1927 79 y.o.  Admit date: 07/24/2012  Primary Physician   Gaspar Garbe, MD Primary Cardiologist   Nahser/SK Reason for Consultation   Syncope  Joy Newman is a 79 y.o. female with a history of CAD and syncope. She was in her usual state of health today. She was feeling well, had slept well last night, taken her usual morning medications and eaten and a good breakfast.  She was taking a walk and woke up on the ground. She had no prodrome. 2 people were present that had stopped to help. One of them was a Engineer, water who said she was confused at first but when she was evaluated by paramedics, they did not note any confusion. The patient felt she was alert and oriented when she awoke. She was aware of pain in her face from her fall but denied any other symptoms. Specifically, she denies chest pain, shortness of breath, presyncope, dizziness, weakness (unilateral or bilateral), confusion, visual disturbances or any other symptoms. She had no nausea or vomiting. She was transported by EMS to the emergency room. On arrival, her blood pressure was slightly low at 99 systolic but no other significant abnormalities were seen. Her blood pressure has improved and is currently elevated. On telemetry, she has occasional PVCs and paced beats but is otherwise in sinus rhythm without significant ectopy. Interrogation of her pacemaker is pending.   Past Medical History  Diagnosis Date  . Hyperlipidemia   . Diastolic dysfunction   . CAD (coronary artery disease) no obstruction at cath     Hx of with an anteroapical M.I  . Pulmonary hypertension      Hx of Mild  . Tricuspid regurgitation     Moderate regurgitation  . Kidney stones   . Arteriovenous malformation of colon   . Diverticulosis   . Carotid sinus hypersensitivity   . Syncope   . Hypertension   . PVC and PACs   .  Complete heart block-intermittent   . Pacemaker     St Jude  . MVP (mitral valve prolapse)   . Basal cell carcinoma   . Melanoma   . Squamous carcinoma      Past Surgical History  Procedure Laterality Date  . Appendectomy  1954    Hx of  . Tonsillectomy  1971    Hx of  . Abdominal hysterectomy  1980    Hx of  . Cardiac catheterization      Revealed an apical wall motion abnormality  . Cardiovascular stress test  02-21-2006    EF 84%, which revealed an apical defect  . Nasal reconstruction  1981  . Skin cancer excision      several, basal cell, squamous, melanoma  . Pacemaker insertion      Allergies  Allergen Reactions  . Imdur (Isosorbide Mononitrate)     Intolerant and cause headaches and nausea  . Polysporin (Bacitracin-Polymyxin B)     Rash  . Quinine Derivatives     Increase in Heart Rate and Decrease in BP   . Tetracyclines & Related     Rash    I have reviewed the patient's current medications  Prior to Admission medications   Medication Sig Start Date End Date Taking? Authorizing Provider  Ascorbic Acid (VITAMIN C) 1000 MG tablet Take 1,000 mg by mouth daily.    Yes Historical Provider, MD  aspirin 81 MG tablet Take 81 mg by mouth  daily.    Yes Historical Provider, MD  BIOTIN PO Take 600 mcg by mouth 2 (two) times daily.   Yes Historical Provider, MD  Calcium 1500 MG tablet Take 1,500 mg by mouth 2 (two) times daily.    Yes Historical Provider, MD  cholecalciferol (VITAMIN D) 1000 UNITS tablet Take 2,000 Units by mouth daily.    Yes Historical Provider, MD  diltiazem (CARDIZEM) 120 MG tablet Take 120 mg by mouth daily.   Yes Historical Provider, MD  estrogens, conjugated, (PREMARIN) 0.3 MG tablet Take 0.3 mg by mouth 2 (two) times a week.    Yes Historical Provider, MD  ibuprofen (ADVIL,MOTRIN) 200 MG tablet Take 200 mg by mouth every 6 (six) hours as needed for pain.   Yes Historical Provider, MD  Multiple Vitamins-Minerals (MULTIVITAMIN PO) Take 1 tablet  by mouth daily.    Yes Historical Provider, MD  pantoprazole (PROTONIX) 40 MG tablet Take 40 mg by mouth daily.    Yes Historical Provider, MD  rosuvastatin (CRESTOR) 5 MG tablet Take 5 mg by mouth daily.   Yes Historical Provider, MD  triamterene-hydrochlorothiazide (MAXZIDE-25) 37.5-25 MG per tablet Take 0.5 tablets by mouth daily.    Yes Historical Provider, MD     History   Social History  . Marital Status: Widowed    Spouse Name: N/A    Number of Children: 0  . Years of Education: N/A   Occupational History  . Retired OR Engineer, civil (consulting)    Social History Main Topics  . Smoking status: Never Smoker   . Smokeless tobacco: Never Used  . Alcohol Use: Yes     Comment: Rarely  . Drug Use: No  . Sexually Active: Not on file   Other Topics Concern  . Not on file   Social History Narrative   Retired Advertising account executive.  Clinical research associate.  Nondrinker.  Lives alone.  Has adopted son.    Family Status  Relation Status Death Age  . Mother Deceased 7    CVA  . Father Deceased 92    nephritis  . Brother Deceased     lung cancer  . Sister Deceased     nephritis   Family History  Problem Relation Age of Onset  . Stroke Mother   . Kidney disease Father   . Lung cancer Brother   . Kidney disease Sister   . Stroke Brother   . Heart disease Brother     x 2  . Liver cancer Sister      ROS:  Full 14 point review of systems complete and found to be negative unless listed above.  Physical Exam: Blood pressure 99/69, pulse 71, temperature 97.9 F (36.6 C), temperature source Oral, resp. rate 15, SpO2 98.00%.  General: Well developed, well nourished, female in no acute distress Head: Eyes PERRLA, No xanthomas.   Normocephalic, trauma to the face with abrasions, ecchymosis and some mild edema, no active bleeding, oropharynx without edema or exudate. Dentition: Good Lungs: Rales bases, no crackles or wheeze Heart: HRRR S1 S2, no rub/gallop, soft systolic murmur. pulses are 2+ extrem.   Neck:  Bilateral, right greater than left carotid bruits. No lymphadenopathy.  JVD not elevated. Abdomen: Bowel sounds present, abdomen soft and non-tender without masses or hernias noted. Msk:  No spine or cva tenderness. No weakness, no joint deformities or effusions. Extremities: No clubbing or cyanosis. No edema.  Neuro: Alert and oriented X 3. No focal deficits noted. Psych:  Good affect, responds appropriately Skin: No rashes  or lesions noted.  Labs:   Lab Results  Component Value Date   WBC 13.9* 07/24/2012   HGB 12.8 07/24/2012   HCT 37.1 07/24/2012   MCV 97.1 07/24/2012   PLT 230 07/24/2012   No results found for this basename: INR,  in the last 72 hours   Recent Labs Lab 07/24/12 1436  NA 140  K 3.7  CL 100  CO2 30  BUN 21  CREATININE 0.84  CALCIUM 9.5  PROT 6.6  BILITOT 0.2*  ALKPHOS 73  ALT 15  AST 29  GLUCOSE 95   Magnesium  Date Value Range Status  04/11/2011 1.9  1.5 - 2.5 mg/dL Final    Recent Labs  16/10/96 1436  TROPONINI <0.30   TSH  Date/Time Value Range Status  04/11/2011 10:23 PM 1.707  0.350 - 4.500 uIU/mL Final   Echo: 04/22/2011 Conclusions - Left ventricle: The cavity size was normal. Wall thickness was normal. Systolic function was normal. The estimated ejection fraction was in the range of 60% to 65%. Wall motion was normal; there were no regional wall motion abnormalities. Left ventricular diastolic function parameters were normal. - Mitral valve: Mildly calcified annulus. - Pulmonary arteries: PA peak pressure: 32mm Hg (S).   ECG: 24-Jul-2012 12:06:40  SINUS RHYTHM ~ normal P axis, V-rate 50- 99 RSR' IN V1 OR V2, PROBABLY NORMAL VARIANT ~ small R' only MINIMAL ST DEPRESSION, LATERAL LEADS ~ ST <-0.38mV, I aVL V5 V6 Vent. rate 77 BPM PR interval 188 ms QRS duration 78 ms QT/QTc 384/435 ms P-R-T axes 84 68 8    Radiology: 07/24/2012  *RADIOLOGY REPORT* CT HEAD  Findings:  Study is mildly degraded by streak artifact. No mass lesion,  mass effect, midline shift, hydrocephalus, hemorrhage.  No territorial ischemia or acute infarction.  IMPRESSION: Negative CT head.    Dg Chest Port 1 View 07/24/2012  *RADIOLOGY REPORT*  Clinical Data: Fall.  Weakness.  PORTABLE CHEST - 1 VIEW  Comparison: Two-view chest 04/14/2011.  Findings: Pacing wires are stable.  Heart size is normal.  Mild emphysematous changes are stable.  No focal airspace disease is evident.  Biapical parenchymal scarring is unchanged.  Previously suggested nodular density at the left apex is not detected.  Advanced degenerative changes are noted at the humeral heads bilaterally.  IMPRESSION:  1.  No acute cardiopulmonary disease. 2.  Stable mild emphysema and apical scarring. 3.  Postoperative changes of the right axilla. 4.  Advanced degenerative changes within the shoulders bilaterally.   Original Report Authenticated By: Marin Roberts, M.D.    Ct Maxillofacial Wo Cm 07/24/2012  *RADIOLOGY REPORT*  Clinical Data:  Fall.  Syncope.  Trauma.  Abrasion to the left cheek and left globe.  Laceration to the lip.  CT HEAD WITHOUT CONTRAST CT MAXILLOFACIAL WITHOUT CONTRAST CT CERVICAL SPINE WITHOUT CONTRAST  Technique:  Multidetector CT imaging of the head, cervical spine, and maxillofacial structures were performed using the standard protocol without intravenous contrast. Multiplanar CT image reconstructions of the cervical spine and maxillofacial structures were also generated.  Comparison:  07/23/2011.  CT HEAD  Findings:  Study is mildly degraded by streak artifact. No mass lesion, mass effect, midline shift, hydrocephalus, hemorrhage.  No territorial ischemia or acute infarction.  IMPRESSION: Negative CT head.  CT MAXILLOFACIAL  Findings:  Dural calcification is present in the left middle cranial fossa.  Soft tissue stranding is present over the left cheek.  The globes and orbits appear within normal limits.  The mandibular condyles  are located with bilateral temporomandibular  joint degenerative disease and flattening of the condyles.  The both pterygoid plates are intact.  Mastoid air cells appear within normal limits.  Skull base appears normal.  Nasal bones are normal. Orbital floors and medial orbital walls are intact.  Intracranial contents appear within normal limits aside from atherosclerosis.  IMPRESSION: Soft tissue contusion to the left side of the face, predominately affecting the sheath.  No facial fracture.  Severe temporomandibular joint osteoarthritis.    CT CERVICAL SPINE  Findings:   The alignment cervical spine is unchanged compared to 07/23/2011.  2 mm anterolisthesis of C3 on C4. Between 1 mm and 2 mm anterolisthesis of C4 on C5.  Spondylolisthesis is degenerative, associated with chronic degenerative changes.  There is ankylosis of the C4-C5 facet joints bilaterally, with functional fusion.  The craniocervical junction is within normal limits.  Odontoid appears normal.  Multilevel degenerative disc disease in the mid to lower cervical spine.  C3-C4 predominant facet arthrosis.  Central canal appears patent.  No central canal hematoma is identified. Bilateral pleural apical scarring.  Asymmetry at the atlantodental junction is a due to rotation.  Carotid atherosclerosis incidentally noted. Cranial settling is present with the tip of the odontoid projecting superior to the clivus.  IMPRESSION: No acute abnormality.  No cervical spine fracture or dislocation. Cervical spondylosis and facet arthrosis with ankylosis at C4-C5. Cranial settling.   Original Report Authenticated By: Andreas Newport, M.D.     ASSESSMENT AND PLAN:   The patient was seen today by Dr Eden Emms, the patient evaluated and the data reviewed.  Active Problems:   Syncope - Admit overnight, we will cycle cardiac enzymes but she has had no ischemic symptoms. Pacemaker has been interrogated and showed some rapid atrial fibrillation that was self-limiting. This did not occur at the time of her syncope.  There was no critical ectopy at the time of her syncope per pacemaker interrogation. Will check a TSH and echocardiogram. EP to see in a.m.   SignedTheodore Demark, PA-C 07/24/2012 4:57 PM Beeper [phone]  Co-Sign MD  Patient examined chart reviewed Events unclear. Fortunately she has a clear CT and no fracture of her cheek.  Spent quite a bit of time with pacer rep.  Last night she had some rapid afib.  Today there was a mode switch at 11:00 around the time of event but appears to be sinus tachycardia rate around 130.  No chest pain or dyspnea.  Aside from pain in head feels fine.  Telemetry in ER unrevealing Pacer working normally with LLR at 60  Will recheck echo and observe on telemetry . Labs otherwise ok.  Exam unremarkable except for pacer and echymosis on left mallor area  [employer]

## 2012-07-25 ENCOUNTER — Observation Stay (HOSPITAL_COMMUNITY): Payer: Medicare Other

## 2012-07-25 ENCOUNTER — Encounter (HOSPITAL_COMMUNITY): Payer: Self-pay

## 2012-07-25 ENCOUNTER — Inpatient Hospital Stay (HOSPITAL_COMMUNITY): Payer: Medicare Other

## 2012-07-25 DIAGNOSIS — I4891 Unspecified atrial fibrillation: Secondary | ICD-10-CM | POA: Diagnosis not present

## 2012-07-25 DIAGNOSIS — S0993XA Unspecified injury of face, initial encounter: Secondary | ICD-10-CM | POA: Diagnosis not present

## 2012-07-25 DIAGNOSIS — S199XXA Unspecified injury of neck, initial encounter: Secondary | ICD-10-CM | POA: Diagnosis not present

## 2012-07-25 DIAGNOSIS — R55 Syncope and collapse: Secondary | ICD-10-CM | POA: Diagnosis not present

## 2012-07-25 DIAGNOSIS — I359 Nonrheumatic aortic valve disorder, unspecified: Secondary | ICD-10-CM

## 2012-07-25 DIAGNOSIS — S0003XA Contusion of scalp, initial encounter: Secondary | ICD-10-CM | POA: Diagnosis not present

## 2012-07-25 DIAGNOSIS — I10 Essential (primary) hypertension: Secondary | ICD-10-CM | POA: Diagnosis not present

## 2012-07-25 DIAGNOSIS — E785 Hyperlipidemia, unspecified: Secondary | ICD-10-CM | POA: Diagnosis not present

## 2012-07-25 LAB — COMPREHENSIVE METABOLIC PANEL
ALT: 13 U/L (ref 0–35)
Alkaline Phosphatase: 65 U/L (ref 39–117)
BUN: 16 mg/dL (ref 6–23)
CO2: 33 mEq/L — ABNORMAL HIGH (ref 19–32)
Calcium: 9.4 mg/dL (ref 8.4–10.5)
GFR calc Af Amer: 65 mL/min — ABNORMAL LOW (ref >90)
GFR calc non Af Amer: 56 mL/min — ABNORMAL LOW (ref >90)
Glucose, Bld: 96 mg/dL (ref 70–99)
Potassium: 3.6 mEq/L (ref 3.5–5.1)
Sodium: 142 mEq/L (ref 135–145)
Total Protein: 6.1 g/dL (ref 6.0–8.3)

## 2012-07-25 LAB — HEMOGLOBIN A1C: Hgb A1c MFr Bld: 5.7 % — ABNORMAL HIGH (ref <5.7)

## 2012-07-25 LAB — TROPONIN I: Troponin I: <0.3 ng/mL (ref <0.30)

## 2012-07-25 LAB — TSH: TSH: 2.24 u[IU]/mL (ref 0.350–4.500)

## 2012-07-25 MED ORDER — IOHEXOL 350 MG/ML SOLN
50.0000 mL | Freq: Once | INTRAVENOUS | Status: AC | PRN
  Administered 2012-07-25: 50 mL via INTRAVENOUS

## 2012-07-25 MED ORDER — ENOXAPARIN SODIUM 30 MG/0.3ML ~~LOC~~ SOLN
30.0000 mg | SUBCUTANEOUS | Status: DC
  Administered 2012-07-25: 30 mg via SUBCUTANEOUS
  Filled 2012-07-25 (×2): qty 0.3

## 2012-07-25 NOTE — Progress Notes (Addendum)
PROGRESS NOTE  Subjective:   Joy Newman is an 79 yo with hx of MVP , syncope, pacemaker, and hyperlipidemia.  She was admitted yesterday after having another episode of syncope.   Joy Newman has a hx of syncope in the past.  In Jan. 2013 she had several syncopal episodes and was found to have complete heart block.  She had a pacer placed .    About 1 month later she fell again - the son-in -law thinks this was simply as mis-step while she was going down a set of uneven stairs.  Yesterday, she was out walking and had walked for several miles.  She felt fine prior to the episodes.    She was walking along and the next thing she knew she was on the pavement with 2 people standing over her.   There was no warning.  She had no dizziness or lightheadeness.    She was brought to the ED.  Pacer interogation revealed some episodes of paroxysmal Atrial fib that occurred while she was sleeping 1 night but otherwise normal pacer function.  There were no ventricular arrhythmias.  She feels fine this am.  Objective:    Vital Signs:   Temp:  [97.9 F (36.6 C)-98.2 F (36.8 C)] 98.2 F (36.8 C) (04/24 0500) Pulse Rate:  [67-89] 78 (04/24 0500) Resp:  [14-22] 18 (04/24 0500) BP: (99-168)/(43-107) 125/67 mmHg (04/24 0500) SpO2:  [94 %-100 %] 94 % (04/24 0500) Weight:  [113 lb (51.256 kg)] 113 lb (51.256 kg) (04/24 0500)  Last BM Date: 07/24/12   24-hour weight change: Weight change:   Weight trends: Filed Weights   07/24/12 2102 07/25/12 0500  Weight: 113 lb (51.256 kg) 113 lb (51.256 kg)    Intake/Output:  04/23 0701 - 04/24 0700 In: 3 [I.V.:3] Out: -      Physical Exam: BP 125/67  Pulse 78  Temp(Src) 98.2 F (36.8 C) (Oral)  Resp 18  Ht 5\' 1"  (1.549 m)  Wt 113 lb (51.256 kg)  BMI 21.36 kg/m2  SpO2 94%  General: Vital signs reviewed and noted.   Head: Normocephalic, she has abrasions on the left side of her face  Eyes: conjunctivae/corneas clear.  EOM's intact.   Throat: normal    Neck:  normal  Lungs:   clear  Heart:  RR, soft murmur  Abdomen:  Soft, non-tender, non-distended    Extremities: No edema   Neurologic: A&O X3, CN II - XII are grossly intact.   Psych: Normal     Labs: BMET:  Recent Labs  07/24/12 1436 07/24/12 2026 07/25/12 0206  NA 140  --  142  K 3.7  --  3.6  CL 100  --  100  CO2 30  --  33*  GLUCOSE 95  --  96  BUN 21  --  16  CREATININE 0.84 0.89 0.93  CALCIUM 9.5  --  9.4    Liver function tests:  Recent Labs  07/24/12 1436 07/25/12 0206  AST 29 24  ALT 15 13  ALKPHOS 73 65  BILITOT 0.2* 0.3  PROT 6.6 6.1  ALBUMIN 3.8 3.5   No results found for this basename: LIPASE, AMYLASE,  in the last 72 hours  CBC:  Recent Labs  07/24/12 1436 07/24/12 2026  WBC 13.9* 10.2  NEUTROABS 10.4*  --   HGB 12.8 12.6  HCT 37.1 36.4  MCV 97.1 96.3  PLT 230 219    Cardiac Enzymes:  Recent Labs  07/24/12 1436  07/24/12 2025 07/25/12 0206  TROPONINI <0.30 <0.30 <0.30    Coagulation Studies: No results found for this basename: LABPROT, INR,  in the last 72 hours  Other: No components found with this basename: POCBNP,  No results found for this basename: DDIMER,  in the last 72 hours  Recent Labs  07/24/12 2026  HGBA1C 5.7*   No results found for this basename: CHOL, HDL, LDLCALC, TRIG, CHOLHDL,  in the last 72 hours  Recent Labs  07/24/12 2026  TSH 2.240   No results found for this basename: VITAMINB12, FOLATE, FERRITIN, TIBC, IRON, RETICCTPCT,  in the last 72 hours   Other results:  Tele:  NSR  Medications:    Infusions:    Scheduled Medications: . aspirin  81 mg Oral Daily  . atorvastatin  10 mg Oral Daily  . calcium carbonate  1 tablet Oral BID WC  . cholecalciferol  2,000 Units Oral Daily  . diltiazem  120 mg Oral Daily  . enoxaparin (LOVENOX) injection  40 mg Subcutaneous Q24H  . estrogens (conjugated)  0.3 mg Oral 2 times weekly  . multivitamin with minerals  1 tablet Oral Daily  .  pantoprazole  40 mg Oral Daily  . sodium chloride  3 mL Intravenous Q12H  . vitamin C  1,000 mg Oral Daily    Assessment/ Plan:      Syncope: the etiology is unclear at this point.  This clearly was not related to a cardiac arrhythmia.  ? Due to orthostasis but the episode occurred after she had been walking for a while.   She has a hx of carotid hypersensitivity but I'm not sure how this may have contributed to her syncope yesterday.  HB is normal TSH is normal Electrolytes are normal   I will hold Maxzide for now.  I would rather her have a slightly higher than normal BP than episodes of syncope which could lead to life threatening consequences. I have requested a neuro consult. I have asked her medical doctor to come by to help [place] think through this issue.   2. Hyperlipidemia: 3. Mild Pulmonary hypertension - echo has been ordered .     Length of Stay: 1  Vesta Mixer, Montez Hageman., MD, [facility] 07/25/2012, 7:58 AM Office [phone] Pager [phone]

## 2012-07-25 NOTE — Progress Notes (Addendum)
NEURO HOSPITALIST CONSULT NOTE    Reason for Consult:syncope of unknown etiology after cardiac workup negative  HPI:                                                                                                                                          Joy Newman is an 79 y.o. female who has known carotid sensitivity and complete heart block S/P pacemaker. Last year she was having multiple syncopal episodes and patient had a full workup by cardiology.  Due to carotid sinus hypersensitivity and the syncope along with recurrent episodes of documented complete heart block associated with EEG, it was recommended she have a pacemaker. Patient was syncope free for over one year.  Yesterday she was walking when she suddenly collapsed. She suddenly collapsed with no aura or  Prodrome.  She awoke to two bystanders telling her not to get up.  On arrival to ED , her blood pressure was slightly low at 99 systolic but no other significant abnormalities were seen. On telemetry, she has occasional PVCs and paced beats but is otherwise in sinus rhythm without significant ectopy.  She denies any postictal period, urinary incontinence or visualized shaking.  She does not recall falling.  She has a significant amount of soft tissue swelling on her left face but CT head and neck show no fracture or intracranial abnormality. She denies any previous periods where people may have mentions staring spells or abnormal behavior.   OF note: pacer interrogation did show PAF overnight but was not associated with the event   Past Medical History  Diagnosis Date  . Hyperlipidemia   . Diastolic dysfunction   . CAD (coronary artery disease) no obstruction at cath     Hx of with an anteroapical M.I  . Pulmonary hypertension      Hx of Mild  . Tricuspid regurgitation     Moderate regurgitation  . Kidney stones   . Arteriovenous malformation of colon   . Diverticulosis   . Carotid sinus  hypersensitivity   . Syncope   . Hypertension   . PVC and PACs   . Complete heart block-intermittent   . Pacemaker     [employer] Jude  . MVP (mitral valve prolapse)   . Basal cell carcinoma   . Melanoma   . Squamous carcinoma   . Myocardial infarction     2005    Past Surgical History  Procedure Laterality Date  . Appendectomy  1954    Hx of  . Tonsillectomy  1971    Hx of  . Abdominal hysterectomy  1980    Hx of  . Cardiac catheterization      Revealed an apical wall motion abnormality  . Cardiovascular stress test  02-21-2006    EF 84%,  which revealed an apical defect  . Nasal reconstruction  1981  . Skin cancer excision      several, basal cell, squamous, melanoma  . Pacemaker insertion      Family History  Problem Relation Age of Onset  . Stroke Mother   . Kidney disease Father   . Lung cancer Brother   . Kidney disease Sister   . Stroke Brother   . Heart disease Brother     x 2  . Liver cancer Sister      Social History:  reports that she has never smoked. She has never used smokeless tobacco. She reports that  drinks alcohol. She reports that she does not use illicit drugs.  Allergies  Allergen Reactions  . Imdur (Isosorbide Mononitrate)     Intolerant and cause headaches and nausea  . Polysporin (Bacitracin-Polymyxin B)     Rash  . Quinine Derivatives     Increase in Heart Rate and Decrease in BP   . Tetracyclines & Related     Rash    MEDICATIONS:                                                                                                                     Prior to Admission:  Prescriptions prior to admission  Medication Sig Dispense Refill  . Ascorbic Acid (VITAMIN C) 1000 MG tablet Take 1,000 mg by mouth daily.       Marland Kitchen aspirin 81 MG tablet Take 81 mg by mouth daily.       Marland Kitchen BIOTIN PO Take 600 mcg by mouth 2 (two) times daily.      . Calcium 1500 MG tablet Take 1,500 mg by mouth 2 (two) times daily.       . cholecalciferol (VITAMIN D) 1000  UNITS tablet Take 2,000 Units by mouth daily.       Marland Kitchen diltiazem (CARDIZEM LA) 120 MG 24 hr tablet Take 120 mg by mouth daily.      Marland Kitchen estrogens, conjugated, (PREMARIN) 0.3 MG tablet Take 0.3 mg by mouth 2 (two) times a week.       Marland Kitchen ibuprofen (ADVIL,MOTRIN) 200 MG tablet Take 200 mg by mouth every 6 (six) hours as needed for pain.      . Multiple Vitamins-Minerals (MULTIVITAMIN PO) Take 1 tablet by mouth daily.       . pantoprazole (PROTONIX) 40 MG tablet Take 40 mg by mouth daily.       . rosuvastatin (CRESTOR) 5 MG tablet Take 5 mg by mouth daily.      Marland Kitchen triamterene-hydrochlorothiazide (MAXZIDE-25) 37.5-25 MG per tablet Take 0.5 tablets by mouth daily.        Scheduled: . aspirin  81 mg Oral Daily  . atorvastatin  10 mg Oral Daily  . calcium carbonate  1 tablet Oral BID WC  . cholecalciferol  2,000 Units Oral Daily  . diltiazem  120 mg Oral Daily  . enoxaparin (LOVENOX) injection  30 mg Subcutaneous Q24H  .  estrogens (conjugated)  0.3 mg Oral 2 times weekly  . multivitamin with minerals  1 tablet Oral Daily  . pantoprazole  40 mg Oral Daily  . sodium chloride  3 mL Intravenous Q12H  . vitamin C  1,000 mg Oral Daily     ROS:                                                                                                                                       History obtained from the patient  General ROS: negative for - chills, fatigue, fever, night sweats, weight gain or weight loss Psychological ROS: negative for - behavioral disorder, hallucinations, memory difficulties, mood swings or suicidal ideation Ophthalmic ROS: negative for - blurry vision, double vision, eye pain or loss of vision ENT ROS: negative for - epistaxis, nasal discharge, oral lesions, sore throat, tinnitus or vertigo Allergy and Immunology ROS: negative for - hives or itchy/watery eyes Hematological and Lymphatic ROS: negative for - bleeding problems, bruising or swollen lymph nodes Endocrine ROS: negative for -  galactorrhea, hair pattern changes, polydipsia/polyuria or temperature intolerance Respiratory ROS: negative for - cough, hemoptysis, shortness of breath or wheezing Cardiovascular ROS: negative for - chest pain, dyspnea on exertion, edema or irregular heartbeat Gastrointestinal ROS: negative for - abdominal pain, diarrhea, hematemesis, nausea/vomiting or stool incontinence Genito-Urinary ROS: negative for - dysuria, hematuria, incontinence or urinary frequency/urgency Musculoskeletal ROS: negative for - joint swelling or muscular weakness Neurological ROS: as noted in HPI Dermatological ROS: negative for rash and skin lesion changes   Blood pressure 112/60, pulse 73, temperature 98.2 F (36.8 C), temperature source Oral, resp. rate 18, height 5\' 1"  (1.549 m), weight 51.256 kg (113 lb), SpO2 94.00%.   Neurologic Examination:                                                                                                      Mental Status: Alert, oriented, thought content appropriate.  Speech fluent without evidence of aphasia.  Able to follow 3 step commands without difficulty. Cranial Nerves: II: Discs flat bilaterally; Visual fields grossly normal, pupils unequal right=41mm left=52mm, round, reactive to light and accommodation III,IV, VI: ptosis not present, extra-ocular motions intact --decreased upward gaze V,VII: smile symmetric, facial light touch sensation normal bilaterally VIII: hearing normal bilaterally IX,X: gag reflex present XI: bilateral shoulder shrug XII: midline tongue extension Motor: Right : Upper extremity   4/5    Left:     Upper extremity   5/5  Lower extremity   5/5     Lower extremity   5/5 Tone and bulk:normal tone throughout; no atrophy noted Sensory: Pinprick and light touch intact throughout, bilaterally, LE vibratory and proprioception intact Deep Tendon Reflexes: 2+ brisk and symmetric throughout Plantars: Right: downgoing   Left:  downgoing Cerebellar: normal finger-to-nose,  normal heel-to-shin test Gait: slightly wide based CV: pulses palpable throughout    Lab Results  Component Value Date/Time   CHOL 210* 11/20/2011  8:59 AM    Results for orders placed during the hospital encounter of 07/24/12 (from the past 48 hour(s))  CBC WITH DIFFERENTIAL     Status: Abnormal   Collection Time    07/24/12  2:36 PM      Result Value Range   WBC 13.9 (*) 4.0 - 10.5 K/uL   RBC 3.82 (*) 3.87 - 5.11 MIL/uL   Hemoglobin 12.8  12.0 - 15.0 g/dL   HCT 02.7  25.3 - 66.4 %   MCV 97.1  78.0 - 100.0 fL   MCH 33.5  26.0 - 34.0 pg   MCHC 34.5  30.0 - 36.0 g/dL   RDW 40.3  47.4 - 25.9 %   Platelets 230  150 - 400 K/uL   Neutrophils Relative 75  43 - 77 %   Neutro Abs 10.4 (*) 1.7 - 7.7 K/uL   Lymphocytes Relative 17  12 - 46 %   Lymphs Abs 2.3  0.7 - 4.0 K/uL   Monocytes Relative 8  3 - 12 %   Monocytes Absolute 1.1 (*) 0.1 - 1.0 K/uL   Eosinophils Relative 0  0 - 5 %   Eosinophils Absolute 0.1  0.0 - 0.7 K/uL   Basophils Relative 0  0 - 1 %   Basophils Absolute 0.1  0.0 - 0.1 K/uL  COMPREHENSIVE METABOLIC PANEL     Status: Abnormal   Collection Time    07/24/12  2:36 PM      Result Value Range   Sodium 140  135 - 145 mEq/L   Potassium 3.7  3.5 - 5.1 mEq/L   Chloride 100  96 - 112 mEq/L   CO2 30  19 - 32 mEq/L   Glucose, Bld 95  70 - 99 mg/dL   BUN 21  6 - 23 mg/dL   Creatinine, Ser 5.63  0.50 - 1.10 mg/dL   Calcium 9.5  8.4 - 87.5 mg/dL   Total Protein 6.6  6.0 - 8.3 g/dL   Albumin 3.8  3.5 - 5.2 g/dL   AST 29  0 - 37 U/L   ALT 15  0 - 35 U/L   Alkaline Phosphatase 73  39 - 117 U/L   Total Bilirubin 0.2 (*) 0.3 - 1.2 mg/dL   GFR calc non Af Amer 63 (*) >90 mL/min   GFR calc Af Amer 73 (*) >90 mL/min   Comment:            The eGFR has been calculated     using the CKD EPI equation.     This calculation has not been     validated in all clinical     situations.     eGFR's persistently     <90 mL/min  signify     possible Chronic Kidney Disease.  TROPONIN I     Status: None   Collection Time    07/24/12  2:36 PM      Result Value Range   Troponin I <0.30  <0.30 ng/mL  Comment:            Due to the release kinetics of cTnI,     a negative result within the first hours     of the onset of symptoms does not rule out     myocardial infarction with certainty.     If myocardial infarction is still suspected,     repeat the test at appropriate intervals.  TROPONIN I     Status: None   Collection Time    07/24/12  8:25 PM      Result Value Range   Troponin I <0.30  <0.30 ng/mL   Comment:            Due to the release kinetics of cTnI,     a negative result within the first hours     of the onset of symptoms does not rule out     myocardial infarction with certainty.     If myocardial infarction is still suspected,     repeat the test at appropriate intervals.  TSH     Status: None   Collection Time    07/24/12  8:26 PM      Result Value Range   TSH 2.240  0.350 - 4.500 uIU/mL  HEMOGLOBIN A1C     Status: Abnormal   Collection Time    07/24/12  8:26 PM      Result Value Range   Hemoglobin A1C 5.7 (*) <5.7 %   Comment: (NOTE)                                                                               According to the ADA Clinical Practice Recommendations for 2011, when     HbA1c is used as a screening test:      >=6.5%   Diagnostic of Diabetes Mellitus               (if abnormal result is confirmed)     5.7-6.4%   Increased risk of developing Diabetes Mellitus     References:Diagnosis and Classification of Diabetes Mellitus,Diabetes     Care,2011,34(Suppl 1):S62-S69 and Standards of Medical Care in             Diabetes - 2011,Diabetes Care,2011,34 (Suppl 1):S11-S61.     CORRECTED ON 04/24 AT 0133: PREVIOUSLY REPORTED AS 5.7 Reference range: <5.7   Mean Plasma Glucose 117 (*) <117 mg/dL  CBC     Status: Abnormal   Collection Time    07/24/12  8:26 PM      Result Value  Range   WBC 10.2  4.0 - 10.5 K/uL   RBC 3.78 (*) 3.87 - 5.11 MIL/uL   Hemoglobin 12.6  12.0 - 15.0 g/dL   HCT 16.1  09.6 - 04.5 %   MCV 96.3  78.0 - 100.0 fL   MCH 33.3  26.0 - 34.0 pg   MCHC 34.6  30.0 - 36.0 g/dL   RDW 40.9  81.1 - 91.4 %   Platelets 219  150 - 400 K/uL  CREATININE, SERUM     Status: Abnormal   Collection Time    07/24/12  8:26 PM      Result Value  Range   Creatinine, Ser 0.89  0.50 - 1.10 mg/dL   GFR calc non Af Amer 59 (*) >90 mL/min   GFR calc Af Amer 68 (*) >90 mL/min   Comment:            The eGFR has been calculated     using the CKD EPI equation.     This calculation has not been     validated in all clinical     situations.     eGFR's persistently     <90 mL/min signify     possible Chronic Kidney Disease.  TROPONIN I     Status: None   Collection Time    07/25/12  2:06 AM      Result Value Range   Troponin I <0.30  <0.30 ng/mL   Comment:            Due to the release kinetics of cTnI,     a negative result within the first hours     of the onset of symptoms does not rule out     myocardial infarction with certainty.     If myocardial infarction is still suspected,     repeat the test at appropriate intervals.  COMPREHENSIVE METABOLIC PANEL     Status: Abnormal   Collection Time    07/25/12  2:06 AM      Result Value Range   Sodium 142  135 - 145 mEq/L   Potassium 3.6  3.5 - 5.1 mEq/L   Chloride 100  96 - 112 mEq/L   CO2 33 (*) 19 - 32 mEq/L   Glucose, Bld 96  70 - 99 mg/dL   BUN 16  6 - 23 mg/dL   Creatinine, Ser 4.09  0.50 - 1.10 mg/dL   Calcium 9.4  8.4 - 81.1 mg/dL   Total Protein 6.1  6.0 - 8.3 g/dL   Albumin 3.5  3.5 - 5.2 g/dL   AST 24  0 - 37 U/L   ALT 13  0 - 35 U/L   Alkaline Phosphatase 65  39 - 117 U/L   Total Bilirubin 0.3  0.3 - 1.2 mg/dL   GFR calc non Af Amer 56 (*) >90 mL/min   GFR calc Af Amer 65 (*) >90 mL/min   Comment:            The eGFR has been calculated     using the CKD EPI equation.     This  calculation has not been     validated in all clinical     situations.     eGFR's persistently     <90 mL/min signify     possible Chronic Kidney Disease.  TROPONIN I     Status: None   Collection Time    07/25/12  8:45 AM      Result Value Range   Troponin I <0.30  <0.30 ng/mL   Comment:            Due to the release kinetics of cTnI,     a negative result within the first hours     of the onset of symptoms does not rule out     myocardial infarction with certainty.     If myocardial infarction is still suspected,     repeat the test at appropriate intervals.    Ct Head Wo Contrast  07/24/2012  *RADIOLOGY REPORT*  Clinical Data:  Fall.  Syncope.  Trauma.  Abrasion to the left  cheek and left globe.  Laceration to the lip.  CT HEAD WITHOUT CONTRAST CT MAXILLOFACIAL WITHOUT CONTRAST CT CERVICAL SPINE WITHOUT CONTRAST  Technique:  Multidetector CT imaging of the head, cervical spine, and maxillofacial structures were performed using the standard protocol without intravenous contrast. Multiplanar CT image reconstructions of the cervical spine and maxillofacial structures were also generated.  Comparison:  07/23/2011.  CT HEAD  Findings:  Study is mildly degraded by streak artifact. No mass lesion, mass effect, midline shift, hydrocephalus, hemorrhage.  No territorial ischemia or acute infarction.  IMPRESSION: Negative CT head.  CT MAXILLOFACIAL  Findings:  Dural calcification is present in the left middle cranial fossa.  Soft tissue stranding is present over the left cheek.  The globes and orbits appear within normal limits.  The mandibular condyles are located with bilateral temporomandibular joint degenerative disease and flattening of the condyles.  The both pterygoid plates are intact.  Mastoid air cells appear within normal limits.  Skull base appears normal.  Nasal bones are normal. Orbital floors and medial orbital walls are intact.  Intracranial contents appear within normal limits aside  from atherosclerosis.  IMPRESSION: Soft tissue contusion to the left side of the face, predominately affecting the sheath.  No facial fracture.  Severe temporomandibular joint osteoarthritis.  CT CERVICAL SPINE  Findings:   The alignment cervical spine is unchanged compared to 07/23/2011.  2 mm anterolisthesis of C3 on C4. Between 1 mm and 2 mm anterolisthesis of C4 on C5.  Spondylolisthesis is degenerative, associated with chronic degenerative changes.  There is ankylosis of the C4-C5 facet joints bilaterally, with functional fusion.  The craniocervical junction is within normal limits.  Odontoid appears normal.  Multilevel degenerative disc disease in the mid to lower cervical spine.  C3-C4 predominant facet arthrosis.  Central canal appears patent.  No central canal hematoma is identified. Bilateral pleural apical scarring.  Asymmetry at the atlantodental junction is a due to rotation.  Carotid atherosclerosis incidentally noted. Cranial settling is present with the tip of the odontoid projecting superior to the clivus.  IMPRESSION: No acute abnormality.  No cervical spine fracture or dislocation. Cervical spondylosis and facet arthrosis with ankylosis at C4-C5. Cranial settling.   Original Report Authenticated By: Andreas Newport, M.D.    Ct Cervical Spine Wo Contrast  07/24/2012  *RADIOLOGY REPORT*  Clinical Data:  Fall.  Syncope.  Trauma.  Abrasion to the left cheek and left globe.  Laceration to the lip.  CT HEAD WITHOUT CONTRAST CT MAXILLOFACIAL WITHOUT CONTRAST CT CERVICAL SPINE WITHOUT CONTRAST  Technique:  Multidetector CT imaging of the head, cervical spine, and maxillofacial structures were performed using the standard protocol without intravenous contrast. Multiplanar CT image reconstructions of the cervical spine and maxillofacial structures were also generated.  Comparison:  07/23/2011.  CT HEAD  Findings:  Study is mildly degraded by streak artifact. No mass lesion, mass effect, midline shift,  hydrocephalus, hemorrhage.  No territorial ischemia or acute infarction.  IMPRESSION: Negative CT head.  CT MAXILLOFACIAL  Findings:  Dural calcification is present in the left middle cranial fossa.  Soft tissue stranding is present over the left cheek.  The globes and orbits appear within normal limits.  The mandibular condyles are located with bilateral temporomandibular joint degenerative disease and flattening of the condyles.  The both pterygoid plates are intact.  Mastoid air cells appear within normal limits.  Skull base appears normal.  Nasal bones are normal. Orbital floors and medial orbital walls are intact.  Intracranial contents appear  within normal limits aside from atherosclerosis.  IMPRESSION: Soft tissue contusion to the left side of the face, predominately affecting the sheath.  No facial fracture.  Severe temporomandibular joint osteoarthritis.  CT CERVICAL SPINE  Findings:   The alignment cervical spine is unchanged compared to 07/23/2011.  2 mm anterolisthesis of C3 on C4. Between 1 mm and 2 mm anterolisthesis of C4 on C5.  Spondylolisthesis is degenerative, associated with chronic degenerative changes.  There is ankylosis of the C4-C5 facet joints bilaterally, with functional fusion.  The craniocervical junction is within normal limits.  Odontoid appears normal.  Multilevel degenerative disc disease in the mid to lower cervical spine.  C3-C4 predominant facet arthrosis.  Central canal appears patent.  No central canal hematoma is identified. Bilateral pleural apical scarring.  Asymmetry at the atlantodental junction is a due to rotation.  Carotid atherosclerosis incidentally noted. Cranial settling is present with the tip of the odontoid projecting superior to the clivus.  IMPRESSION: No acute abnormality.  No cervical spine fracture or dislocation. Cervical spondylosis and facet arthrosis with ankylosis at C4-C5. Cranial settling.   Original Report Authenticated By: Andreas Newport, M.D.     Dg Chest Port 1 View  07/24/2012  *RADIOLOGY REPORT*  Clinical Data: Fall.  Weakness.  PORTABLE CHEST - 1 VIEW  Comparison: Two-view chest 04/14/2011.  Findings: Pacing wires are stable.  Heart size is normal.  Mild emphysematous changes are stable.  No focal airspace disease is evident.  Biapical parenchymal scarring is unchanged.  Previously suggested nodular density at the left apex is not detected.  Advanced degenerative changes are noted at the humeral heads bilaterally.  IMPRESSION:  1.  No acute cardiopulmonary disease. 2.  Stable mild emphysema and apical scarring. 3.  Postoperative changes of the right axilla. 4.  Advanced degenerative changes within the shoulders bilaterally.   Original Report Authenticated By: Marin Roberts, M.D.    Ct Maxillofacial Wo Cm  07/24/2012  *RADIOLOGY REPORT*  Clinical Data:  Fall.  Syncope.  Trauma.  Abrasion to the left cheek and left globe.  Laceration to the lip.  CT HEAD WITHOUT CONTRAST CT MAXILLOFACIAL WITHOUT CONTRAST CT CERVICAL SPINE WITHOUT CONTRAST  Technique:  Multidetector CT imaging of the head, cervical spine, and maxillofacial structures were performed using the standard protocol without intravenous contrast. Multiplanar CT image reconstructions of the cervical spine and maxillofacial structures were also generated.  Comparison:  07/23/2011.  CT HEAD  Findings:  Study is mildly degraded by streak artifact. No mass lesion, mass effect, midline shift, hydrocephalus, hemorrhage.  No territorial ischemia or acute infarction.  IMPRESSION: Negative CT head.  CT MAXILLOFACIAL  Findings:  Dural calcification is present in the left middle cranial fossa.  Soft tissue stranding is present over the left cheek.  The globes and orbits appear within normal limits.  The mandibular condyles are located with bilateral temporomandibular joint degenerative disease and flattening of the condyles.  The both pterygoid plates are intact.  Mastoid air cells appear within  normal limits.  Skull base appears normal.  Nasal bones are normal. Orbital floors and medial orbital walls are intact.  Intracranial contents appear within normal limits aside from atherosclerosis.  IMPRESSION: Soft tissue contusion to the left side of the face, predominately affecting the sheath.  No facial fracture.  Severe temporomandibular joint osteoarthritis.  CT CERVICAL SPINE  Findings:   The alignment cervical spine is unchanged compared to 07/23/2011.  2 mm anterolisthesis of C3 on C4. Between 1 mm and 2 mm  anterolisthesis of C4 on C5.  Spondylolisthesis is degenerative, associated with chronic degenerative changes.  There is ankylosis of the C4-C5 facet joints bilaterally, with functional fusion.  The craniocervical junction is within normal limits.  Odontoid appears normal.  Multilevel degenerative disc disease in the mid to lower cervical spine.  C3-C4 predominant facet arthrosis.  Central canal appears patent.  No central canal hematoma is identified. Bilateral pleural apical scarring.  Asymmetry at the atlantodental junction is a due to rotation.  Carotid atherosclerosis incidentally noted. Cranial settling is present with the tip of the odontoid projecting superior to the clivus.  IMPRESSION: No acute abnormality.  No cervical spine fracture or dislocation. Cervical spondylosis and facet arthrosis with ankylosis at C4-C5. Cranial settling.   Original Report Authenticated By: Andreas Newport, M.D.      Assessment/Plan: 79 YO female with known carotid sensitivity and complete heart block S/P pacemaker now presenting with syncopal episode of unknown etiology. On arrival to ED her blood pressure was low (99 systolic). Since admission she has had no further episodes and cardiology is working on increasing her BP. interrogation of pacemaker did show PAF but this was not associated with event. CT head showed no acute infarct. Etiology is unclear, will obtain CTA of head and neck to further evaluate  intracranial vasculature as her low blood pressure may have contributed to a low flow phenomenon and EEG to for possible seizure (although unlikely given description of event)    Recommend:  1) CTA head and neck 2) EEG 3) keep BP normotensive  Assessment and plan discussed with with attending physician and they are in agreement.    Felicie Morn PA-C [facility] [phone]  07/25/2012, 11:29 AM  Patient seen and examined together with physician assistant and I concur with the assessment and plan.  Wyatt Portela, MD

## 2012-07-25 NOTE — Consult Note (Signed)
PCP:   Gaspar Garbe, MD   Chief Complaint:  Consult regarding syncope   HPI: Joy Newman is a delightful 79 year old female well known to me at [facility].  i was asked to consult on her per Dr. Elease Hashimoto, her Cardiologist as she had a syncopal episode yesterday.  She has a pacer after developing complete heart block in Jan 2013.  She states that her last fall was a misstep ion stairs.  Denies LOC at that time.  This time she was out walking and then had an event.  2 passersby stood over her when she came to and called EMS.  She does not note any preceeding symptoms and was feeling her usual self for days prior.    On arrival at the ER, had cranial imaging not showing stroke or facial fracture.  Not any evidence of arrhythmia on arrival, but the pacer interrogation did show PAF overnight, but not associated with the event.  She currently feels well and has no complaints.  She has 3 "adopted" children, with one that I have met before at the bedside.  Review of Systems:  Review of Systems - Negative except a bit sore on the L side of her face. Past Medical History: Past Medical History  Diagnosis Date  . Hyperlipidemia   . Diastolic dysfunction   . CAD (coronary artery disease) no obstruction at cath     Hx of with an anteroapical M.I  . Pulmonary hypertension      Hx of Mild  . Tricuspid regurgitation     Moderate regurgitation  . Kidney stones   . Arteriovenous malformation of colon   . Diverticulosis   . Carotid sinus hypersensitivity   . Syncope   . Hypertension   . PVC and PACs   . Complete heart block-intermittent   . Pacemaker     St Jude  . MVP (mitral valve prolapse)   . Basal cell carcinoma   . Melanoma   . Squamous carcinoma   . Myocardial infarction     2005   Past Surgical History  Procedure Laterality Date  . Appendectomy  1954    Hx of  . Tonsillectomy  1971    Hx of  . Abdominal hysterectomy  1980    Hx of  . Cardiac catheterization      Revealed  an apical wall motion abnormality  . Cardiovascular stress test  02-21-2006    EF 84%, which revealed an apical defect  . Nasal reconstruction  1981  . Skin cancer excision      several, basal cell, squamous, melanoma  . Pacemaker insertion      Medications: Prior to Admission medications   Medication Sig Start Date End Date Taking? Authorizing Provider  Ascorbic Acid (VITAMIN C) 1000 MG tablet Take 1,000 mg by mouth daily.    Yes Historical Provider, MD  aspirin 81 MG tablet Take 81 mg by mouth daily.    Yes Historical Provider, MD  BIOTIN PO Take 600 mcg by mouth 2 (two) times daily.   Yes Historical Provider, MD  Calcium 1500 MG tablet Take 1,500 mg by mouth 2 (two) times daily.    Yes Historical Provider, MD  cholecalciferol (VITAMIN D) 1000 UNITS tablet Take 2,000 Units by mouth daily.    Yes Historical Provider, MD  diltiazem (CARDIZEM LA) 120 MG 24 hr tablet Take 120 mg by mouth daily.   Yes Historical Provider, MD  estrogens, conjugated, (PREMARIN) 0.3 MG tablet Take 0.3 mg  by mouth 2 (two) times a week.    Yes Historical Provider, MD  ibuprofen (ADVIL,MOTRIN) 200 MG tablet Take 200 mg by mouth every 6 (six) hours as needed for pain.   Yes Historical Provider, MD  Multiple Vitamins-Minerals (MULTIVITAMIN PO) Take 1 tablet by mouth daily.    Yes Historical Provider, MD  pantoprazole (PROTONIX) 40 MG tablet Take 40 mg by mouth daily.    Yes Historical Provider, MD  rosuvastatin (CRESTOR) 5 MG tablet Take 5 mg by mouth daily.   Yes Historical Provider, MD  triamterene-hydrochlorothiazide (MAXZIDE-25) 37.5-25 MG per tablet Take 0.5 tablets by mouth daily.    Yes Historical Provider, MD    Allergies:   Allergies  Allergen Reactions  . Imdur (Isosorbide Mononitrate)     Intolerant and cause headaches and nausea  . Polysporin (Bacitracin-Polymyxin B)     Rash  . Quinine Derivatives     Increase in Heart Rate and Decrease in BP   . Tetracyclines & Related     Rash    Social  History:  reports that she has never smoked. She has never used smokeless tobacco. She reports that  drinks alcohol. She reports that she does not use illicit drugs.  Family History: Family History  Problem Relation Age of Onset  . Stroke Mother   . Kidney disease Father   . Lung cancer Brother   . Kidney disease Sister   . Stroke Brother   . Heart disease Brother     x 2  . Liver cancer Sister     Physical Exam: Filed Vitals:   07/24/12 2102 07/24/12 2104 07/24/12 2107 07/25/12 0500  BP: 113/66 109/68 108/65 125/67  Pulse: 79 84 89 78  Temp:    98.2 F (36.8 C)  TempSrc:      Resp:    18  Height: 5\' 1"  (1.549 m)     Weight: 51.256 kg (113 lb)   51.256 kg (113 lb)  SpO2:    94%   General appearance: alert, cooperative and appears stated age Head: Normocephalic, without obvious abnormality, atraumatic Eyes: conjunctivae/corneas clear. PERRL, EOM's intact.  Nose: Nares normal. Septum midline. Mucosa normal. No drainage or sinus tenderness. Throat: lips, mucosa, and tongue normal; teeth and gums normal Neck: no adenopathy, no carotid bruit, no JVD and thyroid not enlarged, symmetric, no tenderness/mass/nodules Resp: clear to auscultation bilaterally Cardio: regular rate and rhythm, S1, S2 normal, grade 2 early systolic murmur, no, click, rub or gallop GI: soft, non-tender; bowel sounds normal; no masses,  no organomegaly Extremities: extremities normal exc, no cyanosis or edema Pulses: 2+ and symmetric Lymph nodes: Cervical adenopathy: no cervical lymphadenopathy Neurologic: Alert and oriented X 3, normal strength and tone. Normal symmetric reflexes.     Labs on Admission:   Recent Labs  07/24/12 1436 07/24/12 2026 07/25/12 0206  NA 140  --  142  K 3.7  --  3.6  CL 100  --  100  CO2 30  --  33*  GLUCOSE 95  --  96  BUN 21  --  16  CREATININE 0.84 0.89 0.93  CALCIUM 9.5  --  9.4    Recent Labs  07/24/12 1436 07/25/12 0206  AST 29 24  ALT 15 13  ALKPHOS  73 65  BILITOT 0.2* 0.3  PROT 6.6 6.1  ALBUMIN 3.8 3.5    Recent Labs  07/24/12 1436 07/24/12 2026  WBC 13.9* 10.2  NEUTROABS 10.4*  --   HGB 12.8 12.6  HCT 37.1 36.4  MCV 97.1 96.3  PLT 230 219    Recent Labs  07/24/12 1436 07/24/12 2025 07/25/12 0206  TROPONINI <0.30 <0.30 <0.30   Lab Results  Component Value Date   INR 0.91 04/11/2011    Recent Labs  07/24/12 2026  TSH 2.240    Radiological Exams on Admission: Ct Head Wo Contrast  07/24/2012  *RADIOLOGY REPORT*  Clinical Data:  Fall.  Syncope.  Trauma.  Abrasion to the left cheek and left globe.  Laceration to the lip.  CT HEAD WITHOUT CONTRAST CT MAXILLOFACIAL WITHOUT CONTRAST CT CERVICAL SPINE WITHOUT CONTRAST  Technique:  Multidetector CT imaging of the head, cervical spine, and maxillofacial structures were performed using the standard protocol without intravenous contrast. Multiplanar CT image reconstructions of the cervical spine and maxillofacial structures were also generated.  Comparison:  07/23/2011.  CT HEAD  Findings:  Study is mildly degraded by streak artifact. No mass lesion, mass effect, midline shift, hydrocephalus, hemorrhage.  No territorial ischemia or acute infarction.  IMPRESSION: Negative CT head.  CT MAXILLOFACIAL  Findings:  Dural calcification is present in the left middle cranial fossa.  Soft tissue stranding is present over the left cheek.  The globes and orbits appear within normal limits.  The mandibular condyles are located with bilateral temporomandibular joint degenerative disease and flattening of the condyles.  The both pterygoid plates are intact.  Mastoid air cells appear within normal limits.  Skull base appears normal.  Nasal bones are normal. Orbital floors and medial orbital walls are intact.  Intracranial contents appear within normal limits aside from atherosclerosis.  IMPRESSION: Soft tissue contusion to the left side of the face, predominately affecting the sheath.  No facial  fracture.  Severe temporomandibular joint osteoarthritis.  CT CERVICAL SPINE  Findings:   The alignment cervical spine is unchanged compared to 07/23/2011.  2 mm anterolisthesis of C3 on C4. Between 1 mm and 2 mm anterolisthesis of C4 on C5.  Spondylolisthesis is degenerative, associated with chronic degenerative changes.  There is ankylosis of the C4-C5 facet joints bilaterally, with functional fusion.  The craniocervical junction is within normal limits.  Odontoid appears normal.  Multilevel degenerative disc disease in the mid to lower cervical spine.  C3-C4 predominant facet arthrosis.  Central canal appears patent.  No central canal hematoma is identified. Bilateral pleural apical scarring.  Asymmetry at the atlantodental junction is a due to rotation.  Carotid atherosclerosis incidentally noted. Cranial settling is present with the tip of the odontoid projecting superior to the clivus.  IMPRESSION: No acute abnormality.  No cervical spine fracture or dislocation. Cervical spondylosis and facet arthrosis with ankylosis at C4-C5. Cranial settling.   Original Report Authenticated By: Andreas Newport, M.D.    Ct Cervical Spine Wo Contrast  07/24/2012  *RADIOLOGY REPORT*  Clinical Data:  Fall.  Syncope.  Trauma.  Abrasion to the left cheek and left globe.  Laceration to the lip.  CT HEAD WITHOUT CONTRAST CT MAXILLOFACIAL WITHOUT CONTRAST CT CERVICAL SPINE WITHOUT CONTRAST  Technique:  Multidetector CT imaging of the head, cervical spine, and maxillofacial structures were performed using the standard protocol without intravenous contrast. Multiplanar CT image reconstructions of the cervical spine and maxillofacial structures were also generated.  Comparison:  07/23/2011.  CT HEAD  Findings:  Study is mildly degraded by streak artifact. No mass lesion, mass effect, midline shift, hydrocephalus, hemorrhage.  No territorial ischemia or acute infarction.  IMPRESSION: Negative CT head.  CT MAXILLOFACIAL  Findings:   Dural  calcification is present in the left middle cranial fossa.  Soft tissue stranding is present over the left cheek.  The globes and orbits appear within normal limits.  The mandibular condyles are located with bilateral temporomandibular joint degenerative disease and flattening of the condyles.  The both pterygoid plates are intact.  Mastoid air cells appear within normal limits.  Skull base appears normal.  Nasal bones are normal. Orbital floors and medial orbital walls are intact.  Intracranial contents appear within normal limits aside from atherosclerosis.  IMPRESSION: Soft tissue contusion to the left side of the face, predominately affecting the sheath.  No facial fracture.  Severe temporomandibular joint osteoarthritis.  CT CERVICAL SPINE  Findings:   The alignment cervical spine is unchanged compared to 07/23/2011.  2 mm anterolisthesis of C3 on C4. Between 1 mm and 2 mm anterolisthesis of C4 on C5.  Spondylolisthesis is degenerative, associated with chronic degenerative changes.  There is ankylosis of the C4-C5 facet joints bilaterally, with functional fusion.  The craniocervical junction is within normal limits.  Odontoid appears normal.  Multilevel degenerative disc disease in the mid to lower cervical spine.  C3-C4 predominant facet arthrosis.  Central canal appears patent.  No central canal hematoma is identified. Bilateral pleural apical scarring.  Asymmetry at the atlantodental junction is a due to rotation.  Carotid atherosclerosis incidentally noted. Cranial settling is present with the tip of the odontoid projecting superior to the clivus.  IMPRESSION: No acute abnormality.  No cervical spine fracture or dislocation. Cervical spondylosis and facet arthrosis with ankylosis at C4-C5. Cranial settling.   Original Report Authenticated By: Andreas Newport, M.D.    Dg Chest Port 1 View  07/24/2012  *RADIOLOGY REPORT*  Clinical Data: Fall.  Weakness.  PORTABLE CHEST - 1 VIEW  Comparison: Two-view  chest 04/14/2011.  Findings: Pacing wires are stable.  Heart size is normal.  Mild emphysematous changes are stable.  No focal airspace disease is evident.  Biapical parenchymal scarring is unchanged.  Previously suggested nodular density at the left apex is not detected.  Advanced degenerative changes are noted at the humeral heads bilaterally.  IMPRESSION:  1.  No acute cardiopulmonary disease. 2.  Stable mild emphysema and apical scarring. 3.  Postoperative changes of the right axilla. 4.  Advanced degenerative changes within the shoulders bilaterally.   Original Report Authenticated By: Marin Roberts, M.D.    Ct Maxillofacial Wo Cm  07/24/2012  *RADIOLOGY REPORT*  Clinical Data:  Fall.  Syncope.  Trauma.  Abrasion to the left cheek and left globe.  Laceration to the lip.  CT HEAD WITHOUT CONTRAST CT MAXILLOFACIAL WITHOUT CONTRAST CT CERVICAL SPINE WITHOUT CONTRAST  Technique:  Multidetector CT imaging of the head, cervical spine, and maxillofacial structures were performed using the standard protocol without intravenous contrast. Multiplanar CT image reconstructions of the cervical spine and maxillofacial structures were also generated.  Comparison:  07/23/2011.  CT HEAD  Findings:  Study is mildly degraded by streak artifact. No mass lesion, mass effect, midline shift, hydrocephalus, hemorrhage.  No territorial ischemia or acute infarction.  IMPRESSION: Negative CT head.  CT MAXILLOFACIAL  Findings:  Dural calcification is present in the left middle cranial fossa.  Soft tissue stranding is present over the left cheek.  The globes and orbits appear within normal limits.  The mandibular condyles are located with bilateral temporomandibular joint degenerative disease and flattening of the condyles.  The both pterygoid plates are intact.  Mastoid air cells appear within normal limits.  Skull base  appears normal.  Nasal bones are normal. Orbital floors and medial orbital walls are intact.  Intracranial  contents appear within normal limits aside from atherosclerosis.  IMPRESSION: Soft tissue contusion to the left side of the face, predominately affecting the sheath.  No facial fracture.  Severe temporomandibular joint osteoarthritis.  CT CERVICAL SPINE  Findings:   The alignment cervical spine is unchanged compared to 07/23/2011.  2 mm anterolisthesis of C3 on C4. Between 1 mm and 2 mm anterolisthesis of C4 on C5.  Spondylolisthesis is degenerative, associated with chronic degenerative changes.  There is ankylosis of the C4-C5 facet joints bilaterally, with functional fusion.  The craniocervical junction is within normal limits.  Odontoid appears normal.  Multilevel degenerative disc disease in the mid to lower cervical spine.  C3-C4 predominant facet arthrosis.  Central canal appears patent.  No central canal hematoma is identified. Bilateral pleural apical scarring.  Asymmetry at the atlantodental junction is a due to rotation.  Carotid atherosclerosis incidentally noted. Cranial settling is present with the tip of the odontoid projecting superior to the clivus.  IMPRESSION: No acute abnormality.  No cervical spine fracture or dislocation. Cervical spondylosis and facet arthrosis with ankylosis at C4-C5. Cranial settling.   Original Report Authenticated By: Andreas Newport, M.D.    Orders placed during the hospital encounter of 07/24/12  . EKG 12-LEAD  . EKG 12-LEAD  . EKG 12-LEAD    Assessment/Plan Active Problems:   Syncope- Given her lack of preceeding symptoms, this makes me concerned about either extreme sudden change in BP, from an endocrine standpoint, or some sort of seizure activity.  As we cannot do MRI because of the pacer, will order carotid dopplers as well as EEG to eval this.  AM cortisol will also be drawn (TSH was normal).  I have spoken with Dr. Elease Hashimoto and we are going to eliminate her Maxzide and I will order orthostatics throughout the day.  If she has any drops, will consider adding  Florinef at low dose.  Pacer was interrogated and we don't believe it to be arrhythmia related, regardless of the afib noted as she did not have it sustained or during the event.  Continue Protonix for GI issues (mild GERD)  Continue Statin, her checks have always been at goal.  Appreciate being asked to consult and will continue to see her throughout her hospital stay.  Skyeler Scalese W 07/25/2012, 9:09 AM

## 2012-07-25 NOTE — Progress Notes (Signed)
  Echocardiogram 2D Echocardiogram has been performed.  Aldene Hendon FRANCES 07/25/2012, 12:08 PM

## 2012-07-25 NOTE — Progress Notes (Signed)
EEG completed.

## 2012-07-25 NOTE — Progress Notes (Signed)
UR Completed Husna Krone Graves-Bigelow, RN,BSN [phone]  

## 2012-07-26 ENCOUNTER — Telehealth: Payer: Self-pay | Admitting: Cardiovascular Disease

## 2012-07-26 ENCOUNTER — Encounter (HOSPITAL_COMMUNITY): Payer: Self-pay | Admitting: Internal Medicine

## 2012-07-26 DIAGNOSIS — E785 Hyperlipidemia, unspecified: Secondary | ICD-10-CM | POA: Diagnosis not present

## 2012-07-26 DIAGNOSIS — R55 Syncope and collapse: Secondary | ICD-10-CM | POA: Diagnosis not present

## 2012-07-26 DIAGNOSIS — I10 Essential (primary) hypertension: Secondary | ICD-10-CM | POA: Diagnosis not present

## 2012-07-26 DIAGNOSIS — I4891 Unspecified atrial fibrillation: Secondary | ICD-10-CM | POA: Diagnosis not present

## 2012-07-26 LAB — CORTISOL-AM, BLOOD: Cortisol - AM: 12.6 ug/dL (ref 4.3–22.4)

## 2012-07-26 MED ORDER — FLUDROCORTISONE ACETATE 0.1 MG PO TABS: 0.1000 mg | ORAL_TABLET | Freq: Every day | ORAL | Status: DC

## 2012-07-26 MED ORDER — FLUDROCORTISONE ACETATE 0.1 MG PO TABS
0.1000 mg | ORAL_TABLET | Freq: Every day | ORAL | Status: DC
  Administered 2012-07-26: 0.1 mg via ORAL
  Filled 2012-07-26: qty 1

## 2012-07-26 NOTE — Telephone Encounter (Signed)
07/29/2012 Message Room Staff:  Per Joy Newman, please call pt to check on her.  D/c'd from the hosp on 07/26/2012.

## 2012-07-26 NOTE — Progress Notes (Signed)
Patient ambulated in hallway 500 feet.  Tolerated ambulation well.  Will continue to monitor. [name], [name]

## 2012-07-26 NOTE — Progress Notes (Signed)
Subjective: Uneventful day yesterday.  Reviewed most of her testing this AM, as I am the first to see her.  Noted that she has considerable orthostasis and suspect that this is a good portion of her issues with the syncopal event.  Objective: Vital signs in last 24 hours: Temp:  [97.5 F (36.4 C)-98.1 F (36.7 C)] 97.5 F (36.4 C) (04/25 0600) Pulse Rate:  [67-84] 82 (04/25 0607) Resp:  [17-18] 18 (04/25 0600) BP: (102-142)/(58-77) 109/74 mmHg (04/25 0607) SpO2:  [94 %-96 %] 95 % (04/25 0607) Weight:  [51.529 kg (113 lb 9.6 oz)] 51.529 kg (113 lb 9.6 oz) (04/25 0600) Weight change: 0.272 kg (9.6 oz) Last BM Date: 07/25/12  Intake/Output from previous day: 04/24 0701 - 04/25 0700 In: 1080 [P.O.:1080] Out: -  Intake/Output this shift:   General appearance: alert, cooperative and appears stated age  Head: Normocephalic, without obvious abnormality, traumatic to L face with bruising starting to yellow.  Neck: no adenopathy, no carotid bruit, no JVD and thyroid not enlarged, symmetric, no tenderness/mass/nodules  Resp: clear to auscultation bilaterally  Cardio: regular rate and rhythm, S1, S2 normal, grade 2 early systolic murmur, no, click, rub or gallop  GI: soft, non-tender; bowel sounds normal; no masses, no organomegaly  Extremities: extremities normal exc, no cyanosis or edema  Pulses: 2+ and symmetric  Neurologic: Alert and oriented X 3, normal strength and tone. Normal symmetric reflexes.    Lab Results:  Recent Labs  07/24/12 1436 07/24/12 2026  WBC 13.9* 10.2  HGB 12.8 12.6  HCT 37.1 36.4  PLT 230 219   BMET  Recent Labs  07/24/12 1436 07/24/12 2026 07/25/12 0206  NA 140  --  142  K 3.7  --  3.6  CL 100  --  100  CO2 30  --  33*  GLUCOSE 95  --  96  BUN 21  --  16  CREATININE 0.84 0.89 0.93  CALCIUM 9.5  --  9.4    Studies/Results: Ct Angio Head W/cm &/or Wo Cm  07/25/2012  *RADIOLOGY REPORT*  Clinical Data:  79 year old female with syncope, fall.   Left face bruising.  CT ANGIOGRAPHY HEAD AND NECK  Technique:  Multidetector CT imaging of the head and neck was performed using the standard protocol during bolus administration of intravenous contrast.  Multiplanar CT image reconstructions including MIPs were obtained to evaluate the vascular anatomy. Carotid stenosis measurements (when applicable) are obtained utilizing NASCET criteria, using the distal internal carotid diameter as the denominator.  Contrast: 50mL OMNIPAQUE IOHEXOL 350 MG/ML SOLN  Comparison:  Head and cervical spine CTs 07/24/2012 and earlier.  CTA NECK  Findings:  Left chest cardiac pacemaker visible on the scout view. Mild to moderate scarring in the lung apices.  No superior mediastinal lymphadenopathy. The stable thyroid.  Negative larynx, pharynx, parapharyngeal spaces, retropharyngeal space, sublingual space, and right submandibular and parotid glands.  There is mild inflammation at the left lower face and submandibular space including thickening of the platysma.  No definite involvement of the left submandibular gland.  This probably is post traumatic. Visualized orbit soft tissues are within normal limits.  Visualized paranasal sinuses and mastoids are clear.  Degenerative changes in the cervical spine, severe at the left C1-C2 level.  Vascular Findings: Bovine arch configuration.  Mild arch and great vessel origins soft plaque.  No associated stenosis.  No right CCA origin stenosis.  Negative right CCA proximal to the bifurcation.  Soft and calcified plaque at the right  carotid bifurcation but no right ICA origin or proximal segment stenosis. Beginning about 2.5 cm beyond the right ICA origin there is the cervical ICA irregularity with a beaded appearance compatible with fibromuscular dysplasia.  This continues to just below the skull base.  No proximal right subclavian artery stenosis.  Normal right vertebral artery origin.  Negative cervical right vertebral artery.  Negative left CCA  aside from minor soft plaque.  Normal for age left carotid bifurcation.  Mild tortuosity the cervical left ICA. On the left, irregularity of the vessel in keeping with FMD begins at the C2-C3 level and continues to just below the skull base.  No proximal left subclavian artery stenosis.  Normal left vertebral artery origin.  Mild tortuosity the proximal left vertebral artery and intermittently in the cervical spine.  Otherwise negative cervical left vertebral artery.   Review of the MIP images confirms the above findings.  IMPRESSION: 1.  Negative cervical vertebral arteries.  Intracranial findings are below. 2. Right greater than left cervical ICA fibromuscular dysplasia. Right greater than left carotid bifurcation atherosclerosis.  But no hemodynamically significant carotid stenosis in the neck. 3. Mild left face and submandibular space inflammation likely is post traumatic (contusion).  CTA HEAD  Findings:  Negative scalp hematoma.  Calvarium intact.  Stable and normal for age noncontrast CT appearance of the brain.  Following contrast, No abnormal enhancement identified.  Vascular Findings: Major intracranial venous structures are enhancing.  No irregularity of the ICA siphons to suggest intracranial involvement by fibromuscular dysplasia.  Minimal siphon atherosclerosis and no stenosis.  Bilateral ophthalmic and posterior communicating artery origins are within normal limits. Normal carotid termini.  Normal ACA origins.  Dominant left ACA A1 segment.  Anterior communicating artery within normal limits.  Bilateral ACA branches are within normal limits.  Normal MCA origins.  Bilateral MCA branches are within normal limits.  Codominant distal vertebral arteries are normal at the skull base. Normal left PICA.  Patent vertebrobasilar junction.  No distal vertebral artery stenosis.  Mild basilar tortuosity with no stenosis.  Dominant right AICA.  SCA and PCA origins are normal. Posterior communicating arteries are  diminutive.  Bilateral PCA branches are within normal limits.   Review of the MIP images confirms the above findings.  IMPRESSION: 1.  Negative intracranial CTA. 2.  Normal for age CT appearance of the brain.   Original Report Authenticated By: Erskine Speed, M.D.    Ct Head Wo Contrast  07/24/2012  *RADIOLOGY REPORT*  Clinical Data:  Fall.  Syncope.  Trauma.  Abrasion to the left cheek and left globe.  Laceration to the lip.  CT HEAD WITHOUT CONTRAST CT MAXILLOFACIAL WITHOUT CONTRAST CT CERVICAL SPINE WITHOUT CONTRAST  Technique:  Multidetector CT imaging of the head, cervical spine, and maxillofacial structures were performed using the standard protocol without intravenous contrast. Multiplanar CT image reconstructions of the cervical spine and maxillofacial structures were also generated.  Comparison:  07/23/2011.  CT HEAD  Findings:  Study is mildly degraded by streak artifact. No mass lesion, mass effect, midline shift, hydrocephalus, hemorrhage.  No territorial ischemia or acute infarction.  IMPRESSION: Negative CT head.  CT MAXILLOFACIAL  Findings:  Dural calcification is present in the left middle cranial fossa.  Soft tissue stranding is present over the left cheek.  The globes and orbits appear within normal limits.  The mandibular condyles are located with bilateral temporomandibular joint degenerative disease and flattening of the condyles.  The both pterygoid plates are intact.  Mastoid air  cells appear within normal limits.  Skull base appears normal.  Nasal bones are normal. Orbital floors and medial orbital walls are intact.  Intracranial contents appear within normal limits aside from atherosclerosis.  IMPRESSION: Soft tissue contusion to the left side of the face, predominately affecting the sheath.  No facial fracture.  Severe temporomandibular joint osteoarthritis.  CT CERVICAL SPINE  Findings:   The alignment cervical spine is unchanged compared to 07/23/2011.  2 mm anterolisthesis of C3 on C4.  Between 1 mm and 2 mm anterolisthesis of C4 on C5.  Spondylolisthesis is degenerative, associated with chronic degenerative changes.  There is ankylosis of the C4-C5 facet joints bilaterally, with functional fusion.  The craniocervical junction is within normal limits.  Odontoid appears normal.  Multilevel degenerative disc disease in the mid to lower cervical spine.  C3-C4 predominant facet arthrosis.  Central canal appears patent.  No central canal hematoma is identified. Bilateral pleural apical scarring.  Asymmetry at the atlantodental junction is a due to rotation.  Carotid atherosclerosis incidentally noted. Cranial settling is present with the tip of the odontoid projecting superior to the clivus.  IMPRESSION: No acute abnormality.  No cervical spine fracture or dislocation. Cervical spondylosis and facet arthrosis with ankylosis at C4-C5. Cranial settling.   Original Report Authenticated By: Andreas Newport, M.D.    Ct Angio Neck W/cm &/or Wo/cm  07/25/2012  *RADIOLOGY REPORT*  Clinical Data:  79 year old female with syncope, fall.  Left face bruising.  CT ANGIOGRAPHY HEAD AND NECK  Technique:  Multidetector CT imaging of the head and neck was performed using the standard protocol during bolus administration of intravenous contrast.  Multiplanar CT image reconstructions including MIPs were obtained to evaluate the vascular anatomy. Carotid stenosis measurements (when applicable) are obtained utilizing NASCET criteria, using the distal internal carotid diameter as the denominator.  Contrast: 50mL OMNIPAQUE IOHEXOL 350 MG/ML SOLN  Comparison:  Head and cervical spine CTs 07/24/2012 and earlier.  CTA NECK  Findings:  Left chest cardiac pacemaker visible on the scout view. Mild to moderate scarring in the lung apices.  No superior mediastinal lymphadenopathy. The stable thyroid.  Negative larynx, pharynx, parapharyngeal spaces, retropharyngeal space, sublingual space, and right submandibular and parotid  glands.  There is mild inflammation at the left lower face and submandibular space including thickening of the platysma.  No definite involvement of the left submandibular gland.  This probably is post traumatic. Visualized orbit soft tissues are within normal limits.  Visualized paranasal sinuses and mastoids are clear.  Degenerative changes in the cervical spine, severe at the left C1-C2 level.  Vascular Findings: Bovine arch configuration.  Mild arch and great vessel origins soft plaque.  No associated stenosis.  No right CCA origin stenosis.  Negative right CCA proximal to the bifurcation.  Soft and calcified plaque at the right carotid bifurcation but no right ICA origin or proximal segment stenosis. Beginning about 2.5 cm beyond the right ICA origin there is the cervical ICA irregularity with a beaded appearance compatible with fibromuscular dysplasia.  This continues to just below the skull base.  No proximal right subclavian artery stenosis.  Normal right vertebral artery origin.  Negative cervical right vertebral artery.  Negative left CCA aside from minor soft plaque.  Normal for age left carotid bifurcation.  Mild tortuosity the cervical left ICA. On the left, irregularity of the vessel in keeping with FMD begins at the C2-C3 level and continues to just below the skull base.  No proximal left subclavian artery stenosis.  Normal left vertebral artery origin.  Mild tortuosity the proximal left vertebral artery and intermittently in the cervical spine.  Otherwise negative cervical left vertebral artery.   Review of the MIP images confirms the above findings.  IMPRESSION: 1.  Negative cervical vertebral arteries.  Intracranial findings are below. 2. Right greater than left cervical ICA fibromuscular dysplasia. Right greater than left carotid bifurcation atherosclerosis.  But no hemodynamically significant carotid stenosis in the neck. 3. Mild left face and submandibular space inflammation likely is post  traumatic (contusion).  CTA HEAD  Findings:  Negative scalp hematoma.  Calvarium intact.  Stable and normal for age noncontrast CT appearance of the brain.  Following contrast, No abnormal enhancement identified.  Vascular Findings: Major intracranial venous structures are enhancing.  No irregularity of the ICA siphons to suggest intracranial involvement by fibromuscular dysplasia.  Minimal siphon atherosclerosis and no stenosis.  Bilateral ophthalmic and posterior communicating artery origins are within normal limits. Normal carotid termini.  Normal ACA origins.  Dominant left ACA A1 segment.  Anterior communicating artery within normal limits.  Bilateral ACA branches are within normal limits.  Normal MCA origins.  Bilateral MCA branches are within normal limits.  Codominant distal vertebral arteries are normal at the skull base. Normal left PICA.  Patent vertebrobasilar junction.  No distal vertebral artery stenosis.  Mild basilar tortuosity with no stenosis.  Dominant right AICA.  SCA and PCA origins are normal. Posterior communicating arteries are diminutive.  Bilateral PCA branches are within normal limits.   Review of the MIP images confirms the above findings.  IMPRESSION: 1.  Negative intracranial CTA. 2.  Normal for age CT appearance of the brain.   Original Report Authenticated By: Erskine Speed, M.D.    Ct Cervical Spine Wo Contrast  07/24/2012  *RADIOLOGY REPORT*  Clinical Data:  Fall.  Syncope.  Trauma.  Abrasion to the left cheek and left globe.  Laceration to the lip.  CT HEAD WITHOUT CONTRAST CT MAXILLOFACIAL WITHOUT CONTRAST CT CERVICAL SPINE WITHOUT CONTRAST  Technique:  Multidetector CT imaging of the head, cervical spine, and maxillofacial structures were performed using the standard protocol without intravenous contrast. Multiplanar CT image reconstructions of the cervical spine and maxillofacial structures were also generated.  Comparison:  07/23/2011.  CT HEAD  Findings:  Study is mildly  degraded by streak artifact. No mass lesion, mass effect, midline shift, hydrocephalus, hemorrhage.  No territorial ischemia or acute infarction.  IMPRESSION: Negative CT head.  CT MAXILLOFACIAL  Findings:  Dural calcification is present in the left middle cranial fossa.  Soft tissue stranding is present over the left cheek.  The globes and orbits appear within normal limits.  The mandibular condyles are located with bilateral temporomandibular joint degenerative disease and flattening of the condyles.  The both pterygoid plates are intact.  Mastoid air cells appear within normal limits.  Skull base appears normal.  Nasal bones are normal. Orbital floors and medial orbital walls are intact.  Intracranial contents appear within normal limits aside from atherosclerosis.  IMPRESSION: Soft tissue contusion to the left side of the face, predominately affecting the sheath.  No facial fracture.  Severe temporomandibular joint osteoarthritis.  CT CERVICAL SPINE  Findings:   The alignment cervical spine is unchanged compared to 07/23/2011.  2 mm anterolisthesis of C3 on C4. Between 1 mm and 2 mm anterolisthesis of C4 on C5.  Spondylolisthesis is degenerative, associated with chronic degenerative changes.  There is ankylosis of the C4-C5 facet joints bilaterally, with functional fusion.  The craniocervical junction is within normal limits.  Odontoid appears normal.  Multilevel degenerative disc disease in the mid to lower cervical spine.  C3-C4 predominant facet arthrosis.  Central canal appears patent.  No central canal hematoma is identified. Bilateral pleural apical scarring.  Asymmetry at the atlantodental junction is a due to rotation.  Carotid atherosclerosis incidentally noted. Cranial settling is present with the tip of the odontoid projecting superior to the clivus.  IMPRESSION: No acute abnormality.  No cervical spine fracture or dislocation. Cervical spondylosis and facet arthrosis with ankylosis at C4-C5. Cranial  settling.   Original Report Authenticated By: Andreas Newport, M.D.    Dg Chest Port 1 View  07/24/2012  *RADIOLOGY REPORT*  Clinical Data: Fall.  Weakness.  PORTABLE CHEST - 1 VIEW  Comparison: Two-view chest 04/14/2011.  Findings: Pacing wires are stable.  Heart size is normal.  Mild emphysematous changes are stable.  No focal airspace disease is evident.  Biapical parenchymal scarring is unchanged.  Previously suggested nodular density at the left apex is not detected.  Advanced degenerative changes are noted at the humeral heads bilaterally.  IMPRESSION:  1.  No acute cardiopulmonary disease. 2.  Stable mild emphysema and apical scarring. 3.  Postoperative changes of the right axilla. 4.  Advanced degenerative changes within the shoulders bilaterally.   Original Report Authenticated By: Marin Roberts, M.D.    Ct Maxillofacial Wo Cm  07/24/2012  *RADIOLOGY REPORT*  Clinical Data:  Fall.  Syncope.  Trauma.  Abrasion to the left cheek and left globe.  Laceration to the lip.  CT HEAD WITHOUT CONTRAST CT MAXILLOFACIAL WITHOUT CONTRAST CT CERVICAL SPINE WITHOUT CONTRAST  Technique:  Multidetector CT imaging of the head, cervical spine, and maxillofacial structures were performed using the standard protocol without intravenous contrast. Multiplanar CT image reconstructions of the cervical spine and maxillofacial structures were also generated.  Comparison:  07/23/2011.  CT HEAD  Findings:  Study is mildly degraded by streak artifact. No mass lesion, mass effect, midline shift, hydrocephalus, hemorrhage.  No territorial ischemia or acute infarction.  IMPRESSION: Negative CT head.  CT MAXILLOFACIAL  Findings:  Dural calcification is present in the left middle cranial fossa.  Soft tissue stranding is present over the left cheek.  The globes and orbits appear within normal limits.  The mandibular condyles are located with bilateral temporomandibular joint degenerative disease and flattening of the condyles.  The  both pterygoid plates are intact.  Mastoid air cells appear within normal limits.  Skull base appears normal.  Nasal bones are normal. Orbital floors and medial orbital walls are intact.  Intracranial contents appear within normal limits aside from atherosclerosis.  IMPRESSION: Soft tissue contusion to the left side of the face, predominately affecting the sheath.  No facial fracture.  Severe temporomandibular joint osteoarthritis.  CT CERVICAL SPINE  Findings:   The alignment cervical spine is unchanged compared to 07/23/2011.  2 mm anterolisthesis of C3 on C4. Between 1 mm and 2 mm anterolisthesis of C4 on C5.  Spondylolisthesis is degenerative, associated with chronic degenerative changes.  There is ankylosis of the C4-C5 facet joints bilaterally, with functional fusion.  The craniocervical junction is within normal limits.  Odontoid appears normal.  Multilevel degenerative disc disease in the mid to lower cervical spine.  C3-C4 predominant facet arthrosis.  Central canal appears patent.  No central canal hematoma is identified. Bilateral pleural apical scarring.  Asymmetry at the atlantodental junction is a due to rotation.  Carotid atherosclerosis incidentally noted. Cranial settling  is present with the tip of the odontoid projecting superior to the clivus.  IMPRESSION: No acute abnormality.  No cervical spine fracture or dislocation. Cervical spondylosis and facet arthrosis with ankylosis at C4-C5. Cranial settling.   Original Report Authenticated By: Andreas Newport, M.D.     Medications:  I have reviewed the patient's current medications. Scheduled: . aspirin  81 mg Oral Daily  . atorvastatin  10 mg Oral Daily  . calcium carbonate  1 tablet Oral BID WC  . cholecalciferol  2,000 Units Oral Daily  . diltiazem  120 mg Oral Daily  . enoxaparin (LOVENOX) injection  30 mg Subcutaneous Q24H  . estrogens (conjugated)  0.3 mg Oral 2 times weekly  . fludrocortisone  0.1 mg Oral Daily  . multivitamin with  minerals  1 tablet Oral Daily  . pantoprazole  40 mg Oral Daily  . sodium chloride  3 mL Intravenous Q12H  . vitamin C  1,000 mg Oral Daily   Continuous:  ZOX:WRUEAV chloride, acetaminophen, ALPRAZolam, nitroGLYCERIN, ondansetron (ZOFRAN) IV, sodium chloride, zolpidem  Assessment/Plan: Syncope- Given her lack of preceeding symptoms, this makes me concerned about either extreme sudden change in BP,This seems confirmed by her level of orthostasis.  The Cortisol result is not yet back, but regardless, will start her on Florinef 0.1mg  daily.  If the cortisol is abnormal, I will undertake further workup as outpatient, as many of these tests take a considerable amount of time to come back regardless, and we cannot image her pituitary with MRI due to the pacer. Echo, CT-A and EEG all seem within reasonable limits. Continue Protonix for GI issues (mild GERD)  Continue Statin, her checks have always been at goal.   Likely could go home today with Maxzide d/ced and starting Florinef.  I will have my nurse call her to check on her Monday and arrange outpatient followup.     LOS: 2 days   Joy Newman W 07/26/2012, 7:59 AM

## 2012-07-26 NOTE — Telephone Encounter (Signed)
New Problem:    Called in wanting the patient to be called on 07/29/12 to check on her.  Please call back.

## 2012-07-26 NOTE — Progress Notes (Signed)
DC orders received.  Patient stable with no S/S of distress.  Discharge & medication and instructions reviewed with patient and patient's family.  Patient DC home. [place], [name]

## 2012-07-26 NOTE — Progress Notes (Signed)
NEURO HOSPITALIST PROGRESS NOTE   SUBJECTIVE:                                                                                                                         No further events. Patient feels well.  She is in good spirits and desires to go home later today OBJECTIVE:                                                                                                                           Vital signs in last 24 hours: Temp:  [97.5 F (36.4 C)-98.1 F (36.7 C)] 97.5 F (36.4 C) (04/25 0600) Pulse Rate:  [67-84] 82 (04/25 0607) Resp:  [17-18] 18 (04/25 0600) BP: (102-142)/(58-77) 109/74 mmHg (04/25 0607) SpO2:  [94 %-96 %] 95 % (04/25 0607) Weight:  [51.529 kg (113 lb 9.6 oz)] 51.529 kg (113 lb 9.6 oz) (04/25 0600)  Intake/Output from previous day: 04/24 0701 - 04/25 0700 In: 1080 [P.O.:1080] Out: -  Intake/Output this shift:   Nutritional status: Cardiac  Past Medical History  Diagnosis Date  . Hyperlipidemia   . Diastolic dysfunction   . CAD (coronary artery disease) no obstruction at cath     Hx of with an anteroapical M.I  . Pulmonary hypertension      Hx of Mild  . Tricuspid regurgitation     Moderate regurgitation  . Kidney stones   . Arteriovenous malformation of colon   . Diverticulosis   . Carotid sinus hypersensitivity   . Syncope   . Hypertension   . PVC and PACs   . Complete heart block-intermittent   . Pacemaker     St Jude  . MVP (mitral valve prolapse)   . Basal cell carcinoma   . Melanoma   . Squamous carcinoma   . Myocardial infarction     2005  . Orthostasis      Neurologic Exam:  Mental Status: Alert, oriented, thought content appropriate.  Speech fluent without evidence of aphasia.  Able to follow 3 step commands without difficulty. Cranial Nerves: II: Visual fields grossly normal, pupils equal, round, reactive to light and accommodation III,IV, VI: ptosis not present, extra-ocular motions  intact bilaterally V,VII: smile symmetric, facial light touch  sensation normal bilaterally VIII: hearing normal bilaterally IX,X: gag reflex present XI: bilateral shoulder shrug XII: midline tongue extension Motor: Right : Upper extremity   5/5    Left:     Upper extremity   5/5  Lower extremity   5/5     Lower extremity   5/5 Tone and bulk:normal tone throughout; no atrophy noted Sensory: Pinprick and light touch intact throughout, bilaterally Deep Tendon Reflexes: 2+ and symmetric throughout Plantars: Right: downgoing   Left: downgoing Cerebellar: normal finger-to-nose,  normal heel-to-shin test CV: pulses palpable throughout    Lab Results: Lab Results  Component Value Date/Time   CHOL 210* 11/20/2011  8:59 AM   Lipid Panel No results found for this basename: CHOL, TRIG, HDL, CHOLHDL, VLDL, LDLCALC,  in the last 72 hours  Studies/Results: Ct Angio Head W/cm &/or Wo Cm  07/25/2012  *RADIOLOGY REPORT*  Clinical Data:  79 year old female with syncope, fall.  Left face bruising.  CT ANGIOGRAPHY HEAD AND NECK  Technique:  Multidetector CT imaging of the head and neck was performed using the standard protocol during bolus administration of intravenous contrast.  Multiplanar CT image reconstructions including MIPs were obtained to evaluate the vascular anatomy. Carotid stenosis measurements (when applicable) are obtained utilizing NASCET criteria, using the distal internal carotid diameter as the denominator.  Contrast: 50mL OMNIPAQUE IOHEXOL 350 MG/ML SOLN  Comparison:  Head and cervical spine CTs 07/24/2012 and earlier.  CTA NECK  Findings:  Left chest cardiac pacemaker visible on the scout view. Mild to moderate scarring in the lung apices.  No superior mediastinal lymphadenopathy. The stable thyroid.  Negative larynx, pharynx, parapharyngeal spaces, retropharyngeal space, sublingual space, and right submandibular and parotid glands.  There is mild inflammation at the left lower face and  submandibular space including thickening of the platysma.  No definite involvement of the left submandibular gland.  This probably is post traumatic. Visualized orbit soft tissues are within normal limits.  Visualized paranasal sinuses and mastoids are clear.  Degenerative changes in the cervical spine, severe at the left C1-C2 level.  Vascular Findings: Bovine arch configuration.  Mild arch and great vessel origins soft plaque.  No associated stenosis.  No right CCA origin stenosis.  Negative right CCA proximal to the bifurcation.  Soft and calcified plaque at the right carotid bifurcation but no right ICA origin or proximal segment stenosis. Beginning about 2.5 cm beyond the right ICA origin there is the cervical ICA irregularity with a beaded appearance compatible with fibromuscular dysplasia.  This continues to just below the skull base.  No proximal right subclavian artery stenosis.  Normal right vertebral artery origin.  Negative cervical right vertebral artery.  Negative left CCA aside from minor soft plaque.  Normal for age left carotid bifurcation.  Mild tortuosity the cervical left ICA. On the left, irregularity of the vessel in keeping with FMD begins at the C2-C3 level and continues to just below the skull base.  No proximal left subclavian artery stenosis.  Normal left vertebral artery origin.  Mild tortuosity the proximal left vertebral artery and intermittently in the cervical spine.  Otherwise negative cervical left vertebral artery.   Review of the MIP images confirms the above findings.  IMPRESSION: 1.  Negative cervical vertebral arteries.  Intracranial findings are below. 2. Right greater than left cervical ICA fibromuscular dysplasia. Right greater than left carotid bifurcation atherosclerosis.  But no hemodynamically significant carotid stenosis in the neck. 3. Mild left face and submandibular space inflammation likely is post  traumatic (contusion).  CTA HEAD  Findings:  Negative scalp  hematoma.  Calvarium intact.  Stable and normal for age noncontrast CT appearance of the brain.  Following contrast, No abnormal enhancement identified.  Vascular Findings: Major intracranial venous structures are enhancing.  No irregularity of the ICA siphons to suggest intracranial involvement by fibromuscular dysplasia.  Minimal siphon atherosclerosis and no stenosis.  Bilateral ophthalmic and posterior communicating artery origins are within normal limits. Normal carotid termini.  Normal ACA origins.  Dominant left ACA A1 segment.  Anterior communicating artery within normal limits.  Bilateral ACA branches are within normal limits.  Normal MCA origins.  Bilateral MCA branches are within normal limits.  Codominant distal vertebral arteries are normal at the skull base. Normal left PICA.  Patent vertebrobasilar junction.  No distal vertebral artery stenosis.  Mild basilar tortuosity with no stenosis.  Dominant right AICA.  SCA and PCA origins are normal. Posterior communicating arteries are diminutive.  Bilateral PCA branches are within normal limits.   Review of the MIP images confirms the above findings.  IMPRESSION: 1.  Negative intracranial CTA. 2.  Normal for age CT appearance of the brain.   Original Report Authenticated By: Erskine Speed, M.D.    Ct Head Wo Contrast  07/24/2012  *RADIOLOGY REPORT*  Clinical Data:  Fall.  Syncope.  Trauma.  Abrasion to the left cheek and left globe.  Laceration to the lip.  CT HEAD WITHOUT CONTRAST CT MAXILLOFACIAL WITHOUT CONTRAST CT CERVICAL SPINE WITHOUT CONTRAST  Technique:  Multidetector CT imaging of the head, cervical spine, and maxillofacial structures were performed using the standard protocol without intravenous contrast. Multiplanar CT image reconstructions of the cervical spine and maxillofacial structures were also generated.  Comparison:  07/23/2011.  CT HEAD  Findings:  Study is mildly degraded by streak artifact. No mass lesion, mass effect, midline shift,  hydrocephalus, hemorrhage.  No territorial ischemia or acute infarction.  IMPRESSION: Negative CT head.  CT MAXILLOFACIAL  Findings:  Dural calcification is present in the left middle cranial fossa.  Soft tissue stranding is present over the left cheek.  The globes and orbits appear within normal limits.  The mandibular condyles are located with bilateral temporomandibular joint degenerative disease and flattening of the condyles.  The both pterygoid plates are intact.  Mastoid air cells appear within normal limits.  Skull base appears normal.  Nasal bones are normal. Orbital floors and medial orbital walls are intact.  Intracranial contents appear within normal limits aside from atherosclerosis.  IMPRESSION: Soft tissue contusion to the left side of the face, predominately affecting the sheath.  No facial fracture.  Severe temporomandibular joint osteoarthritis.  CT CERVICAL SPINE  Findings:   The alignment cervical spine is unchanged compared to 07/23/2011.  2 mm anterolisthesis of C3 on C4. Between 1 mm and 2 mm anterolisthesis of C4 on C5.  Spondylolisthesis is degenerative, associated with chronic degenerative changes.  There is ankylosis of the C4-C5 facet joints bilaterally, with functional fusion.  The craniocervical junction is within normal limits.  Odontoid appears normal.  Multilevel degenerative disc disease in the mid to lower cervical spine.  C3-C4 predominant facet arthrosis.  Central canal appears patent.  No central canal hematoma is identified. Bilateral pleural apical scarring.  Asymmetry at the atlantodental junction is a due to rotation.  Carotid atherosclerosis incidentally noted. Cranial settling is present with the tip of the odontoid projecting superior to the clivus.  IMPRESSION: No acute abnormality.  No cervical spine fracture or dislocation.  Cervical spondylosis and facet arthrosis with ankylosis at C4-C5. Cranial settling.   Original Report Authenticated By: Andreas Newport, M.D.     Ct Angio Neck W/cm &/or Wo/cm  07/25/2012  *RADIOLOGY REPORT*  Clinical Data:  79 year old female with syncope, fall.  Left face bruising.  CT ANGIOGRAPHY HEAD AND NECK  Technique:  Multidetector CT imaging of the head and neck was performed using the standard protocol during bolus administration of intravenous contrast.  Multiplanar CT image reconstructions including MIPs were obtained to evaluate the vascular anatomy. Carotid stenosis measurements (when applicable) are obtained utilizing NASCET criteria, using the distal internal carotid diameter as the denominator.  Contrast: 50mL OMNIPAQUE IOHEXOL 350 MG/ML SOLN  Comparison:  Head and cervical spine CTs 07/24/2012 and earlier.  CTA NECK  Findings:  Left chest cardiac pacemaker visible on the scout view. Mild to moderate scarring in the lung apices.  No superior mediastinal lymphadenopathy. The stable thyroid.  Negative larynx, pharynx, parapharyngeal spaces, retropharyngeal space, sublingual space, and right submandibular and parotid glands.  There is mild inflammation at the left lower face and submandibular space including thickening of the platysma.  No definite involvement of the left submandibular gland.  This probably is post traumatic. Visualized orbit soft tissues are within normal limits.  Visualized paranasal sinuses and mastoids are clear.  Degenerative changes in the cervical spine, severe at the left C1-C2 level.  Vascular Findings: Bovine arch configuration.  Mild arch and great vessel origins soft plaque.  No associated stenosis.  No right CCA origin stenosis.  Negative right CCA proximal to the bifurcation.  Soft and calcified plaque at the right carotid bifurcation but no right ICA origin or proximal segment stenosis. Beginning about 2.5 cm beyond the right ICA origin there is the cervical ICA irregularity with a beaded appearance compatible with fibromuscular dysplasia.  This continues to just below the skull base.  No proximal right  subclavian artery stenosis.  Normal right vertebral artery origin.  Negative cervical right vertebral artery.  Negative left CCA aside from minor soft plaque.  Normal for age left carotid bifurcation.  Mild tortuosity the cervical left ICA. On the left, irregularity of the vessel in keeping with FMD begins at the C2-C3 level and continues to just below the skull base.  No proximal left subclavian artery stenosis.  Normal left vertebral artery origin.  Mild tortuosity the proximal left vertebral artery and intermittently in the cervical spine.  Otherwise negative cervical left vertebral artery.   Review of the MIP images confirms the above findings.  IMPRESSION: 1.  Negative cervical vertebral arteries.  Intracranial findings are below. 2. Right greater than left cervical ICA fibromuscular dysplasia. Right greater than left carotid bifurcation atherosclerosis.  But no hemodynamically significant carotid stenosis in the neck. 3. Mild left face and submandibular space inflammation likely is post traumatic (contusion).  CTA HEAD  Findings:  Negative scalp hematoma.  Calvarium intact.  Stable and normal for age noncontrast CT appearance of the brain.  Following contrast, No abnormal enhancement identified.  Vascular Findings: Major intracranial venous structures are enhancing.  No irregularity of the ICA siphons to suggest intracranial involvement by fibromuscular dysplasia.  Minimal siphon atherosclerosis and no stenosis.  Bilateral ophthalmic and posterior communicating artery origins are within normal limits. Normal carotid termini.  Normal ACA origins.  Dominant left ACA A1 segment.  Anterior communicating artery within normal limits.  Bilateral ACA branches are within normal limits.  Normal MCA origins.  Bilateral MCA branches are within normal limits.  Codominant distal vertebral arteries are normal at the skull base. Normal left PICA.  Patent vertebrobasilar junction.  No distal vertebral artery stenosis.  Mild  basilar tortuosity with no stenosis.  Dominant right AICA.  SCA and PCA origins are normal. Posterior communicating arteries are diminutive.  Bilateral PCA branches are within normal limits.   Review of the MIP images confirms the above findings.  IMPRESSION: 1.  Negative intracranial CTA. 2.  Normal for age CT appearance of the brain.   Original Report Authenticated By: Erskine Speed, M.D.    Ct Cervical Spine Wo Contrast  07/24/2012  *RADIOLOGY REPORT*  Clinical Data:  Fall.  Syncope.  Trauma.  Abrasion to the left cheek and left globe.  Laceration to the lip.  CT HEAD WITHOUT CONTRAST CT MAXILLOFACIAL WITHOUT CONTRAST CT CERVICAL SPINE WITHOUT CONTRAST  Technique:  Multidetector CT imaging of the head, cervical spine, and maxillofacial structures were performed using the standard protocol without intravenous contrast. Multiplanar CT image reconstructions of the cervical spine and maxillofacial structures were also generated.  Comparison:  07/23/2011.  CT HEAD  Findings:  Study is mildly degraded by streak artifact. No mass lesion, mass effect, midline shift, hydrocephalus, hemorrhage.  No territorial ischemia or acute infarction.  IMPRESSION: Negative CT head.  CT MAXILLOFACIAL  Findings:  Dural calcification is present in the left middle cranial fossa.  Soft tissue stranding is present over the left cheek.  The globes and orbits appear within normal limits.  The mandibular condyles are located with bilateral temporomandibular joint degenerative disease and flattening of the condyles.  The both pterygoid plates are intact.  Mastoid air cells appear within normal limits.  Skull base appears normal.  Nasal bones are normal. Orbital floors and medial orbital walls are intact.  Intracranial contents appear within normal limits aside from atherosclerosis.  IMPRESSION: Soft tissue contusion to the left side of the face, predominately affecting the sheath.  No facial fracture.  Severe temporomandibular joint  osteoarthritis.  CT CERVICAL SPINE  Findings:   The alignment cervical spine is unchanged compared to 07/23/2011.  2 mm anterolisthesis of C3 on C4. Between 1 mm and 2 mm anterolisthesis of C4 on C5.  Spondylolisthesis is degenerative, associated with chronic degenerative changes.  There is ankylosis of the C4-C5 facet joints bilaterally, with functional fusion.  The craniocervical junction is within normal limits.  Odontoid appears normal.  Multilevel degenerative disc disease in the mid to lower cervical spine.  C3-C4 predominant facet arthrosis.  Central canal appears patent.  No central canal hematoma is identified. Bilateral pleural apical scarring.  Asymmetry at the atlantodental junction is a due to rotation.  Carotid atherosclerosis incidentally noted. Cranial settling is present with the tip of the odontoid projecting superior to the clivus.  IMPRESSION: No acute abnormality.  No cervical spine fracture or dislocation. Cervical spondylosis and facet arthrosis with ankylosis at C4-C5. Cranial settling.   Original Report Authenticated By: Andreas Newport, M.D.    Dg Chest Port 1 View  07/24/2012  *RADIOLOGY REPORT*  Clinical Data: Fall.  Weakness.  PORTABLE CHEST - 1 VIEW  Comparison: Two-view chest 04/14/2011.  Findings: Pacing wires are stable.  Heart size is normal.  Mild emphysematous changes are stable.  No focal airspace disease is evident.  Biapical parenchymal scarring is unchanged.  Previously suggested nodular density at the left apex is not detected.  Advanced degenerative changes are noted at the humeral heads bilaterally.  IMPRESSION:  1.  No acute cardiopulmonary disease. 2.  Stable  mild emphysema and apical scarring. 3.  Postoperative changes of the right axilla. 4.  Advanced degenerative changes within the shoulders bilaterally.   Original Report Authenticated By: Marin Roberts, M.D.    Ct Maxillofacial Wo Cm  07/24/2012  *RADIOLOGY REPORT*  Clinical Data:  Fall.  Syncope.  Trauma.   Abrasion to the left cheek and left globe.  Laceration to the lip.  CT HEAD WITHOUT CONTRAST CT MAXILLOFACIAL WITHOUT CONTRAST CT CERVICAL SPINE WITHOUT CONTRAST  Technique:  Multidetector CT imaging of the head, cervical spine, and maxillofacial structures were performed using the standard protocol without intravenous contrast. Multiplanar CT image reconstructions of the cervical spine and maxillofacial structures were also generated.  Comparison:  07/23/2011.  CT HEAD  Findings:  Study is mildly degraded by streak artifact. No mass lesion, mass effect, midline shift, hydrocephalus, hemorrhage.  No territorial ischemia or acute infarction.  IMPRESSION: Negative CT head.  CT MAXILLOFACIAL  Findings:  Dural calcification is present in the left middle cranial fossa.  Soft tissue stranding is present over the left cheek.  The globes and orbits appear within normal limits.  The mandibular condyles are located with bilateral temporomandibular joint degenerative disease and flattening of the condyles.  The both pterygoid plates are intact.  Mastoid air cells appear within normal limits.  Skull base appears normal.  Nasal bones are normal. Orbital floors and medial orbital walls are intact.  Intracranial contents appear within normal limits aside from atherosclerosis.  IMPRESSION: Soft tissue contusion to the left side of the face, predominately affecting the sheath.  No facial fracture.  Severe temporomandibular joint osteoarthritis.  CT CERVICAL SPINE  Findings:   The alignment cervical spine is unchanged compared to 07/23/2011.  2 mm anterolisthesis of C3 on C4. Between 1 mm and 2 mm anterolisthesis of C4 on C5.  Spondylolisthesis is degenerative, associated with chronic degenerative changes.  There is ankylosis of the C4-C5 facet joints bilaterally, with functional fusion.  The craniocervical junction is within normal limits.  Odontoid appears normal.  Multilevel degenerative disc disease in the mid to lower cervical  spine.  C3-C4 predominant facet arthrosis.  Central canal appears patent.  No central canal hematoma is identified. Bilateral pleural apical scarring.  Asymmetry at the atlantodental junction is a due to rotation.  Carotid atherosclerosis incidentally noted. Cranial settling is present with the tip of the odontoid projecting superior to the clivus.  IMPRESSION: No acute abnormality.  No cervical spine fracture or dislocation. Cervical spondylosis and facet arthrosis with ankylosis at C4-C5. Cranial settling.   Original Report Authenticated By: Andreas Newport, M.D.     MEDICATIONS                                                                                                                        Scheduled: . aspirin  81 mg Oral Daily  . atorvastatin  10 mg Oral Daily  . calcium carbonate  1 tablet Oral BID WC  . cholecalciferol  2,000 Units Oral  Daily  . diltiazem  120 mg Oral Daily  . enoxaparin (LOVENOX) injection  30 mg Subcutaneous Q24H  . estrogens (conjugated)  0.3 mg Oral 2 times weekly  . fludrocortisone  0.1 mg Oral Daily  . multivitamin with minerals  1 tablet Oral Daily  . pantoprazole  40 mg Oral Daily  . sodium chloride  3 mL Intravenous Q12H  . vitamin C  1,000 mg Oral Daily    ASSESSMENT/PLAN:                                                                                                            Syncope:  Patietns orthostatic BP showed significant drop in systolic BP 142 to 109.  This is likely the cause. Internal medicine is starting patient on florinef 0.1 mg daily. Both CTA head and neck along with EEG are within normal limits.  No further recommendations at this time from neurology.  If episodes continue to occur in the absence of cardiogenic source patient may follow up out patient with neurology.  [facility] [address] [place], [place] [address]--[phone]    Assessment and plan discussed with with attending physician and they are in agreement.    Felicie Morn  PA-C [facility] [phone]  07/26/2012, 9:58 AM

## 2012-07-26 NOTE — Discharge Summary (Signed)
CARDIOLOGY DISCHARGE SUMMARY   Patient ID: Joy Newman MRN: [id number] DOB/AGE: [birth year] 79 y.o.  Admit date: 07/24/2012 Discharge date: 07/26/2012  Primary Discharge Diagnosis:    Secondary Discharge Diagnosis:  Active Problems:   Syncope   Consults: Internal medicine, neurology  Procedures:  2-D echocardiogram  Hospital Course: Joy Newman is a 79 y.o. female with a history of CAD and syncope. She recently had a pacemaker placed because of documented heart block. On the day of admission, she had a syncopal episode. She was transported to the hospital and admitted for further evaluation and treatment.  She had some soft tissue trauma to her face but no fractures or other injuries, radiology data are listed below. Her pacemaker was interrogated but showed no significant arrhythmia. Her initial blood pressure was low at 99 systolic but this improved.  A 2-D echo cardiogram was performed with the results below. It showed no significant abnormality. An internal medicine consult was called and she was seen by Dr. Wylene Simmer. He evaluated her for possible vasovagal syncope and her orthostatic vital signs were positive. She was started on Florinef.  A neurology consult was called to evaluate other causes of syncope. She had the radiology testing listed below, but because of her pacemaker, no MRI was performed. An EEG did not show any critical abnormalities. Neurology felt no further in-patient workup was indicated.  After being started on the Florinef, her orthostatic VS improved. She was ambulating without dizziness, chest pain or SOB and considered stable for discharge, to follow up as an outpatient.   Labs:   Lab Results  Component Value Date   WBC 10.2 07/24/2012   HGB 12.6 07/24/2012   HCT 36.4 07/24/2012   MCV 96.3 07/24/2012   PLT 219 07/24/2012     Recent Labs Lab 07/25/12 0206  NA 142  K 3.6  CL 100  CO2 33*  BUN 16  CREATININE 0.93  CALCIUM 9.4    PROT 6.1  BILITOT 0.3  ALKPHOS 65  ALT 13  AST 24  GLUCOSE 96    Recent Labs  07/24/12 2025 07/25/12 0206 07/25/12 0845  TROPONINI <0.30 <0.30 <0.30   Radiology:  07/24/2012 *RADIOLOGY REPORT* CT HEAD Findings: Study is mildly degraded by streak artifact. No mass lesion, mass effect, midline shift, hydrocephalus, hemorrhage. No territorial ischemia or acute infarction. IMPRESSION: Negative CT head.   Dg Chest Port 1 View  07/24/2012 *RADIOLOGY REPORT* Clinical Data: Fall. Weakness. PORTABLE CHEST - 1 VIEW Comparison: Two-view chest 04/14/2011. Findings: Pacing wires are stable. Heart size is normal. Mild emphysematous changes are stable. No focal airspace disease is evident. Biapical parenchymal scarring is unchanged. Previously suggested nodular density at the left apex is not detected. Advanced degenerative changes are noted at the humeral heads bilaterally. IMPRESSION: 1. No acute cardiopulmonary disease. 2. Stable mild emphysema and apical scarring. 3. Postoperative changes of the right axilla. 4. Advanced degenerative changes within the shoulders bilaterally. Original Report Authenticated By: Marin Roberts, M.D.   Ct Maxillofacial Wo Cm  07/24/2012 *RADIOLOGY REPORT* Clinical Data: Fall. Syncope. Trauma. Abrasion to the left cheek and left globe. Laceration to the lip. CT HEAD WITHOUT CONTRAST CT MAXILLOFACIAL WITHOUT CONTRAST CT CERVICAL SPINE WITHOUT CONTRAST Technique: Multidetector CT imaging of the head, cervical spine, and maxillofacial structures were performed using the standard protocol without intravenous contrast. Multiplanar CT image reconstructions of the cervical spine and maxillofacial structures were also generated. Comparison: 07/23/2011. CT HEAD Findings: Study is mildly degraded by streak artifact. No mass lesion,  mass effect, midline shift, hydrocephalus, hemorrhage. No territorial ischemia or acute infarction. IMPRESSION: Negative CT head. CT MAXILLOFACIAL  Findings: Dural calcification is present in the left middle cranial fossa. Soft tissue stranding is present over the left cheek. The globes and orbits appear within normal limits. The mandibular condyles are located with bilateral temporomandibular joint degenerative disease and flattening of the condyles. The both pterygoid plates are intact. Mastoid air cells appear within normal limits. Skull base appears normal. Nasal bones are normal. Orbital floors and medial orbital walls are intact. Intracranial contents appear within normal limits aside from atherosclerosis. IMPRESSION: Soft tissue contusion to the left side of the face, predominately affecting the sheath. No facial fracture. Severe temporomandibular joint osteoarthritis.   CT CERVICAL SPINE Findings: The alignment cervical spine is unchanged compared to 07/23/2011. 2 mm anterolisthesis of C3 on C4. Between 1 mm and 2 mm anterolisthesis of C4 on C5. Spondylolisthesis is degenerative, associated with chronic degenerative changes. There is ankylosis of the C4-C5 facet joints bilaterally, with functional fusion. The craniocervical junction is within normal limits. Odontoid appears normal. Multilevel degenerative disc disease in the mid to lower cervical spine. C3-C4 predominant facet arthrosis. Central canal appears patent. No central canal hematoma is identified. Bilateral pleural apical scarring. Asymmetry at the atlantodental junction is a due to rotation. Carotid atherosclerosis incidentally noted. Cranial settling is present with the tip of the odontoid projecting superior to the clivus. IMPRESSION: No acute abnormality. No cervical spine fracture or dislocation. Cervical spondylosis and facet arthrosis with ankylosis at C4-C5. Cranial settling. Original Report Authenticated By: Andreas Newport, M.D.   Ct Angio Head W/cm &/or Wo Cm 07/25/2012  *RADIOLOGY REPORT*  Clinical Data:  79 year old female with syncope, fall.  Left face bruising.   CTA NECK   Findings:  Left chest cardiac pacemaker visible on the scout view. Mild to moderate scarring in the lung apices.  No superior mediastinal lymphadenopathy. The stable thyroid.  Negative larynx, pharynx, parapharyngeal spaces, retropharyngeal space, sublingual space, and right submandibular and parotid glands.  There is mild inflammation at the left lower face and submandibular space including thickening of the platysma.  No definite involvement of the left submandibular gland.  This probably is post traumatic. Visualized orbit soft tissues are within normal limits.  Visualized paranasal sinuses and mastoids are clear.  Degenerative changes in the cervical spine, severe at the left C1-C2 level.  Vascular Findings: Bovine arch configuration.  Mild arch and great vessel origins soft plaque.  No associated stenosis.  No right CCA origin stenosis.  Negative right CCA proximal to the bifurcation.  Soft and calcified plaque at the right carotid bifurcation but no right ICA origin or proximal segment stenosis. Beginning about 2.5 cm beyond the right ICA origin there is the cervical ICA irregularity with a beaded appearance compatible with fibromuscular dysplasia.  This continues to just below the skull base.  No proximal right subclavian artery stenosis.  Normal right vertebral artery origin.  Negative cervical right vertebral artery.  Negative left CCA aside from minor soft plaque.  Normal for age left carotid bifurcation.  Mild tortuosity the cervical left ICA. On the left, irregularity of the vessel in keeping with FMD begins at the C2-C3 level and continues to just below the skull base.  No proximal left subclavian artery stenosis.  Normal left vertebral artery origin.  Mild tortuosity the proximal left vertebral artery and intermittently in the cervical spine.  Otherwise negative cervical left vertebral artery.   Review of the MIP images  confirms the above findings.  IMPRESSION: 1.  Negative cervical vertebral arteries.   Intracranial findings are below. 2. Right greater than left cervical ICA fibromuscular dysplasia. Right greater than left carotid bifurcation atherosclerosis.  But no hemodynamically significant carotid stenosis in the neck. 3. Mild left face and submandibular space inflammation likely is post traumatic (contusion).    CTA HEAD  Findings:  Negative scalp hematoma.  Calvarium intact.  Stable and normal for age noncontrast CT appearance of the brain.  Following contrast, No abnormal enhancement identified.  Vascular Findings: Major intracranial venous structures are enhancing.  No irregularity of the ICA siphons to suggest intracranial involvement by fibromuscular dysplasia.  Minimal siphon atherosclerosis and no stenosis.  Bilateral ophthalmic and posterior communicating artery origins are within normal limits. Normal carotid termini.  Normal ACA origins.  Dominant left ACA A1 segment.  Anterior communicating artery within normal limits.  Bilateral ACA branches are within normal limits.  Normal MCA origins.  Bilateral MCA branches are within normal limits.  Codominant distal vertebral arteries are normal at the skull base. Normal left PICA.  Patent vertebrobasilar junction.  No distal vertebral artery stenosis.  Mild basilar tortuosity with no stenosis.  Dominant right AICA.  SCA and PCA origins are normal. Posterior communicating arteries are diminutive.  Bilateral PCA branches are within normal limits.   Review of the MIP images confirms the above findings.  IMPRESSION: 1.  Negative intracranial CTA. 2.  Normal for age CT appearance of the brain.   Original Report Authenticated By: Erskine Speed, M.D.    EKG: 24-Jul-2012 12:06:40  Sinus rhythm Nonspecific ST abnormality Vent. rate 77 BPM PR interval 188 ms QRS duration 78 ms QT/QTc 384/435 ms P-R-T axes 84 68 8  Echo: 07/25/2012 Conclusions - Left ventricle: The cavity size was normal. Wall thickness was normal. Systolic function was normal. The  estimated ejection fraction was in the range of 60% to 65%. Wall motion was normal; there were no regional wall motion abnormalities. Features are consistent with a pseudonormal left ventricular filling pattern, with concomitant abnormal relaxation and increased filling pressure (grade 2 diastolic dysfunction). - Aortic valve: There was no stenosis. Mild regurgitation. - Mitral valve: Mildly calcified annulus. Trivial regurgitation. - Left atrium: The atrium was mildly dilated. - Right ventricle: The cavity size was normal. Pacer wire or catheter noted in right ventricle. Systolic function was normal. - Tricuspid valve: Peak RV-RA gradient: 29mm Hg (S). - Pulmonary arteries: PA peak pressure: 34mm Hg (S). - Inferior vena cava: The vessel was normal in size; the respirophasic diameter changes were in the normal range (= 50%); findings are consistent with normal central venous pressure. Impressions: - Normal LV size and systolic function, EF 60-65%. Normal RV size and systolic function. Mild aortic insuffiiciency.  FOLLOW UP PLANS AND APPOINTMENTS Allergies  Allergen Reactions  . Imdur (Isosorbide Mononitrate)     Intolerant and cause headaches and nausea  . Polysporin (Bacitracin-Polymyxin B)     Rash  . Quinine Derivatives     Increase in Heart Rate and Decrease in BP   . Tetracyclines & Related     Rash     Medication List    STOP taking these medications       triamterene-hydrochlorothiazide 37.5-25 MG per tablet  Commonly known as:  MAXZIDE-25      TAKE these medications       aspirin 81 MG tablet  Take 81 mg by mouth daily.     BIOTIN PO  Take 600  mcg by mouth 2 (two) times daily.     Calcium 1500 MG tablet  Take 1,500 mg by mouth 2 (two) times daily.     cholecalciferol 1000 UNITS tablet  Commonly known as:  VITAMIN D  Take 2,000 Units by mouth daily.     diltiazem 120 MG 24 hr tablet  Commonly known as:  CARDIZEM LA  Take 120 mg by mouth daily.      estrogens (conjugated) 0.3 MG tablet  Commonly known as:  PREMARIN  Take 0.3 mg by mouth 2 (two) times a week.     fludrocortisone 0.1 MG tablet  Commonly known as:  FLORINEF  Take 1 tablet (0.1 mg total) by mouth daily.     ibuprofen 200 MG tablet  Commonly known as:  ADVIL,MOTRIN  Take 200 mg by mouth every 6 (six) hours as needed for pain.     MULTIVITAMIN PO  Take 1 tablet by mouth daily.     pantoprazole 40 MG tablet  Commonly known as:  PROTONIX  Take 40 mg by mouth daily.     rosuvastatin 5 MG tablet  Commonly known as:  CRESTOR  Take 5 mg by mouth daily.     vitamin C 1000 MG tablet  Take 1,000 mg by mouth daily.        Discharge Orders   Future Appointments Provider Department Dept Phone   08/01/2012 10:00 AM [facility] Nurse [facility] Main Office [place]) [phone]   Patient should bring all current BP medications.   08/12/2012 9:05 AM [facility] Device Remotes [facility] Main Office [employer]) [phone]   Future Orders Complete By Expires     Diet general  As directed     Scheduling Instructions:      Drink plenty of water, salt use is OK..    Increase activity slowly  As directed       Follow-up Information   Follow up with [facility] On 08/01/2012. (RN will call 4/28; Come to office for RN visit on 5/1 at 10:00 am.)    Contact information:   [address] [place] [address]       BRING ALL MEDICATIONS WITH YOU TO FOLLOW UP APPOINTMENTS  Time spent with patient to include physician time: 37 min Signed: Theodore Demark, PA-C 07/26/2012, 8:17 PM Co-Sign MD  Attending Note:   The patient was seen and examined.  Agree with assessment and plan as noted above.  Changes made to the above note as needed.  See note from day of discharge.  Stable for Dc.  Vesta Mixer, Montez Hageman., MD, [facility] 07/28/2012, 8:52 PM

## 2012-07-26 NOTE — Procedures (Signed)
EEG report.  Brief clinical history:  79 years old female without known risk factors for epilepsy and recurrent syncope despite pacemaker working well. Differential diagnosis is syncope versus seizures. No prior history of frank epileptic seizures.  Technique: this is a 17 channel routine scalp EEG performed at the bedside with bipolar and monopolar montages arranged in accordance to the international 10/20 system of electrode placement. One channel was dedicated to EKG recording.  The study was performed during wakefulness, drowsiness, and stage 2 sleep. Intermittent photic stimulation was utilized as activating procedure.  Description:In the wakeful state, the best background consisted of a medium amplitude, posterior dominant, well sustained, symmetric and reactive 10 Hz rhythm. Drowsiness demonstrated dropout of the alpha rhythm. Stage 2 sleep showed symmetric and synchronous sleep spindles without intermixed epileptiform discharges. Hyperventilation was not performed. Intermittent photic stimulation did induce a normal driving response.  No focal or generalized epileptiform discharges noted.  Rare, intermittent left temporal theta slowing seen.  EKG showed sinus rhythm.  Impression: this is a normal awake and asleep EEG. The presence of intermittent theta slowing can be considered a normal phenomenon in this age population. Please, be aware that a normal EEG does not exclude the possibility of epilepsy.  Clinical correlation is advised.  Wyatt Portela, MD

## 2012-07-29 NOTE — Telephone Encounter (Signed)
Patient returned my call.  Patient states she is doing well today.  She apologizes for missing my calls.  Patient states she is taking all of her medications and understands her discharge instructions.  Patient is aware of her appointment on 5/1 at 10.  Patient verbalizes understanding to bring list of medications to appointment.

## 2012-07-29 NOTE — Telephone Encounter (Signed)
LMTCB

## 2012-07-29 NOTE — Telephone Encounter (Signed)
LMTCB home phone and mobile phone

## 2012-08-01 ENCOUNTER — Ambulatory Visit (INDEPENDENT_AMBULATORY_CARE_PROVIDER_SITE_OTHER): Payer: Medicare Other | Admitting: *Deleted

## 2012-08-01 VITALS — Ht 61.0 in | Wt 112.0 lb

## 2012-08-01 DIAGNOSIS — R55 Syncope and collapse: Secondary | ICD-10-CM

## 2012-08-01 NOTE — Progress Notes (Signed)
Pt had syncopal episode 8 days ago while walking/exercising. No pre warnings prior to fall. This is the second time she has passed out, the first time she had a pacer placed.  Left arm / manual pressures done; Laying 132/56 P 68 Sitting  124/54 P 68 Standing 130/52 P 68 Denied dizziness during pressures, pt informed I will forward for Dr Harvie Bridge review and will forward to Dr Wylene Simmer for an update.

## 2012-08-09 ENCOUNTER — Other Ambulatory Visit: Payer: Self-pay

## 2012-08-09 DIAGNOSIS — Z1231 Encounter for screening mammogram for malignant neoplasm of breast: Secondary | ICD-10-CM

## 2012-08-12 ENCOUNTER — Ambulatory Visit (INDEPENDENT_AMBULATORY_CARE_PROVIDER_SITE_OTHER): Payer: Medicare Other | Admitting: *Deleted

## 2012-08-12 ENCOUNTER — Encounter: Payer: Self-pay | Admitting: Internal Medicine

## 2012-08-12 ENCOUNTER — Other Ambulatory Visit: Payer: Self-pay

## 2012-08-12 DIAGNOSIS — Z950 Presence of cardiac pacemaker: Secondary | ICD-10-CM

## 2012-08-12 DIAGNOSIS — I442 Atrioventricular block, complete: Secondary | ICD-10-CM | POA: Diagnosis not present

## 2012-08-12 LAB — REMOTE PACEMAKER DEVICE
AL AMPLITUDE: 4.2 mv
BAMS-0001: 150 {beats}/min
DEVICE MODEL PM: 7303108
RV LEAD AMPLITUDE: 12 mv

## 2012-08-27 DIAGNOSIS — I951 Orthostatic hypotension: Secondary | ICD-10-CM | POA: Diagnosis not present

## 2012-08-27 DIAGNOSIS — IMO0002 Reserved for concepts with insufficient information to code with codable children: Secondary | ICD-10-CM | POA: Diagnosis not present

## 2012-08-27 DIAGNOSIS — R55 Syncope and collapse: Secondary | ICD-10-CM | POA: Diagnosis not present

## 2012-08-27 DIAGNOSIS — Z950 Presence of cardiac pacemaker: Secondary | ICD-10-CM | POA: Diagnosis not present

## 2012-08-29 ENCOUNTER — Encounter: Payer: Self-pay | Admitting: *Deleted

## 2012-09-25 ENCOUNTER — Ambulatory Visit
Admission: RE | Admit: 2012-09-25 | Discharge: 2012-09-25 | Disposition: A | Discharge status: Home / Self Care | Payer: Medicare Other | Source: Ambulatory Visit

## 2012-09-25 DIAGNOSIS — Z1231 Encounter for screening mammogram for malignant neoplasm of breast: Secondary | ICD-10-CM

## 2012-10-08 DIAGNOSIS — Z85828 Personal history of other malignant neoplasm of skin: Secondary | ICD-10-CM | POA: Diagnosis not present

## 2012-10-08 DIAGNOSIS — Z85820 Personal history of malignant melanoma of skin: Secondary | ICD-10-CM | POA: Diagnosis not present

## 2012-10-08 DIAGNOSIS — L821 Other seborrheic keratosis: Secondary | ICD-10-CM | POA: Diagnosis not present

## 2012-10-08 DIAGNOSIS — D239 Other benign neoplasm of skin, unspecified: Secondary | ICD-10-CM | POA: Diagnosis not present

## 2012-11-11 ENCOUNTER — Encounter: Payer: Medicare Other | Admitting: *Deleted

## 2012-11-13 ENCOUNTER — Encounter: Payer: Self-pay | Admitting: *Deleted

## 2012-11-18 ENCOUNTER — Ambulatory Visit (INDEPENDENT_AMBULATORY_CARE_PROVIDER_SITE_OTHER): Payer: Medicare Other | Admitting: *Deleted

## 2012-11-18 DIAGNOSIS — Z950 Presence of cardiac pacemaker: Secondary | ICD-10-CM

## 2012-11-18 DIAGNOSIS — I442 Atrioventricular block, complete: Secondary | ICD-10-CM | POA: Diagnosis not present

## 2012-11-19 LAB — REMOTE PACEMAKER DEVICE
AL IMPEDENCE PM: 440 Ohm
ATRIAL PACING PM: 27
BAMS-0001: 150 {beats}/min
BATTERY VOLTAGE: 2.96 V
DEVICE MODEL PM: 7303108
RV LEAD IMPEDENCE PM: 440 Ohm

## 2012-12-09 ENCOUNTER — Encounter: Payer: Self-pay | Admitting: Gastroenterology

## 2012-12-26 DIAGNOSIS — Z23 Encounter for immunization: Secondary | ICD-10-CM | POA: Diagnosis not present

## 2013-01-01 ENCOUNTER — Ambulatory Visit (INDEPENDENT_AMBULATORY_CARE_PROVIDER_SITE_OTHER): Payer: Medicare Other | Admitting: Gastroenterology

## 2013-01-01 ENCOUNTER — Encounter: Payer: Self-pay | Admitting: Internal Medicine

## 2013-01-01 ENCOUNTER — Encounter: Payer: Self-pay | Admitting: Gastroenterology

## 2013-01-01 VITALS — BP 110/60 | HR 72 | Ht 61.0 in | Wt 112.5 lb

## 2013-01-01 DIAGNOSIS — Z86010 Personal history of colonic polyps: Secondary | ICD-10-CM

## 2013-01-01 MED ORDER — PEG-KCL-NACL-NASULF-NA ASC-C 100 G PO SOLR: 1.0000 | Freq: Once | ORAL | Status: DC

## 2013-01-01 NOTE — Patient Instructions (Addendum)
You have been scheduled for a colonoscopy with propofol. Please follow written instructions given to you at your visit today.  Please pick up your prep kit at the pharmacy within the next 1-3 days. If you use inhalers (even only as needed), please bring them with you on the day of your procedure. Your physician has requested that you go to www.startemmi.com and enter the access code given to you at your visit today. This web site gives a general overview about your procedure. However, you should still follow specific instructions given to you by our office regarding your preparation for the procedure.  Thank you for choosing me and  Gastroenterology.  Venita Lick. Pleas Koch., MD., Clement[facility] Graham  cc: Guerry Bruin, MD

## 2013-01-01 NOTE — Progress Notes (Signed)
History of Present Illness: This is an 79 year old female returning for followup colonoscopy. She underwent colonoscopy in November 2013 with 3 adenomatous polyps removed. An 11 mm polyp in the hepatic flexure was removed by piecemeal polypectomy and a one year followup colonoscopy was recommended. She has no ongoing gastrointestinal complaints. Denies weight loss, abdominal pain, constipation, diarrhea, change in stool caliber, melena, hematochezia, nausea, vomiting, dysphagia, reflux symptoms, chest pain.   Current Medications, Allergies, Past Medical History, Past Surgical History, Family History and Social History were reviewed in [employer] record.  Physical Exam: General: Well developed , well nourished, no acute distress Head: Normocephalic and atraumatic Eyes:  sclerae anicteric, EOMI Ears: Normal auditory acuity Mouth: No deformity or lesions Lungs: Clear throughout to auscultation Heart: Regular rate and rhythm; no murmurs, rubs or bruits Abdomen: Soft, non tender and non distended. No masses, hepatosplenomegaly or hernias noted. Normal Bowel sounds Rectal: Deferred to colonoscopy Musculoskeletal: Symmetrical with no gross deformities  Pulses:  Normal pulses noted Extremities: No clubbing, cyanosis, edema or deformities noted Neurological: Alert oriented x 4, grossly nonfocal Psychological:  Alert and cooperative. Normal mood and affect  Assessment and Recommendations:  1. Personal history of adenomatous colon polyps. Piecemeal polypectomy in November 2013. Schedule colonoscopy to assess completeness of polypectomy. The risks, benefits, and alternatives to colonoscopy with possible biopsy and possible polypectomy were discussed with the patient and they consent to proceed.

## 2013-01-08 DIAGNOSIS — E785 Hyperlipidemia, unspecified: Secondary | ICD-10-CM | POA: Diagnosis not present

## 2013-01-08 DIAGNOSIS — E559 Vitamin D deficiency, unspecified: Secondary | ICD-10-CM | POA: Diagnosis not present

## 2013-01-08 DIAGNOSIS — I10 Essential (primary) hypertension: Secondary | ICD-10-CM | POA: Diagnosis not present

## 2013-01-15 DIAGNOSIS — Z23 Encounter for immunization: Secondary | ICD-10-CM | POA: Diagnosis not present

## 2013-01-15 DIAGNOSIS — M810 Age-related osteoporosis without current pathological fracture: Secondary | ICD-10-CM | POA: Diagnosis not present

## 2013-01-15 DIAGNOSIS — K2940 Chronic atrophic gastritis without bleeding: Secondary | ICD-10-CM | POA: Diagnosis not present

## 2013-01-15 DIAGNOSIS — Z950 Presence of cardiac pacemaker: Secondary | ICD-10-CM | POA: Diagnosis not present

## 2013-01-15 DIAGNOSIS — Z0000 Encounter for general adult medical examination without abnormal findings: Secondary | ICD-10-CM | POA: Diagnosis not present

## 2013-01-15 DIAGNOSIS — I059 Rheumatic mitral valve disease, unspecified: Secondary | ICD-10-CM | POA: Diagnosis not present

## 2013-01-15 DIAGNOSIS — E559 Vitamin D deficiency, unspecified: Secondary | ICD-10-CM | POA: Diagnosis not present

## 2013-01-15 DIAGNOSIS — E785 Hyperlipidemia, unspecified: Secondary | ICD-10-CM | POA: Diagnosis not present

## 2013-01-15 DIAGNOSIS — M1990 Unspecified osteoarthritis, unspecified site: Secondary | ICD-10-CM | POA: Diagnosis not present

## 2013-01-20 DIAGNOSIS — Z1212 Encounter for screening for malignant neoplasm of rectum: Secondary | ICD-10-CM | POA: Diagnosis not present

## 2013-01-30 ENCOUNTER — Emergency Department (HOSPITAL_COMMUNITY)
Admission: EM | Admit: 2013-01-30 | Discharge: 2013-01-31 | Disposition: A | Discharge status: Home / Self Care | Payer: Medicare Other | Attending: Emergency Medicine | Admitting: Emergency Medicine

## 2013-01-30 ENCOUNTER — Telehealth: Payer: Self-pay | Admitting: Cardiovascular Disease

## 2013-01-30 ENCOUNTER — Telehealth: Payer: Self-pay | Admitting: Internal Medicine

## 2013-01-30 ENCOUNTER — Encounter (HOSPITAL_COMMUNITY): Payer: Self-pay | Admitting: Emergency Medicine

## 2013-01-30 ENCOUNTER — Emergency Department (HOSPITAL_COMMUNITY): Payer: Medicare Other

## 2013-01-30 DIAGNOSIS — Z950 Presence of cardiac pacemaker: Secondary | ICD-10-CM | POA: Insufficient documentation

## 2013-01-30 DIAGNOSIS — Z79899 Other long term (current) drug therapy: Secondary | ICD-10-CM | POA: Insufficient documentation

## 2013-01-30 DIAGNOSIS — Z85828 Personal history of other malignant neoplasm of skin: Secondary | ICD-10-CM | POA: Insufficient documentation

## 2013-01-30 DIAGNOSIS — I2789 Other specified pulmonary heart diseases: Secondary | ICD-10-CM | POA: Diagnosis not present

## 2013-01-30 DIAGNOSIS — Z8719 Personal history of other diseases of the digestive system: Secondary | ICD-10-CM | POA: Insufficient documentation

## 2013-01-30 DIAGNOSIS — I2510 Atherosclerotic heart disease of native coronary artery without angina pectoris: Secondary | ICD-10-CM | POA: Insufficient documentation

## 2013-01-30 DIAGNOSIS — Z87442 Personal history of urinary calculi: Secondary | ICD-10-CM | POA: Insufficient documentation

## 2013-01-30 DIAGNOSIS — I252 Old myocardial infarction: Secondary | ICD-10-CM | POA: Insufficient documentation

## 2013-01-30 DIAGNOSIS — Z791 Long term (current) use of non-steroidal anti-inflammatories (NSAID): Secondary | ICD-10-CM | POA: Insufficient documentation

## 2013-01-30 DIAGNOSIS — I499 Cardiac arrhythmia, unspecified: Secondary | ICD-10-CM | POA: Diagnosis not present

## 2013-01-30 DIAGNOSIS — R404 Transient alteration of awareness: Secondary | ICD-10-CM | POA: Diagnosis not present

## 2013-01-30 DIAGNOSIS — Z9861 Coronary angioplasty status: Secondary | ICD-10-CM | POA: Diagnosis not present

## 2013-01-30 DIAGNOSIS — Z8669 Personal history of other diseases of the nervous system and sense organs: Secondary | ICD-10-CM | POA: Insufficient documentation

## 2013-01-30 DIAGNOSIS — R002 Palpitations: Secondary | ICD-10-CM | POA: Diagnosis not present

## 2013-01-30 DIAGNOSIS — Z85820 Personal history of malignant melanoma of skin: Secondary | ICD-10-CM | POA: Diagnosis not present

## 2013-01-30 DIAGNOSIS — R55 Syncope and collapse: Secondary | ICD-10-CM | POA: Diagnosis not present

## 2013-01-30 DIAGNOSIS — Z7982 Long term (current) use of aspirin: Secondary | ICD-10-CM | POA: Insufficient documentation

## 2013-01-30 DIAGNOSIS — R0602 Shortness of breath: Secondary | ICD-10-CM | POA: Diagnosis not present

## 2013-01-30 DIAGNOSIS — E785 Hyperlipidemia, unspecified: Secondary | ICD-10-CM | POA: Diagnosis not present

## 2013-01-30 LAB — URINALYSIS, ROUTINE W REFLEX MICROSCOPIC
Bilirubin Urine: NEGATIVE
Glucose, UA: NEGATIVE mg/dL
Ketones, ur: NEGATIVE mg/dL
Protein, ur: NEGATIVE mg/dL
pH: 8 (ref 5.0–8.0)

## 2013-01-30 LAB — BASIC METABOLIC PANEL
BUN: 12 mg/dL (ref 6–23)
Calcium: 9.8 mg/dL (ref 8.4–10.5)
Creatinine, Ser: 0.75 mg/dL (ref 0.50–1.10)
GFR calc Af Amer: 88 mL/min — ABNORMAL LOW (ref >90)
GFR calc non Af Amer: 76 mL/min — ABNORMAL LOW (ref >90)
Glucose, Bld: 105 mg/dL — ABNORMAL HIGH (ref 70–99)

## 2013-01-30 LAB — CBC WITH DIFFERENTIAL/PLATELET
Basophils Relative: 1 % (ref 0–1)
Eosinophils Absolute: 0 10*3/uL (ref 0.0–0.7)
Eosinophils Relative: 1 % (ref 0–5)
Hemoglobin: 14 g/dL (ref 12.0–15.0)
Lymphs Abs: 2 10*3/uL (ref 0.7–4.0)
MCH: 34.4 pg — ABNORMAL HIGH (ref 26.0–34.0)
MCHC: 33.9 g/dL (ref 30.0–36.0)
MCV: 101.5 fL — ABNORMAL HIGH (ref 78.0–100.0)
Monocytes Absolute: 0.7 10*3/uL (ref 0.1–1.0)
Monocytes Relative: 9 % (ref 3–12)
Neutrophils Relative %: 64 % (ref 43–77)
RBC: 4.07 MIL/uL (ref 3.87–5.11)

## 2013-01-30 LAB — URINE MICROSCOPIC-ADD ON

## 2013-01-30 MED ORDER — DILTIAZEM HCL 30 MG PO TABS: 30.0000 mg | ORAL_TABLET | Freq: Four times a day (QID) | ORAL | Status: DC | PRN

## 2013-01-30 NOTE — Telephone Encounter (Signed)
Patient scheduled to be seen in office 01/31/13 for device check.

## 2013-01-30 NOTE — ED Notes (Signed)
Onset one day ago and continued today total of 4 periods of shortness of breath one episode witnessed by family member who called EMS.  Alert and answering and following commands appropriate.

## 2013-01-30 NOTE — ED Notes (Signed)
Pt states since last night she has been having short period of what she states she can best describe as SOB, but states that is not a good description.  Pt states she is currently fine, and is at baseline.

## 2013-01-30 NOTE — Telephone Encounter (Signed)
New problem:  PT states she feels like her heart has been stopping and her pacemaker has been kicking in. Pt states she has been feeling dizzy and nauseous..  Pt states  BP is 146/80 and pulse 80... Pt would like to be advised

## 2013-01-30 NOTE — Telephone Encounter (Signed)
error 

## 2013-01-30 NOTE — ED Provider Notes (Signed)
CSN: [id number]     Arrival date & time 01/30/13  1845 History   First MD Initiated Contact with Patient 01/30/13 1848     Chief Complaint  Patient presents with  . Shortness of Breath   (Consider location/radiation/quality/duration/timing/severity/associated sxs/prior Treatment) HPI Comments: Joy Newman is a 79 y.o. female who presents for evaluation of periods of feeling flushed, and near-syncope for one day. There are no apparent causative factors. The duration of symptoms or 5 minutes. There are no other associated symptoms. She has not had chest pain, , back pain, vertigo, or paresthesia. She's been eating well today. She called her cardiologist, and they plan on seeing her tomorrow. Since she had another episode tonight, she decided to come to the emergency department. No other similar symptoms. No headache. No abdominal pain, dysuria, urinary frequency, or constipation. When she's not having symptoms she is able to walk well. There are no other known modifying factors.  Patient is a 79 y.o. female presenting with shortness of breath. The history is provided by the patient.  Shortness of Breath   Past Medical History  Diagnosis Date  . Hyperlipidemia   . Diastolic dysfunction   . CAD (coronary artery disease) no obstruction at cath     Hx of with an anteroapical M.I  . Pulmonary hypertension      Hx of Mild  . Tricuspid regurgitation     Moderate regurgitation  . Kidney stones   . Arteriovenous malformation of colon   . Diverticulosis   . Carotid sinus hypersensitivity   . Syncope   . Hypertension   . PVC and PACs   . Complete heart block-intermittent   . Pacemaker     St Jude  . MVP (mitral valve prolapse)   . Basal cell carcinoma   . Melanoma   . Squamous carcinoma   . Myocardial infarction     2005  . Orthostasis   . Tubular adenoma of colon 02/2012   Past Surgical History  Procedure Laterality Date  . Appendectomy  1954    Hx of  . Tonsillectomy   1971    Hx of  . Abdominal hysterectomy  1980    Hx of  . Cardiac catheterization      Revealed an apical wall motion abnormality  . Cardiovascular stress test  02-21-2006    EF 84%, which revealed an apical defect  . Nasal reconstruction  1981  . Skin cancer excision      several, basal cell, squamous, melanoma  . Pacemaker insertion     Family History  Problem Relation Age of Onset  . Stroke Mother   . Kidney disease Father   . Lung cancer Brother   . Kidney disease Sister   . Stroke Brother   . Heart disease Brother     x 2  . Liver cancer Sister    History  Substance Use Topics  . Smoking status: Never Smoker   . Smokeless tobacco: Never Used  . Alcohol Use: Yes     Comment: Rarely   OB History   Grav Para Term Preterm Abortions TAB SAB Ect Mult Living                 Review of Systems  Respiratory: Positive for shortness of breath.   All other systems reviewed and are negative.    Allergies  Imdur; Polysporin; Quinine derivatives; and Tetracyclines & related  Home Medications   Current Outpatient Rx  Name  Route  Sig  Dispense  Refill  . Ascorbic Acid (VITAMIN C) 1000 MG tablet   Oral   Take 1,000 mg by mouth daily.          Marland Kitchen aspirin EC 81 MG tablet   Oral   Take 81 mg by mouth daily.         Marland Kitchen BIOTIN PO   Oral   Take 600 mg by mouth 2 (two) times daily.          . Calcium Carb-Cholecalciferol (CALCIUM 600 + D PO)   Oral   Take 600 mg by mouth 2 (two) times daily.         . Cholecalciferol (VITAMIN D) 2000 UNITS tablet   Oral   Take 2,000 Units by mouth daily.         Marland Kitchen diltiazem (CARDIZEM LA) 120 MG 24 hr tablet   Oral   Take 120 mg by mouth daily.         Marland Kitchen estrogens, conjugated, (PREMARIN) 0.3 MG tablet   Oral   Take 0.3 mg by mouth 2 (two) times a week. Tuesday and Thursday         . fludrocortisone (FLORINEF) 0.1 MG tablet   Oral   Take 1 tablet (0.1 mg total) by mouth daily.   30 tablet   11   . ibuprofen  (ADVIL,MOTRIN) 200 MG tablet   Oral   Take 200 mg by mouth daily.         . Multiple Vitamin (MULTIVITAMIN WITH MINERALS) TABS tablet   Oral   Take 1 tablet by mouth daily.         Marland Kitchen OVER THE COUNTER MEDICATION   Both Eyes   Place 1 drop into both eyes daily. Eye drops for dry eyes         . pantoprazole (PROTONIX) 40 MG tablet   Oral   Take 40 mg by mouth daily.          . rosuvastatin (CRESTOR) 5 MG tablet   Oral   Take 5 mg by mouth daily.         Marland Kitchen diltiazem (CARDIZEM) 30 MG tablet   Oral   Take 1 tablet (30 mg total) by mouth every 6 (six) hours as needed (palpitations).   30 tablet   0    BP 150/70  Pulse 73  Temp(Src) 98.4 F (36.9 C) (Oral)  Resp 22  SpO2 96% Physical Exam  Nursing note and vitals reviewed. Constitutional: She is oriented to person, place, and time. She appears well-developed.  Elderly, frail  HENT:  Head: Normocephalic and atraumatic.  Eyes: Conjunctivae and EOM are normal. Pupils are equal, round, and reactive to light.  Neck: Normal range of motion and phonation normal. Neck supple.  Cardiovascular: Normal rate, regular rhythm and intact distal pulses.   Pulmonary/Chest: Effort normal and breath sounds normal. She exhibits no tenderness.  Abdominal: Soft. She exhibits no distension. There is no tenderness. There is no guarding.  Musculoskeletal: Normal range of motion.  Neurological: She is alert and oriented to person, place, and time. She exhibits normal muscle tone.  Skin: Skin is warm and dry.  Psychiatric: She has a normal mood and affect. Her behavior is normal. Judgment and thought content normal.    ED Course  Procedures (including critical care time)  Medications - No data to display  Patient Vitals for the past 24 hrs:  BP Temp Temp src Pulse Resp SpO2  01/30/13 2330 150/70 mmHg - -  73 22 96 %  01/30/13 2230 138/58 mmHg - - 68 19 95 %  01/30/13 2215 - - - 40 20 97 %  01/30/13 2200 141/56 mmHg - - 60 16 97 %   01/30/13 2130 138/53 mmHg - - 69 16 97 %  01/30/13 2100 139/54 mmHg - - 68 15 98 %  01/30/13 2030 152/61 mmHg - - 73 18 98 %  01/30/13 1930 142/58 mmHg - - 69 17 97 %  01/30/13 1857 - - - - - 98 %  01/30/13 1855 155/68 mmHg 98.4 F (36.9 C) Oral 73 15 99 %   Pacemaker evaluation, done. I discussed the results with the technician, who reviewed the data. The data indicates that she has periods of atrial tachycardia that last from minutes to hours, which are sometimes coupled with rapid ventricular heart rates up to 140 beats per minute. She had 11 separate episodes today. There was an episode at 7:15 PM tonight, that lasted 1 minute and 14 seconds and had SVT at 173 beats per minute.  I discussed the case with the on-call cardiologist, for [place] (Dr, Tresa Endo). He felt that the patient could be discharged with Cardizem pill in the pocket to use when necessary. He feels that the patient is stable for discharge to followup with her cardiologist service tomorrow at 10:30 AM as scheduled.  Findings discussed with patient and her son. All questions answered.   Date: 01/30/13  Rate: 74  Rhythm: normal sinus rhythm  QRS Axis: normal  PR and QT Intervals: normal  ST/T Wave abnormalities: normal  PR and QRS Conduction Disutrbances:none  Narrative Interpretation:   Old EKG Reviewed: unchanged    Labs Review Labs Reviewed  CBC WITH DIFFERENTIAL - Abnormal; Notable for the following:    MCV 101.5 (*)    MCH 34.4 (*)    All other components within normal limits  BASIC METABOLIC PANEL - Abnormal; Notable for the following:    Sodium 146 (*)    Glucose, Bld 105 (*)    GFR calc non Af Amer 76 (*)    GFR calc Af Amer 88 (*)    All other components within normal limits  URINALYSIS, ROUTINE W REFLEX MICROSCOPIC - Abnormal; Notable for the following:    Hgb urine dipstick SMALL (*)    Leukocytes, UA SMALL (*)    All other components within normal limits  URINE CULTURE  URINE MICROSCOPIC-ADD ON   POCT I-STAT TROPONIN I   Imaging Review Dg Chest Port 1 View  01/30/2013   CLINICAL DATA:  Shortness of breath.  EXAM: PORTABLE CHEST - 1 VIEW  COMPARISON:  07/24/2012  FINDINGS: Vascular clips in the right axilla. Stable left subclavian pacemaker. Mild cardiomegaly as before. Lungs clear. No effusion. Degenerative changes in the shoulders.  IMPRESSION: 1. Stable cardiomegaly and postop changes. No acute disease.   Electronically Signed   By: Oley Balm M.D.   On: 01/30/2013 19:34    EKG Interpretation   None       MDM   1. Palpitations   2. Cardiac arrhythmia      Nonspecific episodes of near-syncope, without syncope. The episodes are self terminated. The episodes are ongoing for several months and apparently worsened today. There is no associated chest pain, shortness of breath, falls, or hemodynamic instability. On screening evaluation here she is stable. Doubt ACS, PE, metabolic instability, or impending vascular collapse. She is stable for discharge.  Nursing Notes Reviewed/ Care Coordinated, and agree without  changes. Applicable Imaging Reviewed.  Interpretation of Laboratory Data incorporated into ED treatment   Plan: Home Medications- Cardizem 30 mg, #30 to use when necessary palpitations that last longer than 5-7 minutes; Home Treatments and Observation- rest; return here if the recommended treatment, does not improve the symptoms; Recommended follow up- cardiology tomorrow as scheduled.    Flint Melter, MD 01/30/13 [phone]

## 2013-01-31 ENCOUNTER — Encounter: Payer: Self-pay | Admitting: Internal Medicine

## 2013-01-31 ENCOUNTER — Ambulatory Visit (INDEPENDENT_AMBULATORY_CARE_PROVIDER_SITE_OTHER): Payer: Medicare Other | Admitting: *Deleted

## 2013-01-31 DIAGNOSIS — G9001 Carotid sinus syncope: Secondary | ICD-10-CM | POA: Diagnosis not present

## 2013-01-31 DIAGNOSIS — I442 Atrioventricular block, complete: Secondary | ICD-10-CM

## 2013-01-31 LAB — PACEMAKER DEVICE OBSERVATION
AL AMPLITUDE: 5 mv
AL IMPEDENCE PM: 460 Ohm
AL THRESHOLD: 0.75 V
ATRIAL PACING PM: 25
BAMS-0001: 150 {beats}/min
BAMS-0003: 70 {beats}/min
BATTERY VOLTAGE: 2.95 V
DEVICE MODEL PM: 7303108
RV LEAD IMPEDENCE PM: 430 Ohm
VENTRICULAR PACING PM: 6.2

## 2013-01-31 NOTE — Progress Notes (Signed)
The patient was seen today in the office for follow up of an ED visit 01/30/13 for c/o witnessed episodes x 3 yesterday where the patient feels nausea and slightly dizzy but no syncope noted and no mental confusion noted.  These episodes last a few minutes.  303 mode switch episodes for 1.2%, - coumadin.  1 VHR shows 1:1 conduction.  Chronic ventricular noise noted that was not able to be reproduced.  Sensitivity reprogrammed to unipolar tip and 4.34mV by industry.

## 2013-02-01 LAB — URINE CULTURE

## 2013-02-07 DIAGNOSIS — IMO0002 Reserved for concepts with insufficient information to code with codable children: Secondary | ICD-10-CM | POA: Diagnosis not present

## 2013-02-07 DIAGNOSIS — I059 Rheumatic mitral valve disease, unspecified: Secondary | ICD-10-CM | POA: Diagnosis not present

## 2013-02-07 DIAGNOSIS — I951 Orthostatic hypotension: Secondary | ICD-10-CM | POA: Diagnosis not present

## 2013-02-07 DIAGNOSIS — R55 Syncope and collapse: Secondary | ICD-10-CM | POA: Diagnosis not present

## 2013-02-07 DIAGNOSIS — Z950 Presence of cardiac pacemaker: Secondary | ICD-10-CM | POA: Diagnosis not present

## 2013-02-07 DIAGNOSIS — Z1331 Encounter for screening for depression: Secondary | ICD-10-CM | POA: Diagnosis not present

## 2013-02-07 DIAGNOSIS — R002 Palpitations: Secondary | ICD-10-CM | POA: Diagnosis not present

## 2013-02-11 ENCOUNTER — Ambulatory Visit (AMBULATORY_SURGERY_CENTER): Payer: Medicare Other | Admitting: Gastroenterology

## 2013-02-11 ENCOUNTER — Encounter: Payer: Self-pay | Admitting: Gastroenterology

## 2013-02-11 VITALS — BP 132/72 | HR 64 | Temp 97.4°F | Resp 19 | Ht 61.0 in | Wt 112.0 lb

## 2013-02-11 DIAGNOSIS — D126 Benign neoplasm of colon, unspecified: Secondary | ICD-10-CM | POA: Diagnosis not present

## 2013-02-11 DIAGNOSIS — R55 Syncope and collapse: Secondary | ICD-10-CM | POA: Diagnosis not present

## 2013-02-11 DIAGNOSIS — I10 Essential (primary) hypertension: Secondary | ICD-10-CM | POA: Diagnosis not present

## 2013-02-11 DIAGNOSIS — Z860100 Personal history of colon polyps, unspecified: Secondary | ICD-10-CM

## 2013-02-11 DIAGNOSIS — I369 Nonrheumatic tricuspid valve disorder, unspecified: Secondary | ICD-10-CM | POA: Diagnosis not present

## 2013-02-11 DIAGNOSIS — I252 Old myocardial infarction: Secondary | ICD-10-CM | POA: Diagnosis not present

## 2013-02-11 DIAGNOSIS — I2510 Atherosclerotic heart disease of native coronary artery without angina pectoris: Secondary | ICD-10-CM | POA: Diagnosis not present

## 2013-02-11 DIAGNOSIS — I059 Rheumatic mitral valve disease, unspecified: Secondary | ICD-10-CM | POA: Diagnosis not present

## 2013-02-11 DIAGNOSIS — I2789 Other specified pulmonary heart diseases: Secondary | ICD-10-CM | POA: Diagnosis not present

## 2013-02-11 MED ORDER — SODIUM CHLORIDE 0.9 % IV SOLN: 500.0000 mL | INTRAVENOUS | Status: DC

## 2013-02-11 NOTE — Progress Notes (Signed)
Propofol given over incremental dosages 

## 2013-02-11 NOTE — Op Note (Signed)
[facility] [address] [place], [address]   COLONOSCOPY PROCEDURE REPORT PATIENT: Joy Newman, Joy Newman  MR#: [id number] BIRTHDATE: 1927/11/19 , 85  yrs. old GENDER: Female ENDOSCOPIST: Meryl Dare, MD, [facility] PROCEDURE DATE:  02/11/2013 PROCEDURE:   Colonoscopy with snare polypectomy First Screening Colonoscopy - Avg.  risk and is 50 yrs.  old or older - No.  Prior Negative Screening - Now for repeat screening. N/A  History of Adenoma - Now for follow-up colonoscopy & has been > or = to 3 yrs.  Yes hx of adenoma.  Has been 3 or more years since last colonoscopy.  History of Adenoma - Now for follow-up colonoscopy & has been > or = to 3 yrs.  No.  It has been less than 3 yrs since last colonoscopy.  Medical reason.  Polyps Removed Today? Yes. ASA CLASS:   Class II INDICATIONS:Patient's personal history of adenomatous colon polyps and piecemeal polypectomy in 02/2012. MEDICATIONS: MAC sedation, administered by CRNA and propofol (Diprivan) 150mg  IV DESCRIPTION OF PROCEDURE:   After the risks benefits and alternatives of the procedure were thoroughly explained, informed consent was obtained.  A digital rectal exam revealed no abnormalities of the rectum.   The LB [id number]  endoscope was introduced through the anus and advanced to the cecum, which was identified by both the appendix and ileocecal valve. No adverse events experienced with a tortuous colon.   The quality of the prep was good, using MoviPrep  The instrument was then slowly withdrawn as the colon was fully examined.  COLON FINDINGS: A sessile polyp measuring 4 mm in size was found at the hepatic flexure associated with polypectomy site scarring.  A polypectomy was performed with a cold snare.  The resection was complete and the polyp tissue was completely retrieved.   Mild diverticulosis was noted in the sigmoid colon. The colon was otherwise normal.  There was no diverticulosis,  inflammation, polyps or cancers unless previously stated.  Retroflexed views revealed small internal hemorrhoids. The time to cecum=3 minutes 58 seconds.  Withdrawal time=9 minutes 15 seconds.  The scope was withdrawn and the procedure completed. COMPLICATIONS: There were no complications.  ENDOSCOPIC IMPRESSION: 1.   Sessile polyp measuring 4 mm at the hepatic flexure; polypectomy performed with a cold snare 2.   Mild diverticulosis was noted in the sigmoid colon 3.   Small internal hemorrhoids  RECOMMENDATIONS: 1.  Await pathology results 2.  Given your age, you will not need another colonoscopy for colon cancer screening or polyp surveillance.  These types of tests usually stop around the age 86.  eSigned:  Meryl Dare, MD, Clementeen Graham 02/11/2013 2:27 PM   cc: Guerry Bruin, MD

## 2013-02-11 NOTE — Patient Instructions (Signed)
FOLLOW DISCHARGE INSTRUCTIONS (BLUE AND GREEN SHEETS).YOU HAD AN ENDOSCOPIC PROCEDURE TODAY AT THE [facility]: Refer to the procedure report that was given to you for any specific questions about what was found during the examination.  If the procedure report does not answer your questions, please call your gastroenterologist to clarify.  If you requested that your care partner not be given the details of your procedure findings, then the procedure report has been included in a sealed envelope for you to review at your convenience later.  YOU SHOULD EXPECT: Some feelings of bloating in the abdomen. Passage of more gas than usual.  Walking can help get rid of the air that was put into your GI tract during the procedure and reduce the bloating. If you had a lower endoscopy (such as a colonoscopy or flexible sigmoidoscopy) you may notice spotting of blood in your stool or on the toilet paper. If you underwent a bowel prep for your procedure, then you may not have a normal bowel movement for a few days.  DIET: Your first meal following the procedure should be a light meal and then it is ok to progress to your normal diet.  A half-sandwich or bowl of soup is an example of a good first meal.  Heavy or fried foods are harder to digest and may make you feel nauseous or bloated.  Likewise meals heavy in dairy and vegetables can cause extra gas to form and this can also increase the bloating.  Drink plenty of fluids but you should avoid alcoholic beverages for 24 hours.  ACTIVITY: Your care partner should take you home directly after the procedure.  You should plan to take it easy, moving slowly for the rest of the day.  You can resume normal activity the day after the procedure however you should NOT DRIVE or use heavy machinery for 24 hours (because of the sedation medicines used during the test).    SYMPTOMS TO REPORT IMMEDIATELY: A gastroenterologist can be reached at any hour.  During normal  business hours, 8:30 AM to 5:00 PM Monday through Friday, call [phone].  After hours and on weekends, please call the GI answering service at [phone] who will take a message and have the physician on call contact you.   Following lower endoscopy (colonoscopy or flexible sigmoidoscopy):  Excessive amounts of blood in the stool  Significant tenderness or worsening of abdominal pains  Swelling of the abdomen that is new, acute  Fever of 100F or higher  F  FOLLOW UP: If any biopsies were taken you will be contacted by phone or by letter within the next 1-3 weeks.  Call your gastroenterologist if you have not heard about the biopsies in 3 weeks.  Our staff will call the home number listed on your records the next business day following your procedure to check on you and address any questions or concerns that you may have at that time regarding the information given to you following your procedure. This is a courtesy call and so if there is no answer at the home number and we have not heard from you through the emergency physician on call, we will assume that you have returned to your regular daily activities without incident.  SIGNATURES/CONFIDENTIALITY: You and/or your care partner have signed paperwork which will be entered into your electronic medical record.  These signatures attest to the fact that that the information above on your After Visit Summary has been reviewed and is understood.  Full responsibility of the confidentiality of this discharge information lies with you and/or your care-partner.  Polyp, diverticulosis, and hemorrhoid information given.  Will not need another colonoscopy due to most of these procedures not being done after 79 years of age.

## 2013-02-11 NOTE — Progress Notes (Signed)
Called to room to assist during endoscopic procedure.  Patient ID and intended procedure confirmed with present staff. Received instructions for my participation in the procedure from the performing physician.  

## 2013-02-12 ENCOUNTER — Telehealth: Payer: Self-pay | Admitting: *Deleted

## 2013-02-12 NOTE — Telephone Encounter (Signed)
  Follow up Call-  Call back number 02/11/2013 02/16/2012  Post procedure Call Back phone  # [phone]  Permission to leave phone message Yes Yes     Patient questions:  Do you have a fever, pain , or abdominal swelling? no Pain Score  0 *  Have you tolerated food without any problems? yes  Have you been able to return to your normal activities? yes  Do you have any questions about your discharge instructions: Diet   no Medications  no Follow up visit  no  Do you have questions or concerns about your Care? no  Actions: * If pain score is 4 or above: No action needed, pain <4.

## 2013-02-17 ENCOUNTER — Encounter: Payer: Self-pay | Admitting: Gastroenterology

## 2013-02-21 ENCOUNTER — Encounter: Payer: Self-pay | Admitting: Internal Medicine

## 2013-04-15 DIAGNOSIS — D239 Other benign neoplasm of skin, unspecified: Secondary | ICD-10-CM | POA: Diagnosis not present

## 2013-04-15 DIAGNOSIS — Z85820 Personal history of malignant melanoma of skin: Secondary | ICD-10-CM | POA: Diagnosis not present

## 2013-04-15 DIAGNOSIS — Z85828 Personal history of other malignant neoplasm of skin: Secondary | ICD-10-CM | POA: Diagnosis not present

## 2013-04-15 DIAGNOSIS — L821 Other seborrheic keratosis: Secondary | ICD-10-CM | POA: Diagnosis not present

## 2013-05-05 ENCOUNTER — Encounter: Payer: Medicare Other | Admitting: Internal Medicine

## 2013-05-14 ENCOUNTER — Other Ambulatory Visit: Payer: Self-pay

## 2013-05-14 MED ORDER — FLUDROCORTISONE ACETATE 0.1 MG PO TABS: 0.1000 mg | ORAL_TABLET | Freq: Every day | ORAL | Status: DC

## 2013-05-28 ENCOUNTER — Encounter: Payer: Medicare Other | Admitting: Internal Medicine

## 2013-06-06 ENCOUNTER — Encounter: Payer: Self-pay | Admitting: Internal Medicine

## 2013-06-06 ENCOUNTER — Ambulatory Visit (INDEPENDENT_AMBULATORY_CARE_PROVIDER_SITE_OTHER): Payer: Medicare Other | Admitting: Internal Medicine

## 2013-06-06 VITALS — BP 110/70 | HR 77 | Ht 62.0 in | Wt 113.8 lb

## 2013-06-06 DIAGNOSIS — I442 Atrioventricular block, complete: Secondary | ICD-10-CM

## 2013-06-06 LAB — PACEMAKER DEVICE OBSERVATION

## 2013-06-06 MED ORDER — APIXABAN 2.5 MG PO TABS: 2.5000 mg | ORAL_TABLET | Freq: Two times a day (BID) | ORAL | Status: DC

## 2013-06-06 NOTE — Progress Notes (Signed)
Patient Care Team: Haywood Pao, MD as PCP - General (Internal Medicine)   HPI  Joy Newman is a 80 y.o. female Seen in followup for pacemaker implantation January 13 for syncope and intermittent complete heart block. She had carotid sinus hypersensitivity and antecedent PR prolongation suggestive of a neurally mediated trigger. His history of myocardial infarction and ischemic heart disease.  Echo 2013 demonstrated normal left ventricular function  She was admitted with syncope 4/14 and noted to be orthostatic. Florinef was initiated. For reasons that are not clear cardiac exam was uptitrated a few months ago from 120--180 mg. She says she feels better with these adjustments  She denies tachypalpitations.  Past Medical History  Diagnosis Date  . Hyperlipidemia   . Diastolic dysfunction   . CAD (coronary artery disease) Newman obstruction at cath     Hx of with an anteroapical M.I  . Pulmonary hypertension      Hx of Mild  . Tricuspid regurgitation     Moderate regurgitation  . Kidney stones   . Arteriovenous malformation of colon   . Diverticulosis   . Carotid sinus hypersensitivity   . Syncope   . Hypertension   . PVC and PACs   . Complete heart block-intermittent   . Pacemaker     St Jude  . MVP (mitral valve prolapse)   . Basal cell carcinoma   . Melanoma   . Squamous carcinoma   . Myocardial infarction     2005  . Orthostasis   . Tubular adenoma of colon 02/2012    Past Surgical History  Procedure Laterality Date  . Appendectomy  1954    Hx of  . Tonsillectomy  1971    Hx of  . Abdominal hysterectomy  1980    Hx of  . Cardiac catheterization      Revealed an apical wall motion abnormality  . Cardiovascular stress test  02-21-2006    EF 84%, which revealed an apical defect  . Nasal reconstruction  1981  . Skin cancer excision      several, basal cell, squamous, melanoma  . Pacemaker insertion      Current Outpatient Prescriptions    Medication Sig Dispense Refill  . Ascorbic Acid (VITAMIN C) 1000 MG tablet Take 1,000 mg by mouth daily.       Marland Kitchen aspirin EC 81 MG tablet Take 81 mg by mouth daily.      Marland Kitchen BIOTIN PO Take 600 mg by mouth 2 (two) times daily.       . Calcium Carb-Cholecalciferol (CALCIUM 600 + D PO) Take 600 mg by mouth 2 (two) times daily.      . Cholecalciferol (VITAMIN D) 2000 UNITS tablet Take 2,000 Units by mouth daily.      Marland Kitchen diltiazem (CARDIZEM CD) 180 MG 24 hr capsule Take 180 mg by mouth daily.      Marland Kitchen estrogens, conjugated, (PREMARIN) 0.3 MG tablet Take 0.3 mg by mouth 2 (two) times a week. Tuesday and Thursday      . fludrocortisone (FLORINEF) 0.1 MG tablet Take 1 tablet (0.1 mg total) by mouth daily.  30 tablet  3  . ibuprofen (ADVIL,MOTRIN) 200 MG tablet Take 200 mg by mouth daily.      . Multiple Vitamin (MULTIVITAMIN WITH MINERALS) TABS tablet Take 1 tablet by mouth daily.      Marland Kitchen OVER THE COUNTER MEDICATION Place 1 drop into both eyes daily. Eye drops for dry eyes      .  pantoprazole (PROTONIX) 40 MG tablet Take 40 mg by mouth daily.       . rosuvastatin (CRESTOR) 5 MG tablet Take 5 mg by mouth daily.       Newman current facility-administered medications for this visit.    Allergies  Allergen Reactions  . Imdur [Isosorbide Mononitrate]     Intolerant and cause headaches and nausea  . Polysporin [Bacitracin-Polymyxin B]     Rash  . Quinine Derivatives     Increase in Heart Rate and Decrease in BP   . Tetracyclines & Related     Rash    Review of Systems negative except from HPI and PMH  Physical Exam BP 110/70  Pulse 77  Ht 5\' 2"  (1.575 m)  Wt 113 lb 12.8 oz (51.619 kg)  BMI 20.81 kg/m2 Well developed and well nourished in Newman acute distress HENT normal E scleral and icterus clear Neck Supple JVP flat; carotids brisk and full Clear to ausculation Device pocket well healed; without hematoma or erythema.  There is Newman tethering Regular rate and rhythm, early systolic murmurs gallops  or rub Soft with active bowel sounds Newman clubbing cyanosis none Edema Alert and oriented, grossly normal motor and sensory function Skin Warm and Dry    Assessment and  Plan   Complete heart block-intermittent  Atrial fibrillation  Orthostatic hypotension  Noise reversion on the ventricular lead stable  Pacemaker-St. Jude The patient's device was interrogated.  The information was reviewed. Newman changes were made in the programming.    The patient has atrial fibrillation noted on her device with episodes of more than an hour and more than 6% of some weeks. As such, I think is appropriate to initiate anticoagulation; we've discussed the role of apixaban similar bleeding risk profile to aspirin which in her we will discontinue.  According to the patient, Dr. RT as follow up for blood work important in the context of her Florinef therapy. Will alsowhether, in the context of orthostatic hypotension further up titration of her calcium blocker is potentially aggravating

## 2013-06-06 NOTE — Patient Instructions (Addendum)
Remote monitoring is used to monitor your Pacemaker  from home. This monitoring reduces the number of office visits required to check your device to one time per year. It allows [place] to keep an eye on the functioning of your device to ensure it is working properly. You are scheduled for a device check from home on 09-08-2013. You may send your transmission at any time that day. If you have a wireless device, the transmission will be sent automatically. After your physician reviews your transmission, you will receive a postcard with your next transmission date.   Your physician has recommended you make the following change in your medication: STOP aspirin. START eliquis 2.5mg  twice daily.   Your physician recommends that you schedule a follow-up appointment in: 3 months with Dr. Caryl Comes

## 2013-06-09 LAB — MDC_IDC_ENUM_SESS_TYPE_INCLINIC
Battery Remaining Longevity: 111.6 mo
Battery Voltage: 2.96 V
Brady Statistic RA Percent Paced: 24 %
Brady Statistic RV Percent Paced: 8.1 %
Date Time Interrogation Session: 20150306154041
Implantable Pulse Generator Model: 2210
Implantable Pulse Generator Serial Number: 7303108
Lead Channel Impedance Value: 412.5 Ohm
Lead Channel Impedance Value: 437.5 Ohm
Lead Channel Pacing Threshold Amplitude: 0.75 V
Lead Channel Pacing Threshold Amplitude: 0.75 V
Lead Channel Pacing Threshold Amplitude: 1.25 V
Lead Channel Pacing Threshold Amplitude: 1.25 V
Lead Channel Pacing Threshold Pulse Width: 0.4 ms
Lead Channel Pacing Threshold Pulse Width: 0.4 ms
Lead Channel Pacing Threshold Pulse Width: 0.4 ms
Lead Channel Pacing Threshold Pulse Width: 0.4 ms
Lead Channel Sensing Intrinsic Amplitude: 12 mV
Lead Channel Sensing Intrinsic Amplitude: 3.8 mV
Lead Channel Setting Pacing Amplitude: 2.5 V
Lead Channel Setting Pacing Amplitude: 2.5 V
Lead Channel Setting Pacing Pulse Width: 0.4 ms
Lead Channel Setting Sensing Sensitivity: 4.5 mV

## 2013-07-16 DIAGNOSIS — N183 Chronic kidney disease, stage 3 unspecified: Secondary | ICD-10-CM | POA: Diagnosis not present

## 2013-07-16 DIAGNOSIS — E559 Vitamin D deficiency, unspecified: Secondary | ICD-10-CM | POA: Diagnosis not present

## 2013-07-16 DIAGNOSIS — M810 Age-related osteoporosis without current pathological fracture: Secondary | ICD-10-CM | POA: Diagnosis not present

## 2013-07-16 DIAGNOSIS — Z950 Presence of cardiac pacemaker: Secondary | ICD-10-CM | POA: Diagnosis not present

## 2013-07-16 DIAGNOSIS — I059 Rheumatic mitral valve disease, unspecified: Secondary | ICD-10-CM | POA: Diagnosis not present

## 2013-07-16 DIAGNOSIS — K2940 Chronic atrophic gastritis without bleeding: Secondary | ICD-10-CM | POA: Diagnosis not present

## 2013-07-16 DIAGNOSIS — E785 Hyperlipidemia, unspecified: Secondary | ICD-10-CM | POA: Diagnosis not present

## 2013-07-16 DIAGNOSIS — D126 Benign neoplasm of colon, unspecified: Secondary | ICD-10-CM | POA: Diagnosis not present

## 2013-07-28 ENCOUNTER — Encounter: Payer: Self-pay | Admitting: Internal Medicine

## 2013-07-29 ENCOUNTER — Telehealth: Payer: Self-pay | Admitting: Internal Medicine

## 2013-07-29 NOTE — Telephone Encounter (Signed)
Spoke with patient. Her primary care physician increased her diltiazem from 120--180 and she complained about the expense. We will prescribe diltiazem 30 mg to be taken as needed one pill every 6 hours in the event of more palpitations.

## 2013-07-30 ENCOUNTER — Other Ambulatory Visit: Payer: Self-pay

## 2013-07-30 MED ORDER — DILTIAZEM HCL 30 MG PO TABS: ORAL_TABLET | ORAL | Status: DC

## 2013-08-26 ENCOUNTER — Other Ambulatory Visit: Payer: Self-pay | Admitting: Cardiovascular Disease

## 2013-08-27 ENCOUNTER — Other Ambulatory Visit: Payer: Self-pay

## 2013-08-27 DIAGNOSIS — Z1231 Encounter for screening mammogram for malignant neoplasm of breast: Secondary | ICD-10-CM

## 2013-09-09 ENCOUNTER — Encounter: Payer: Self-pay | Admitting: Internal Medicine

## 2013-09-09 ENCOUNTER — Ambulatory Visit (INDEPENDENT_AMBULATORY_CARE_PROVIDER_SITE_OTHER): Payer: Medicare Other | Admitting: Internal Medicine

## 2013-09-09 VITALS — BP 155/71 | HR 73 | Ht 62.0 in | Wt 111.6 lb

## 2013-09-09 DIAGNOSIS — Z950 Presence of cardiac pacemaker: Secondary | ICD-10-CM | POA: Diagnosis not present

## 2013-09-09 DIAGNOSIS — I4891 Unspecified atrial fibrillation: Secondary | ICD-10-CM

## 2013-09-09 DIAGNOSIS — I442 Atrioventricular block, complete: Secondary | ICD-10-CM

## 2013-09-09 DIAGNOSIS — R55 Syncope and collapse: Secondary | ICD-10-CM

## 2013-09-09 LAB — MDC_IDC_ENUM_SESS_TYPE_INCLINIC
Battery Remaining Longevity: 129.6 mo
Brady Statistic RA Percent Paced: 24 %
Brady Statistic RV Percent Paced: 9.1 %
Implantable Pulse Generator Model: 2210
Implantable Pulse Generator Serial Number: 7303108
Lead Channel Impedance Value: 425 Ohm
Lead Channel Pacing Threshold Amplitude: 1 V
Lead Channel Pacing Threshold Amplitude: 1 V
Lead Channel Pacing Threshold Amplitude: 1 V
Lead Channel Pacing Threshold Amplitude: 1 V
Lead Channel Pacing Threshold Pulse Width: 0.4 ms
Lead Channel Pacing Threshold Pulse Width: 0.4 ms
Lead Channel Sensing Intrinsic Amplitude: 12 mV
Lead Channel Sensing Intrinsic Amplitude: 3.6 mV
Lead Channel Setting Pacing Amplitude: 2.5 V
MDC IDC MSMT BATTERY VOLTAGE: 2.96 V
MDC IDC MSMT LEADCHNL RA IMPEDANCE VALUE: 437.5 Ohm
MDC IDC MSMT LEADCHNL RA PACING THRESHOLD PULSEWIDTH: 0.4 ms
MDC IDC MSMT LEADCHNL RV PACING THRESHOLD PULSEWIDTH: 0.4 ms
MDC IDC SESS DTM: 20150609124226
MDC IDC SET LEADCHNL RV PACING AMPLITUDE: 2.5 V
MDC IDC SET LEADCHNL RV PACING PULSEWIDTH: 0.4 ms
MDC IDC SET LEADCHNL RV SENSING SENSITIVITY: 4.5 mV

## 2013-09-09 NOTE — Progress Notes (Signed)
Patient Care Team: Haywood Pao, MD as PCP - General (Internal Medicine)   HPI  Joy Newman is a 80 y.o. female Seen in followup for pacemaker implantation January 13 for syncope and intermittent complete heart block. She had carotid sinus hypersensitivity and antecedent PR prolongation suggestive of a neurally mediated trigger. His history of myocardial infarction and ischemic heart disease.  Echo 2013 demonstrated normal left ventricular function  Doing well without recurrent syncope. She denies chest pain or shortness of breath  She has some paroxysms of atrial fibrillation. She is on low-dose apixaban   she got started on florinef   ;  reasons that are not clear to me Past Medical History  Diagnosis Date  . Hyperlipidemia   . Diastolic dysfunction   . CAD (coronary artery disease) no obstruction at cath     Hx of with an anteroapical M.I  . Pulmonary hypertension      Hx of Mild  . Tricuspid regurgitation     Moderate regurgitation  . Kidney stones   . Arteriovenous malformation of colon   . Diverticulosis   . Carotid sinus hypersensitivity   . Syncope   . Hypertension   . PVC and PACs   . Complete heart block-intermittent   . Pacemaker     St Jude  . MVP (mitral valve prolapse)   . Basal cell carcinoma   . Melanoma   . Squamous carcinoma   . Myocardial infarction     2005  . Orthostasis   . Tubular adenoma of colon 02/2012    Past Surgical History  Procedure Laterality Date  . Appendectomy  1954    Hx of  . Tonsillectomy  1971    Hx of  . Abdominal hysterectomy  1980    Hx of  . Cardiac catheterization      Revealed an apical wall motion abnormality  . Cardiovascular stress test  02-21-2006    EF 84%, which revealed an apical defect  . Nasal reconstruction  1981  . Skin cancer excision      several, basal cell, squamous, melanoma  . Pacemaker insertion      Current Outpatient Prescriptions  Medication Sig Dispense Refill  .  apixaban (ELIQUIS) 2.5 MG TABS tablet Take 1 tablet (2.5 mg total) by mouth 2 (two) times daily.  56 tablet  0  . Ascorbic Acid (VITAMIN C) 1000 MG tablet Take 1,000 mg by mouth daily.       Marland Kitchen BIOTIN PO Take 600 mg by mouth 2 (two) times daily.       . Calcium Carb-Cholecalciferol (CALCIUM 600 + D PO) Take 600 mg by mouth 2 (two) times daily.      . Cholecalciferol (VITAMIN D) 2000 UNITS tablet Take 2,000 Units by mouth daily. VITAMIN D3 2000 MG      . diltiazem (CARDIZEM CD) 180 MG 24 hr capsule Take 180 mg by mouth daily.      Marland Kitchen estrogens, conjugated, (PREMARIN) 0.3 MG tablet Take 0.3 mg by mouth 2 (two) times a week. Tuesday and Thursday      . fludrocortisone (FLORINEF) 0.1 MG tablet TAKE 1 TABLET ONCE DAILY.  90 tablet  0  . ibuprofen (ADVIL,MOTRIN) 200 MG tablet Take 200 mg by mouth daily.      . Multiple Vitamin (MULTIVITAMIN WITH MINERALS) TABS tablet Take 1 tablet by mouth daily.      Marland Kitchen OVER THE COUNTER MEDICATION Place 1 drop into both eyes daily.  Eye drops for dry eyes      . pantoprazole (PROTONIX) 40 MG tablet Take 40 mg by mouth daily.       . rosuvastatin (CRESTOR) 5 MG tablet Take 5 mg by mouth daily.       No current facility-administered medications for this visit.    Allergies  Allergen Reactions  . Imdur [Isosorbide Mononitrate]     Intolerant and cause headaches and nausea  . Polysporin [Bacitracin-Polymyxin B]     Rash  . Quinine Derivatives     Increase in Heart Rate and Decrease in BP   . Tetracyclines & Related     Rash    Review of Systems negative except from HPI and PMH  Physical Exam BP 155/71  Pulse 73  Ht 5\' 2"  (1.575 m)  Wt 111 lb 9.6 oz (50.621 kg)  BMI 20.41 kg/m2 Well developed and well nourished in no acute distress HENT normal E scleral and icterus clear Neck Supple JVP flat; carotids brisk and full Clear to ausculation  Regular rate and rhythm, no murmurs gallops or rub Soft with active bowel sounds No clubbing cyanosis  Edema Alert  and oriented, grossly normal motor and sensory function Skin Warm and Dry    Assessment and  Plan  Atrial fibrillation  Pacemaker-St. Jude The patient's device was interrogated.  The information was reviewed. No changes were made in the programming.    Syncope  Intermittent complete heart block  Nose reversion-atrial/ventricular lead  She is doing quite well. She has few episodes of atrial fibrillation. Her blood pressure is reasonably controlled although not sure why she is on Florinef. Attention is to be discontinued.   There have been no intercurrent syncope.

## 2013-09-09 NOTE — Patient Instructions (Signed)

## 2013-09-18 ENCOUNTER — Encounter: Payer: Self-pay | Admitting: Internal Medicine

## 2013-09-26 ENCOUNTER — Ambulatory Visit
Admission: RE | Admit: 2013-09-26 | Discharge: 2013-09-26 | Disposition: A | Discharge status: Home / Self Care | Payer: Medicare Other | Source: Ambulatory Visit

## 2013-09-26 DIAGNOSIS — Z1231 Encounter for screening mammogram for malignant neoplasm of breast: Secondary | ICD-10-CM

## 2013-10-14 DIAGNOSIS — Z85828 Personal history of other malignant neoplasm of skin: Secondary | ICD-10-CM | POA: Diagnosis not present

## 2013-10-14 DIAGNOSIS — L821 Other seborrheic keratosis: Secondary | ICD-10-CM | POA: Diagnosis not present

## 2013-10-14 DIAGNOSIS — Z85820 Personal history of malignant melanoma of skin: Secondary | ICD-10-CM | POA: Diagnosis not present

## 2013-11-06 ENCOUNTER — Other Ambulatory Visit: Payer: Self-pay | Admitting: Cardiovascular Disease

## 2013-11-09 NOTE — Telephone Encounter (Signed)
I have not seen her in over a year.  Her last BP in our office with Dr. Caryl Comes was 155.  I'm not convinced that she needs the florinef.  She should be seen in the office and we will determine whether she needs this or not

## 2013-11-13 NOTE — Telephone Encounter (Signed)
Last refilled in May for 90 day Rx had note to patient needed ov before more refills. No available appointments soon, will forward to Martie Round.

## 2013-11-14 NOTE — Telephone Encounter (Signed)
Per Dr. Olin Pia notes from last ov, Florinef to be d/c'ed

## 2013-11-14 NOTE — Telephone Encounter (Signed)
Spoke with Larene Beach at Dr. Loren Racer office who states Dr. Osborne Casco is in agreement for patient to stop Florinef.  Larene Beach states she will call patient to clarify.

## 2013-11-14 NOTE — Telephone Encounter (Signed)
I called and spoke with patient who states Dr. Osborne Casco is the physician who prescribed Florinef and her understanding when she saw Dr. Caryl Comes in June was that Dr. Caryl Comes was going to advise Dr. Osborne Casco that patient d/c it.  Patient states she has continued to take it because she has not heard otherwise from either Dr. Osborne Casco or Dr. Caryl Comes.  I advised patient that Dr. Caryl Comes is out of the office today and I will try to speak with Dr. Loren Racer nurse to determine whether patient should continue the medication.  Patient verbalized understanding and agreement and thanked me for the call.

## 2013-11-27 ENCOUNTER — Other Ambulatory Visit: Payer: Self-pay | Admitting: Internal Medicine

## 2013-12-01 ENCOUNTER — Encounter: Payer: Self-pay | Admitting: Internal Medicine

## 2013-12-11 ENCOUNTER — Ambulatory Visit (INDEPENDENT_AMBULATORY_CARE_PROVIDER_SITE_OTHER): Payer: Medicare Other | Admitting: *Deleted

## 2013-12-11 ENCOUNTER — Encounter: Payer: Self-pay | Admitting: Internal Medicine

## 2013-12-11 DIAGNOSIS — I442 Atrioventricular block, complete: Secondary | ICD-10-CM

## 2013-12-11 LAB — MDC_IDC_ENUM_SESS_TYPE_REMOTE
Brady Statistic AP VS Percent: 20 %
Brady Statistic AS VP Percent: 3 %
Brady Statistic RA Percent Paced: 23 %
Implantable Pulse Generator Model: 2210
Lead Channel Impedance Value: 410 Ohm
Lead Channel Pacing Threshold Amplitude: 1 V
Lead Channel Pacing Threshold Amplitude: 1 V
Lead Channel Pacing Threshold Pulse Width: 0.4 ms
Lead Channel Sensing Intrinsic Amplitude: 12 mV
Lead Channel Sensing Intrinsic Amplitude: 3.1 mV
Lead Channel Setting Pacing Amplitude: 2.5 V
Lead Channel Setting Pacing Pulse Width: 0.4 ms
Lead Channel Setting Sensing Sensitivity: 4.5 mV
MDC IDC MSMT BATTERY REMAINING LONGEVITY: 96 mo
MDC IDC MSMT BATTERY REMAINING PERCENTAGE: 80 %
MDC IDC MSMT BATTERY VOLTAGE: 2.96 V
MDC IDC MSMT LEADCHNL RA IMPEDANCE VALUE: 430 Ohm
MDC IDC MSMT LEADCHNL RA PACING THRESHOLD PULSEWIDTH: 0.4 ms
MDC IDC PG SERIAL: 7303108
MDC IDC SESS DTM: 20150910070403
MDC IDC SET LEADCHNL RA PACING AMPLITUDE: 2.5 V
MDC IDC STAT BRADY AP VP PERCENT: 4.5 %
MDC IDC STAT BRADY AS VS PERCENT: 72 %
MDC IDC STAT BRADY RV PERCENT PACED: 8.4 %

## 2013-12-11 NOTE — Progress Notes (Signed)
Remote pacemaker transmission.   

## 2014-01-05 ENCOUNTER — Encounter: Payer: Self-pay | Admitting: Cardiology

## 2014-01-13 DIAGNOSIS — R8299 Other abnormal findings in urine: Secondary | ICD-10-CM | POA: Diagnosis not present

## 2014-01-13 DIAGNOSIS — E559 Vitamin D deficiency, unspecified: Secondary | ICD-10-CM | POA: Diagnosis not present

## 2014-01-13 DIAGNOSIS — Z23 Encounter for immunization: Secondary | ICD-10-CM | POA: Diagnosis not present

## 2014-01-13 DIAGNOSIS — E785 Hyperlipidemia, unspecified: Secondary | ICD-10-CM | POA: Diagnosis not present

## 2014-01-13 DIAGNOSIS — I10 Essential (primary) hypertension: Secondary | ICD-10-CM | POA: Diagnosis not present

## 2014-01-13 DIAGNOSIS — M810 Age-related osteoporosis without current pathological fracture: Secondary | ICD-10-CM | POA: Diagnosis not present

## 2014-01-20 DIAGNOSIS — I341 Nonrheumatic mitral (valve) prolapse: Secondary | ICD-10-CM | POA: Diagnosis not present

## 2014-01-20 DIAGNOSIS — E559 Vitamin D deficiency, unspecified: Secondary | ICD-10-CM | POA: Diagnosis not present

## 2014-01-20 DIAGNOSIS — Z0000 Encounter for general adult medical examination without abnormal findings: Secondary | ICD-10-CM | POA: Diagnosis not present

## 2014-01-20 DIAGNOSIS — M810 Age-related osteoporosis without current pathological fracture: Secondary | ICD-10-CM | POA: Diagnosis not present

## 2014-01-20 DIAGNOSIS — K2950 Unspecified chronic gastritis without bleeding: Secondary | ICD-10-CM | POA: Diagnosis not present

## 2014-01-20 DIAGNOSIS — E785 Hyperlipidemia, unspecified: Secondary | ICD-10-CM | POA: Diagnosis not present

## 2014-01-20 DIAGNOSIS — I10 Essential (primary) hypertension: Secondary | ICD-10-CM | POA: Diagnosis not present

## 2014-01-20 DIAGNOSIS — M1990 Unspecified osteoarthritis, unspecified site: Secondary | ICD-10-CM | POA: Diagnosis not present

## 2014-01-22 DIAGNOSIS — Z1212 Encounter for screening for malignant neoplasm of rectum: Secondary | ICD-10-CM | POA: Diagnosis not present

## 2014-02-01 ENCOUNTER — Encounter: Payer: Self-pay | Admitting: Internal Medicine

## 2014-03-12 ENCOUNTER — Encounter (HOSPITAL_COMMUNITY): Payer: Self-pay | Admitting: Internal Medicine

## 2014-03-16 ENCOUNTER — Encounter: Payer: Self-pay | Admitting: Internal Medicine

## 2014-03-16 ENCOUNTER — Ambulatory Visit (INDEPENDENT_AMBULATORY_CARE_PROVIDER_SITE_OTHER): Payer: Medicare Other | Admitting: *Deleted

## 2014-03-16 DIAGNOSIS — I442 Atrioventricular block, complete: Secondary | ICD-10-CM

## 2014-03-16 LAB — MDC_IDC_ENUM_SESS_TYPE_REMOTE
Battery Remaining Longevity: 94 mo
Battery Remaining Percentage: 78 %
Brady Statistic AP VP Percent: 4.2 %
Brady Statistic AP VS Percent: 19 %
Brady Statistic AS VP Percent: 3.1 %
Brady Statistic AS VS Percent: 74 %
Implantable Pulse Generator Serial Number: 7303108
Lead Channel Impedance Value: 410 Ohm
Lead Channel Impedance Value: 430 Ohm
Lead Channel Pacing Threshold Amplitude: 1 V
Lead Channel Pacing Threshold Pulse Width: 0.4 ms
Lead Channel Setting Pacing Amplitude: 2.5 V
Lead Channel Setting Pacing Pulse Width: 0.4 ms
Lead Channel Setting Sensing Sensitivity: 4.5 mV
MDC IDC MSMT BATTERY VOLTAGE: 2.96 V
MDC IDC MSMT LEADCHNL RA PACING THRESHOLD AMPLITUDE: 1 V
MDC IDC MSMT LEADCHNL RA PACING THRESHOLD PULSEWIDTH: 0.4 ms
MDC IDC MSMT LEADCHNL RA SENSING INTR AMPL: 3 mV
MDC IDC MSMT LEADCHNL RV SENSING INTR AMPL: 12 mV — AB
MDC IDC SESS DTM: 20151214074012
MDC IDC SET LEADCHNL RV PACING AMPLITUDE: 2.5 V
MDC IDC STAT BRADY RA PERCENT PACED: 22 %
MDC IDC STAT BRADY RV PERCENT PACED: 8.1 %

## 2014-03-16 NOTE — Progress Notes (Signed)
Remote pacemaker transmission.   

## 2014-04-09 ENCOUNTER — Encounter: Payer: Self-pay | Admitting: Cardiology

## 2014-06-17 ENCOUNTER — Encounter: Payer: Self-pay | Admitting: Internal Medicine

## 2014-06-17 ENCOUNTER — Ambulatory Visit (INDEPENDENT_AMBULATORY_CARE_PROVIDER_SITE_OTHER): Payer: Medicare Other | Admitting: *Deleted

## 2014-06-17 DIAGNOSIS — I442 Atrioventricular block, complete: Secondary | ICD-10-CM

## 2014-06-17 LAB — MDC_IDC_ENUM_SESS_TYPE_REMOTE
Battery Remaining Percentage: 73 %
Battery Voltage: 2.95 V
Brady Statistic AP VS Percent: 17 %
Brady Statistic AS VS Percent: 76 %
Brady Statistic RV Percent Paced: 7.2 %
Implantable Pulse Generator Model: 2210
Implantable Pulse Generator Serial Number: 7303108
Lead Channel Impedance Value: 380 Ohm
Lead Channel Impedance Value: 430 Ohm
Lead Channel Pacing Threshold Amplitude: 1 V
Lead Channel Pacing Threshold Pulse Width: 0.4 ms
Lead Channel Pacing Threshold Pulse Width: 0.4 ms
Lead Channel Sensing Intrinsic Amplitude: 3.3 mV
Lead Channel Setting Pacing Amplitude: 2.5 V
Lead Channel Setting Sensing Sensitivity: 4.5 mV
MDC IDC MSMT BATTERY REMAINING LONGEVITY: 88 mo
MDC IDC MSMT LEADCHNL RV PACING THRESHOLD AMPLITUDE: 1 V
MDC IDC MSMT LEADCHNL RV SENSING INTR AMPL: 12 mV
MDC IDC SESS DTM: 20160316060843
MDC IDC SET LEADCHNL RV PACING AMPLITUDE: 2.5 V
MDC IDC SET LEADCHNL RV PACING PULSEWIDTH: 0.4 ms
MDC IDC STAT BRADY AP VP PERCENT: 3.6 %
MDC IDC STAT BRADY AS VP PERCENT: 2.9 %
MDC IDC STAT BRADY RA PERCENT PACED: 20 %

## 2014-06-17 NOTE — Progress Notes (Signed)
Remote pacemaker transmission.   

## 2014-07-07 ENCOUNTER — Encounter: Payer: Self-pay | Admitting: Cardiology

## 2014-07-15 DIAGNOSIS — M1990 Unspecified osteoarthritis, unspecified site: Secondary | ICD-10-CM | POA: Diagnosis not present

## 2014-07-15 DIAGNOSIS — I341 Nonrheumatic mitral (valve) prolapse: Secondary | ICD-10-CM | POA: Diagnosis not present

## 2014-07-15 DIAGNOSIS — Z6821 Body mass index (BMI) 21.0-21.9, adult: Secondary | ICD-10-CM | POA: Diagnosis not present

## 2014-07-15 DIAGNOSIS — Z950 Presence of cardiac pacemaker: Secondary | ICD-10-CM | POA: Diagnosis not present

## 2014-07-15 DIAGNOSIS — M810 Age-related osteoporosis without current pathological fracture: Secondary | ICD-10-CM | POA: Diagnosis not present

## 2014-07-15 DIAGNOSIS — K2950 Unspecified chronic gastritis without bleeding: Secondary | ICD-10-CM | POA: Diagnosis not present

## 2014-07-15 DIAGNOSIS — N183 Chronic kidney disease, stage 3 (moderate): Secondary | ICD-10-CM | POA: Diagnosis not present

## 2014-07-15 DIAGNOSIS — E785 Hyperlipidemia, unspecified: Secondary | ICD-10-CM | POA: Diagnosis not present

## 2014-07-15 DIAGNOSIS — I10 Essential (primary) hypertension: Secondary | ICD-10-CM | POA: Diagnosis not present

## 2014-07-15 DIAGNOSIS — E559 Vitamin D deficiency, unspecified: Secondary | ICD-10-CM | POA: Diagnosis not present

## 2014-08-28 ENCOUNTER — Other Ambulatory Visit: Payer: Self-pay

## 2014-08-28 DIAGNOSIS — Z1231 Encounter for screening mammogram for malignant neoplasm of breast: Secondary | ICD-10-CM

## 2014-09-28 ENCOUNTER — Ambulatory Visit
Admission: RE | Admit: 2014-09-28 | Discharge: 2014-09-28 | Disposition: A | Discharge status: Home / Self Care | Payer: Medicare Other | Source: Ambulatory Visit

## 2014-09-28 DIAGNOSIS — Z1231 Encounter for screening mammogram for malignant neoplasm of breast: Secondary | ICD-10-CM | POA: Diagnosis not present

## 2014-09-30 ENCOUNTER — Encounter: Payer: Self-pay | Admitting: Internal Medicine

## 2014-09-30 ENCOUNTER — Ambulatory Visit (INDEPENDENT_AMBULATORY_CARE_PROVIDER_SITE_OTHER): Payer: Medicare Other | Admitting: Internal Medicine

## 2014-09-30 VITALS — BP 146/60 | HR 64 | Ht 62.0 in | Wt 113.6 lb

## 2014-09-30 DIAGNOSIS — I442 Atrioventricular block, complete: Secondary | ICD-10-CM | POA: Diagnosis not present

## 2014-09-30 DIAGNOSIS — Z45018 Encounter for adjustment and management of other part of cardiac pacemaker: Secondary | ICD-10-CM | POA: Diagnosis not present

## 2014-09-30 DIAGNOSIS — I480 Paroxysmal atrial fibrillation: Secondary | ICD-10-CM

## 2014-09-30 LAB — CUP PACEART INCLINIC DEVICE CHECK
Battery Remaining Longevity: 121.2 mo
Brady Statistic RA Percent Paced: 20 %
Brady Statistic RV Percent Paced: 7 %
Lead Channel Pacing Threshold Amplitude: 0.5 V
Lead Channel Pacing Threshold Amplitude: 1.25 V
Lead Channel Pacing Threshold Pulse Width: 0.4 ms
Lead Channel Pacing Threshold Pulse Width: 0.4 ms
Lead Channel Sensing Intrinsic Amplitude: 12 mV
Lead Channel Setting Pacing Amplitude: 2.5 V
Lead Channel Setting Pacing Amplitude: 2.5 V
Lead Channel Setting Pacing Pulse Width: 0.4 ms
Lead Channel Setting Sensing Sensitivity: 4.5 mV
MDC IDC MSMT BATTERY VOLTAGE: 2.95 V
MDC IDC MSMT LEADCHNL RA IMPEDANCE VALUE: 362.5 Ohm
MDC IDC MSMT LEADCHNL RA SENSING INTR AMPL: 3.5 mV
MDC IDC MSMT LEADCHNL RV IMPEDANCE VALUE: 412.5 Ohm
MDC IDC PG SERIAL: 7303108
MDC IDC SESS DTM: 20160629140002

## 2014-09-30 NOTE — Progress Notes (Signed)
Patient Care Team: Drenda Freeze, MD as PCP - General (Internal Medicine)   HPI  Joy Newman is a 81 y.o. female Seen in followup for pacemaker implantation January 13 for syncope and intermittent complete heart block. She had carotid sinus hypersensitivity and antecedent PR prolongation suggestive of a neurally mediated trigger. His history of myocardial infarction and ischemic heart disease.  Echo 2013 demonstrated normal left ventricular function  Doing well without recurrent syncope. She denies chest pain or shortness of breath  She has some paroxysms of atrial fibrillation. She is on low-dose apixaban  Past Medical History  Diagnosis Date  . Hyperlipidemia   . Diastolic dysfunction   . CAD (coronary artery disease) no obstruction at cath     Hx of with an anteroapical M.I  . Pulmonary hypertension      Hx of Mild  . Tricuspid regurgitation     Moderate regurgitation  . Kidney stones   . Arteriovenous malformation of colon   . Diverticulosis   . Carotid sinus hypersensitivity   . Syncope   . Hypertension   . PVC and PACs   . Complete heart block-intermittent   . Pacemaker     St Jude  . MVP (mitral valve prolapse)   . Basal cell carcinoma   . Melanoma   . Squamous carcinoma   . Myocardial infarction     2005  . Orthostasis   . Tubular adenoma of colon 02/2012    Past Surgical History  Procedure Laterality Date  . Appendectomy  1954    Hx of  . Tonsillectomy  1971    Hx of  . Abdominal hysterectomy  1980    Hx of  . Cardiac catheterization      Revealed an apical wall motion abnormality  . Cardiovascular stress test  02-21-2006    EF 84%, which revealed an apical defect  . Nasal reconstruction  1981  . Skin cancer excision      several, basal cell, squamous, melanoma  . Pacemaker insertion    . Permanent pacemaker insertion N/A 04/13/2011    Procedure: PERMANENT PACEMAKER INSERTION;  Surgeon: Evans Lance, MD;  Location: [facility] CATH LAB;   Service: Cardiovascular;  Laterality: N/A;    Current Outpatient Prescriptions  Medication Sig Dispense Refill  . Ascorbic Acid (VITAMIN C) 1000 MG tablet Take 1,000 mg by mouth daily.     Marland Kitchen BIOTIN PO Take 600 mg by mouth 2 (two) times daily.     . Calcium Carb-Cholecalciferol (CALCIUM 600 + D PO) Take 600 mg by mouth 2 (two) times daily.    . Cholecalciferol (VITAMIN D) 2000 UNITS tablet Take 2,000 Units by mouth daily. VITAMIN D3 2000 MG    . diltiazem (CARDIZEM CD) 180 MG 24 hr capsule Take 180 mg by mouth daily.    Marland Kitchen ELIQUIS 2.5 MG TABS tablet TAKE 1 TABLET TWICE DAILY. 60 tablet 11  . Multiple Vitamin (MULTIVITAMIN WITH MINERALS) TABS tablet Take 1 tablet by mouth daily.    Marland Kitchen OVER THE COUNTER MEDICATION Place 1 drop into both eyes daily. Eye drops for dry eyes    . pantoprazole (PROTONIX) 40 MG tablet Take 40 mg by mouth daily.     . rosuvastatin (CRESTOR) 5 MG tablet Take 5 mg by mouth daily.     No current facility-administered medications for this visit.    Allergies  Allergen Reactions  . Imdur [Isosorbide Mononitrate]     Intolerant and cause headaches and  nausea  . Polysporin [Bacitracin-Polymyxin B]     Rash  . Quinine Derivatives     Increase in Heart Rate and Decrease in BP   . Tetracyclines & Related     Rash    Review of Systems negative except from HPI and PMH  Physical Exam BP 146/60 mmHg  Pulse 64  Ht 5\' 2"  (1.575 m)  Wt 113 lb 9.6 oz (51.529 kg)  BMI 20.77 kg/m2 Well developed and well nourished in no acute distress HENT normal E scleral and icterus clear Neck Supple JVP flat; carotids brisk and full Clear to ausculation  Regular rate and rhythm, no murmurs gallops or rub Soft with active bowel sounds No clubbing cyanosis  Edema Alert and oriented, grossly normal motor and sensory function Skin Warm and Dry  ECG  Sinus 64  21/07/40 Ow normal   Assessment and  Plan  Atrial fibrillation  Pacemaker-St. Jude The patient's device was  interrogated.  The information was reviewed. No changes were made in the programming.    Syncope-carotid sinus hypersensitivity  Intermittent complete heart block  Nose reversion-atrial/ventricular lead    Stable  Continue  Current meds

## 2014-09-30 NOTE — Patient Instructions (Signed)
Medication Instructions:  Your physician recommends that you continue on your current medications as directed. Please refer to the Current Medication list given to you today.  Labwork: None ordered  Testing/Procedures: None ordered  Follow-Up: Remote monitoring is used to monitor your Pacemaker of ICD from home. This monitoring reduces the number of office visits required to check your device to one time per year. It allows [place] to keep an eye on the functioning of your device to ensure it is working properly. You are scheduled for a device check from home on 12/30/14. You may send your transmission at any time that day. If you have a wireless device, the transmission will be sent automatically. After your physician reviews your transmission, you will receive a postcard with your next transmission date.  Your physician wants you to follow-up in: 1 year with Chanetta Marshall, NP.  You will receive a reminder letter in the mail two months in advance. If you don't receive a letter, please call our office to schedule the follow-up appointment.  Thank you for choosing [facility]!!

## 2014-10-19 DIAGNOSIS — Z85828 Personal history of other malignant neoplasm of skin: Secondary | ICD-10-CM | POA: Diagnosis not present

## 2014-10-19 DIAGNOSIS — Z85820 Personal history of malignant melanoma of skin: Secondary | ICD-10-CM | POA: Diagnosis not present

## 2014-10-19 DIAGNOSIS — L570 Actinic keratosis: Secondary | ICD-10-CM | POA: Diagnosis not present

## 2014-10-19 DIAGNOSIS — D485 Neoplasm of uncertain behavior of skin: Secondary | ICD-10-CM | POA: Diagnosis not present

## 2014-10-19 DIAGNOSIS — L821 Other seborrheic keratosis: Secondary | ICD-10-CM | POA: Diagnosis not present

## 2014-10-19 DIAGNOSIS — C801 Malignant (primary) neoplasm, unspecified: Secondary | ICD-10-CM | POA: Diagnosis not present

## 2014-11-20 ENCOUNTER — Observation Stay (HOSPITAL_COMMUNITY)
Admission: EM | Admit: 2014-11-20 | Discharge: 2014-11-21 | Disposition: A | Discharge status: Home / Self Care | Payer: Medicare Other | Attending: Family Medicine | Admitting: Family Medicine

## 2014-11-20 ENCOUNTER — Emergency Department (HOSPITAL_COMMUNITY): Payer: Medicare Other

## 2014-11-20 ENCOUNTER — Encounter (HOSPITAL_COMMUNITY): Payer: Self-pay | Admitting: *Deleted

## 2014-11-20 DIAGNOSIS — Z7902 Long term (current) use of antithrombotics/antiplatelets: Secondary | ICD-10-CM | POA: Insufficient documentation

## 2014-11-20 DIAGNOSIS — I10 Essential (primary) hypertension: Secondary | ICD-10-CM | POA: Insufficient documentation

## 2014-11-20 DIAGNOSIS — M47892 Other spondylosis, cervical region: Secondary | ICD-10-CM | POA: Insufficient documentation

## 2014-11-20 DIAGNOSIS — I252 Old myocardial infarction: Secondary | ICD-10-CM | POA: Insufficient documentation

## 2014-11-20 DIAGNOSIS — I341 Nonrheumatic mitral (valve) prolapse: Secondary | ICD-10-CM | POA: Diagnosis present

## 2014-11-20 DIAGNOSIS — Z950 Presence of cardiac pacemaker: Secondary | ICD-10-CM | POA: Diagnosis not present

## 2014-11-20 DIAGNOSIS — S065X0A Traumatic subdural hemorrhage without loss of consciousness, initial encounter: Secondary | ICD-10-CM | POA: Insufficient documentation

## 2014-11-20 DIAGNOSIS — S065X9A Traumatic subdural hemorrhage with loss of consciousness of unspecified duration, initial encounter: Secondary | ICD-10-CM | POA: Diagnosis present

## 2014-11-20 DIAGNOSIS — S0993XA Unspecified injury of face, initial encounter: Secondary | ICD-10-CM | POA: Diagnosis not present

## 2014-11-20 DIAGNOSIS — Y929 Unspecified place or not applicable: Secondary | ICD-10-CM | POA: Insufficient documentation

## 2014-11-20 DIAGNOSIS — Y9301 Activity, walking, marching and hiking: Secondary | ICD-10-CM | POA: Diagnosis not present

## 2014-11-20 DIAGNOSIS — W1830XA Fall on same level, unspecified, initial encounter: Secondary | ICD-10-CM | POA: Diagnosis not present

## 2014-11-20 DIAGNOSIS — Z85820 Personal history of malignant melanoma of skin: Secondary | ICD-10-CM | POA: Diagnosis not present

## 2014-11-20 DIAGNOSIS — S032XXA Dislocation of tooth, initial encounter: Secondary | ICD-10-CM

## 2014-11-20 DIAGNOSIS — I270 Primary pulmonary hypertension: Secondary | ICD-10-CM | POA: Diagnosis not present

## 2014-11-20 DIAGNOSIS — S01511A Laceration without foreign body of lip, initial encounter: Secondary | ICD-10-CM

## 2014-11-20 DIAGNOSIS — I4891 Unspecified atrial fibrillation: Secondary | ICD-10-CM | POA: Insufficient documentation

## 2014-11-20 DIAGNOSIS — G9001 Carotid sinus syncope: Secondary | ICD-10-CM | POA: Diagnosis present

## 2014-11-20 DIAGNOSIS — R55 Syncope and collapse: Principal | ICD-10-CM | POA: Diagnosis present

## 2014-11-20 DIAGNOSIS — Z79899 Other long term (current) drug therapy: Secondary | ICD-10-CM | POA: Diagnosis not present

## 2014-11-20 DIAGNOSIS — I5189 Other ill-defined heart diseases: Secondary | ICD-10-CM | POA: Diagnosis present

## 2014-11-20 DIAGNOSIS — E119 Type 2 diabetes mellitus without complications: Secondary | ICD-10-CM | POA: Diagnosis not present

## 2014-11-20 DIAGNOSIS — I2510 Atherosclerotic heart disease of native coronary artery without angina pectoris: Secondary | ICD-10-CM | POA: Insufficient documentation

## 2014-11-20 DIAGNOSIS — E785 Hyperlipidemia, unspecified: Secondary | ICD-10-CM | POA: Diagnosis not present

## 2014-11-20 DIAGNOSIS — I493 Ventricular premature depolarization: Secondary | ICD-10-CM | POA: Diagnosis present

## 2014-11-20 DIAGNOSIS — Z87442 Personal history of urinary calculi: Secondary | ICD-10-CM | POA: Diagnosis not present

## 2014-11-20 DIAGNOSIS — S065XAA Traumatic subdural hemorrhage with loss of consciousness status unknown, initial encounter: Secondary | ICD-10-CM | POA: Diagnosis present

## 2014-11-20 DIAGNOSIS — S01521A Laceration with foreign body of lip, initial encounter: Secondary | ICD-10-CM | POA: Diagnosis not present

## 2014-11-20 DIAGNOSIS — S0181XA Laceration without foreign body of other part of head, initial encounter: Secondary | ICD-10-CM | POA: Insufficient documentation

## 2014-11-20 DIAGNOSIS — C4491 Basal cell carcinoma of skin, unspecified: Secondary | ICD-10-CM

## 2014-11-20 LAB — CBC WITH DIFFERENTIAL/PLATELET
BASOS ABS: 0.1 10*3/uL (ref 0.0–0.1)
BASOS PCT: 1 % (ref 0–1)
EOS ABS: 0.2 10*3/uL (ref 0.0–0.7)
EOS PCT: 2 % (ref 0–5)
HCT: 41.8 % (ref 36.0–46.0)
Hemoglobin: 13.5 g/dL (ref 12.0–15.0)
Lymphocytes Relative: 33 % (ref 12–46)
Lymphs Abs: 3.3 10*3/uL (ref 0.7–4.0)
MCH: 33.3 pg (ref 26.0–34.0)
MCHC: 32.3 g/dL (ref 30.0–36.0)
MCV: 103.2 fL — ABNORMAL HIGH (ref 78.0–100.0)
MONO ABS: 1 10*3/uL (ref 0.1–1.0)
Monocytes Relative: 10 % (ref 3–12)
Neutro Abs: 5.6 10*3/uL (ref 1.7–7.7)
Neutrophils Relative %: 54 % (ref 43–77)
PLATELETS: 256 10*3/uL (ref 150–400)
RBC: 4.05 MIL/uL (ref 3.87–5.11)
RDW: 12.9 % (ref 11.5–15.5)
WBC: 10.2 10*3/uL (ref 4.0–10.5)

## 2014-11-20 LAB — BASIC METABOLIC PANEL
Anion gap: 11 (ref 5–15)
BUN: 19 mg/dL (ref 6–20)
CALCIUM: 9.5 mg/dL (ref 8.9–10.3)
CO2: 27 mmol/L (ref 22–32)
CREATININE: 0.81 mg/dL (ref 0.44–1.00)
Chloride: 103 mmol/L (ref 101–111)
GFR calc Af Amer: >60 mL/min (ref >60)
GLUCOSE: 88 mg/dL (ref 65–99)
POTASSIUM: 4.3 mmol/L (ref 3.5–5.1)
SODIUM: 141 mmol/L (ref 135–145)

## 2014-11-20 LAB — I-STAT TROPONIN, ED: TROPONIN I, POC: 0 ng/mL (ref 0.00–0.08)

## 2014-11-20 LAB — HEPARIN LEVEL (UNFRACTIONATED): HEPARIN UNFRACTIONATED: 1.64 [IU]/mL — AB (ref 0.30–0.70)

## 2014-11-20 MED ORDER — ACETAMINOPHEN 325 MG PO TABS: 650.0000 mg | ORAL_TABLET | Freq: Four times a day (QID) | ORAL | Status: DC | PRN

## 2014-11-20 MED ORDER — DILTIAZEM HCL ER COATED BEADS 180 MG PO CP24
180.0000 mg | ORAL_CAPSULE | Freq: Every morning | ORAL | Status: DC
Start: 2014-11-21 — End: 2014-11-21
  Administered 2014-11-21: 180 mg via ORAL
  Filled 2014-11-20: qty 1

## 2014-11-20 MED ORDER — AMOXICILLIN-POT CLAVULANATE 875-125 MG PO TABS
1.0000 | ORAL_TABLET | Freq: Two times a day (BID) | ORAL | Status: DC
  Administered 2014-11-20 – 2014-11-21 (×3): 1 via ORAL
  Filled 2014-11-20 (×3): qty 1

## 2014-11-20 MED ORDER — ACETAMINOPHEN 650 MG RE SUPP: 650.0000 mg | Freq: Four times a day (QID) | RECTAL | Status: DC | PRN

## 2014-11-20 MED ORDER — MAGNESIUM HYDROXIDE 400 MG/5ML PO SUSP: 30.0000 mL | Freq: Every day | ORAL | Status: DC | PRN

## 2014-11-20 MED ORDER — SODIUM CHLORIDE 0.9 % IJ SOLN: 3.0000 mL | Freq: Two times a day (BID) | INTRAMUSCULAR | Status: DC

## 2014-11-20 MED ORDER — LIDOCAINE-EPINEPHRINE (PF) 2 %-1:200000 IJ SOLN
20.0000 mL | Freq: Once | INTRAMUSCULAR | Status: AC
  Administered 2014-11-20: 20 mL via INTRADERMAL
  Filled 2014-11-20: qty 20

## 2014-11-20 NOTE — ED Provider Notes (Signed)
CSN: [id number]     Arrival date & time 11/20/14  2703 History   First MD Initiated Contact with Patient 11/20/14 [phone]     No chief complaint on file.    (Consider location/radiation/quality/duration/timing/severity/associated sxs/prior Treatment) HPI  81 year old female presents after a fall while outside walking. She states she does not remember the fall and think she passed out. This is very similar to when she had syncope 2 years ago and required a pacemaker. She has a Corporate investment banker. The patient does not think she tripped but does not completely remember. She is on Eliquis. She denies headache but is having lower jaw pain and knocked out several teeth on her maxilla. These were false teeth as a part of her bridge. Further teeth to the right side of her maxilla are loose. She also suffered a laceration to her chin. Denies trouble swallowing. No weakness or numbness. Has not had any chest pain or shortness of breath before or after the event. Denies feeling ill prior to this. Last tetanus was one year ago by her PCP.  Past Medical History  Diagnosis Date  . Hyperlipidemia   . Diastolic dysfunction   . CAD (coronary artery disease) no obstruction at cath     Hx of with an anteroapical M.I  . Pulmonary hypertension      Hx of Mild  . Tricuspid regurgitation     Moderate regurgitation  . Kidney stones   . Arteriovenous malformation of colon   . Diverticulosis   . Carotid sinus hypersensitivity   . Syncope   . Hypertension   . PVC and PACs   . Complete heart block-intermittent   . Pacemaker     St Jude  . MVP (mitral valve prolapse)   . Basal cell carcinoma   . Melanoma   . Squamous carcinoma   . Myocardial infarction     2005  . Orthostasis   . Tubular adenoma of colon 02/2012   Past Surgical History  Procedure Laterality Date  . Appendectomy  1954    Hx of  . Tonsillectomy  1971    Hx of  . Abdominal hysterectomy  1980    Hx of  . Cardiac catheterization     Revealed an apical wall motion abnormality  . Cardiovascular stress test  02-21-2006    EF 84%, which revealed an apical defect  . Nasal reconstruction  1981  . Skin cancer excision      several, basal cell, squamous, melanoma  . Pacemaker insertion    . Permanent pacemaker insertion N/A 04/13/2011    Procedure: PERMANENT PACEMAKER INSERTION;  Surgeon: Evans Lance, MD;  Location: [facility] CATH LAB;  Service: Cardiovascular;  Laterality: N/A;   Family History  Problem Relation Age of Onset  . Stroke Mother   . Kidney disease Father   . Lung cancer Brother   . Kidney disease Sister   . Stroke Brother   . Heart disease Brother     x 2  . Liver cancer Sister    Social History  Substance Use Topics  . Smoking status: Never Smoker   . Smokeless tobacco: Never Used  . Alcohol Use: Yes     Comment: Rarely   OB History    No data available     Review of Systems  HENT: Positive for dental problem and facial swelling.   Respiratory: Negative for shortness of breath.   Cardiovascular: Negative for chest pain.  Musculoskeletal: Negative for neck pain.  Skin: Positive for wound.  Neurological: Positive for syncope. Negative for weakness and numbness.  All other systems reviewed and are negative.     Allergies  Imdur; Polysporin; Quinine derivatives; and Tetracyclines & related  Home Medications   Prior to Admission medications   Medication Sig Start Date End Date Taking? Authorizing Provider  Ascorbic Acid (VITAMIN C) 1000 MG tablet Take 1,000 mg by mouth daily.     Historical Provider, MD  BIOTIN PO Take 600 mg by mouth 2 (two) times daily.     Historical Provider, MD  Calcium Carb-Cholecalciferol (CALCIUM 600 + D PO) Take 600 mg by mouth 2 (two) times daily.    Historical Provider, MD  Cholecalciferol (VITAMIN D) 2000 UNITS tablet Take 2,000 Units by mouth daily. VITAMIN D3 2000 MG    Historical Provider, MD  diltiazem (CARDIZEM CD) 180 MG 24 hr capsule Take 180 mg by mouth  daily.    Historical Provider, MD  ELIQUIS 2.5 MG TABS tablet TAKE 1 TABLET TWICE DAILY. 11/28/13   Deboraha Sprang, MD  Multiple Vitamin (MULTIVITAMIN WITH MINERALS) TABS tablet Take 1 tablet by mouth daily.    Historical Provider, MD  OVER THE COUNTER MEDICATION Place 1 drop into both eyes daily. Eye drops for dry eyes    Historical Provider, MD  pantoprazole (PROTONIX) 40 MG tablet Take 40 mg by mouth daily.     Historical Provider, MD  rosuvastatin (CRESTOR) 5 MG tablet Take 5 mg by mouth daily.    Historical Provider, MD   There were no vitals taken for this visit. Physical Exam  Constitutional: She is oriented to person, place, and time. She appears well-developed and well-nourished.  HENT:  Head: Normocephalic.    Right Ear: External ear normal.  Left Ear: External ear normal.  Nose: Nose normal.  Mouth/Throat:    Eyes: EOM are normal. Pupils are equal, round, and reactive to light. Right eye exhibits no discharge. Left eye exhibits no discharge.  Neck: Neck supple.  Cardiovascular: Normal rate, regular rhythm and normal heart sounds.   Pulmonary/Chest: Effort normal and breath sounds normal.  Left chest pacemaker in place without tenderness, swelling or redness  Abdominal: Soft. She exhibits no distension. There is no tenderness.  Neurological: She is alert and oriented to person, place, and time.  CN 2-12 grossly intact. 5/5 strength in all 4 extremities  Skin: Skin is warm and dry. She is not diaphoretic.  Nursing note and vitals reviewed.   ED Course  LACERATION REPAIR Date/Time: 11/20/2014 11:04 AM Performed by: Sherwood Gambler Authorized by: Sherwood Gambler Consent: Verbal consent obtained. Risks and benefits: risks, benefits and alternatives were discussed Consent given by: patient Patient identity confirmed: verbally with patient Body area: head/neck Location details: chin Laceration length: 4 cm Contamination: The wound is contaminated. (loose  concrete/gravel) Tendon involvement: none Nerve involvement: none Vascular damage: no Anesthesia: local infiltration Local anesthetic: lidocaine 2% with epinephrine Patient sedated: no Preparation: Patient was prepped and draped in the usual sterile fashion. Irrigation solution: saline Irrigation method: syringe Amount of cleaning: extensive Debridement: none Degree of undermining: none Skin closure: 5-0 Prolene Number of sutures: 3 Technique: simple Approximation: close Approximation difficulty: simple Dressing: 4x4 sterile gauze Patient tolerance: Patient tolerated the procedure well with no immediate complications Comments: Multiple very small loose impediments c/w gravel or concrete pieces were removed manually. I removed everything I could see and then irrigated extensively prior to closure.    (including critical care time) Labs Review Labs Reviewed  CBC WITH DIFFERENTIAL/PLATELET - Abnormal; Notable for the following:    MCV 103.2 (*)    All other components within normal limits  HEPARIN LEVEL (UNFRACTIONATED) - Abnormal; Notable for the following:    Heparin Unfractionated 1.64 (*)    All other components within normal limits  BASIC METABOLIC PANEL  I-STAT TROPOININ, ED    Imaging Review Ct Head Wo Contrast  11/20/2014   CLINICAL DATA:  Facial trauma secondary to a fall this morning.  EXAM: CT HEAD WITHOUT CONTRAST  CT MAXILLOFACIAL WITHOUT CONTRAST  TECHNIQUE: Multidetector CT imaging of the head and maxillofacial structures were performed using the standard protocol without intravenous contrast. Multiplanar CT image reconstructions of the maxillofacial structures were also generated.  COMPARISON:  CT scans dated 07/24/2012  FINDINGS: CT HEAD FINDINGS  There is a tiny acute subdural hemorrhage along the left side of the interhemispheric falx adjacent to the left frontal lobe. This measures 11 x 2.6 mm. No mass effect.  Brain parenchyma is otherwise normal. No mass  lesions. No infarctions. No osseous abnormality.  CT MAXILLOFACIAL FINDINGS  There are no fractures. There is a radiodense material in what appears to be a laceration of the right side of the lower lip.  No sinus opacification. 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  Severe left facet arthritis at C1-2. Diffuse degenerative disc disease in the cervical spine with 2 mm spondylolisthesis of C3 on C4. Auto fusion of C4 and C5.  IMPRESSION: 1. Tiny subdural hematoma along the left side of the anterior aspect of the interhemispheric falx. 2. Radiodense material in the laceration of the right side of the lower lip. 3. No facial fractures.   Electronically Signed   By: Lorriane Shire M.D.   On: 11/20/2014 10:18   Dg Chest Port 1 View  11/20/2014   CLINICAL DATA:  Syncopal episode today.  EXAM: PORTABLE CHEST - 1 VIEW  COMPARISON:  01/30/2013  FINDINGS: Stable appearance of the left dual chamber cardiac pacemaker. Heart size is normal. The trachea is midline. Again noted are surgical clips in the right axilla. There is stable sclerosis and mild deformity of the right humeral head. Again noted are degenerative changes at the left glenohumeral joint. Lungs are clear without airspace disease or pulmonary edema.  IMPRESSION: No acute chest abnormality. Stable appearance of the left cardiac pacemaker.   Electronically Signed   By: Markus Daft M.D.   On: 11/20/2014 10:39   Ct Maxillofacial Wo Cm  11/20/2014   CLINICAL DATA:  Facial trauma secondary to a fall this morning.  EXAM: CT HEAD WITHOUT CONTRAST  CT MAXILLOFACIAL WITHOUT CONTRAST  TECHNIQUE: Multidetector CT imaging of the head and maxillofacial structures were performed using the standard protocol without intravenous contrast. Multiplanar CT image reconstructions of the maxillofacial structures were also generated.  COMPARISON:  CT scans dated 07/24/2012  FINDINGS: CT HEAD FINDINGS  There is a tiny  acute subdural hemorrhage along the left side of the interhemispheric falx adjacent to the left frontal lobe. This measures 11 x 2.6 mm. No mass effect.  Brain parenchyma is otherwise normal. No mass lesions. No infarctions. No osseous abnormality.  CT MAXILLOFACIAL FINDINGS  There are no fractures. There is a radiodense material in what appears to be a laceration of the right side of the lower lip.  No sinus opacification. 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  Severe left facet arthritis at C1-2. Diffuse  degenerative disc disease in the cervical spine with 2 mm spondylolisthesis of C3 on C4. Auto fusion of C4 and C5.  IMPRESSION: 1. Tiny subdural hematoma along the left side of the anterior aspect of the interhemispheric falx. 2. Radiodense material in the laceration of the right side of the lower lip. 3. No facial fractures.   Electronically Signed   By: Lorriane Shire M.D.   On: 11/20/2014 10:18   I have personally reviewed and evaluated these images and lab results as part of my medical decision-making.   EKG Interpretation   Date/Time:  Friday November 20 2014 09:27:09 EDT Ventricular Rate:  85 PR Interval:  185 QRS Duration: 90 QT Interval:  350 QTC Calculation: 416 R Axis:   71 Text Interpretation:  Sinus rhythm Ventricular premature complex Minimal  ST depression, lateral leads no significant change since 2014 Confirmed by  Lenix Kidd  MD, Denim Start ([id number]) on 11/20/2014 9:37:57 AM      MDM   Final diagnoses:  Subdural hematoma  Syncope, unspecified syncope type  Chin laceration, initial encounter  Tooth avulsion, initial encounter    Patient is neurologically intact, CT shows very small subdural. Discussed with Dr. Cyndy Freeze of neurosurgery who recommends 12+ hour CT scan with observation. Does not recommend reversing eliquis at this time. As for her lac I cleaned it as well as I could and removed all visible debris, given  her mouth injury and possible contaminants in wound will place on augmentin. Her bridge was so loose it was removed without any resistance. She states that all the teeth that were knocked out were not real. She has already called her oral surgeon and has a follow up on Monday (today is Friday). Pacemaker interrogation shows no arrythmia to cause her syncope. Admit to hospitalist for observation and repeat CT scan.    Sherwood Gambler, MD 11/20/14 [phone]

## 2014-11-20 NOTE — ED Notes (Signed)
Bed: EZ50 Expected date:  Expected time:  Means of arrival:  Comments: hold

## 2014-11-20 NOTE — ED Notes (Signed)
[phone] Rep Paged

## 2014-11-20 NOTE — ED Notes (Signed)
Pt reports hx of LOC a few years ago, then had pacemaker inserted 1-2 years ago, has not had an LOC episode till today. Pt was walking and next remembers waking up and being helped up off the ground. Pt last LOC knocked out all of front teeth and had a dental bridge installed. Today half of dental bridge was knocked out on left side. Right side dental bridge is very loose. Pt bleeding from mouth. Pt alert and oriented x4.

## 2014-11-20 NOTE — H&P (Signed)
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 Viann Shove Jude placed 04/2011 Sessile Polyp 11 mm Asxc colo, 6 m hepflex, 5 mm sig colo Bilat ICA stenosis 40-59% back in maybe 2011 without repeat Cardiac Cath 11/2002=Apical akinesis-felt 2/2 to spasm BI-RADS 2 2003   She had a fall maybe on 11/13/14 that was nonproductive. She has had no chest pain. On the morning of admission 8/19 she awoke at around 7:30 had a heavy breakfast Gatorade, almonds, peanuts as well as a waff and started walking outside. Typically she is able to walk after her pacemaker procedure about 2-3 miles and at some point in the past was able to do 10,000 steps prior to her pacemaker surgery. She is highly functioning   and does everything for self without any need for assistive device   She went for a walk and after about a block fell straight down and hit her mouth face and her partial dentures completely fell out from her top teeth and she is going to see her dentist on Monday to get it looked at Dentist unclear-Root canal on Monday 8/22 She did not have any loss of stool or urinary incontinence and did not bite her tongue She was a little dizzy when she got up but felt close to normal right away No associated chest pain no diaphoresis no fever no chills no hematemesis no dark stool no tarry stool no lower extremity edema no shortness of breath no cough no cold no riders no chills  neurosurgery was consulted as below as CT scan of the head showed a tiny subdural hematoma NS Dr Cyndy Freeze felt could continue Elliquis without intervention and did not need reversal of same   the emergency room Interrogated PPM and it was  felt to have no issues  Recurrent falls recently Emergency room workup otherwise was negative Orthostatics were negative   Past Medical History  Diagnosis Date  . Hyperlipidemia   . Diastolic dysfunction   . CAD (coronary artery disease) no obstruction at cath     Hx of with an anteroapical M.I  . Pulmonary hypertension      Hx of Mild  . Tricuspid regurgitation     Moderate regurgitation  . Kidney stones   . Arteriovenous malformation of colon   . Diverticulosis   . Carotid sinus hypersensitivity   . Syncope   . Hypertension   . PVC and PACs   . Complete heart block-intermittent   . Pacemaker     St Jude  . MVP (mitral valve prolapse)   . Basal cell carcinoma   . Melanoma   . Squamous carcinoma   . Myocardial infarction     2005  . Orthostasis   . Tubular adenoma of colon 02/2012   Past Surgical History  Procedure Laterality Date  . Appendectomy  1954    Hx of  . Tonsillectomy  1971    Hx of  . Abdominal hysterectomy  1980    Hx of  . Cardiac catheterization      Revealed an apical wall motion abnormality  . Cardiovascular stress test  02-21-2006    EF 84%, which revealed an apical defect  . Nasal reconstruction  1981  . Skin  cancer excision      several, basal cell, squamous, melanoma  . Pacemaker insertion    . Permanent pacemaker insertion N/A 04/13/2011    Procedure: PERMANENT PACEMAKER INSERTION;  Surgeon: Evans Lance, MD;  Location: [facility] CATH LAB;  Service: Cardiovascular;  Laterality: N/A;   Social History:  Social History   Social History Narrative   Retired Pharmacist, community.  Market researcher.  Nondrinker.  Lives alone.  Has adopted son.    Allergies  Allergen Reactions  . Imdur [Isosorbide Mononitrate]     Intolerant and cause headaches and nausea  . Polysporin [Bacitracin-Polymyxin B]     Rash  . Quinine Derivatives     Increase in Heart Rate and Decrease in BP   . Tetracyclines & Related     Rash    Family History  Problem Relation Age  of Onset  . Stroke Mother   . Kidney disease Father   . Lung cancer Brother   . Kidney disease Sister   . Stroke Brother   . Heart disease Brother     x 2  . Liver cancer Sister     Prior to Admission medications   Medication Sig Start Date End Date Taking? Authorizing Provider  Ascorbic Acid (VITAMIN C) 1000 MG tablet Take 1,000 mg by mouth daily.    Yes Historical Provider, MD  BIOTIN PO Take 600 mg by mouth 2 (two) times daily.    Yes Historical Provider, MD  Calcium Carb-Cholecalciferol (CALCIUM 600 + D PO) Take 600 mg by mouth 2 (two) times daily.   Yes Historical Provider, MD  Cholecalciferol (VITAMIN D) 2000 UNITS tablet Take 2,000 Units by mouth daily. VITAMIN D3 2000 MG   Yes Historical Provider, MD  diltiazem (CARDIZEM CD) 180 MG 24 hr capsule Take 180 mg by mouth every morning.    Yes Historical Provider, MD  ELIQUIS 2.5 MG TABS tablet TAKE 1 TABLET TWICE DAILY. 11/28/13  Yes Deboraha Sprang, MD  Multiple Vitamin (MULTIVITAMIN WITH MINERALS) TABS tablet Take 1 tablet by mouth daily.   Yes Historical Provider, MD  OVER THE COUNTER MEDICATION Place 1 drop into both eyes daily. Eye drops for dry eyes   Yes Historical Provider, MD  pantoprazole (PROTONIX) 40 MG tablet Take 40 mg by mouth daily.    Yes Historical Provider, MD  rosuvastatin (CRESTOR) 5 MG tablet Take 5 mg by mouth daily.   Yes Historical Provider, MD   Physical Exam: Filed Vitals:   11/20/14 1127 11/20/14 1127 11/20/14 1208 11/20/14 1241  BP: 138/50 138/50 139/59 155/64  Pulse: 72 78 67 71  Resp: 20 18 21 18   SpO2: 97% 97% 97% 95%    On exam Pleasant oriented no apparent distress No JVD, bruit with radiation of mitral valve prolapse from left lower sternal edge, carotid hypersensitivity not tested S1-S2 no gallop Regular rate rhythm Clinical clear no added sound Abdomen soft nontender nondistended no rebound Reflexes are brisk Power is 5/5 Mouth has laceration about 5 cm long below the right side of the  mouth which has been sutured she has some swelling of the lip as well as loss of upper teeth   Labs on Admission:  Basic Metabolic Panel:  Recent Labs Lab 11/20/14 0947  NA 141  K 4.3  CL 103  CO2 27  GLUCOSE 88  BUN 19  CREATININE 0.81  CALCIUM 9.5   Liver Function Tests: No results for input(s): AST, ALT, ALKPHOS, BILITOT, PROT, ALBUMIN in the last 168 hours.  No results for input(s): LIPASE, AMYLASE in the last 168 hours. No results for input(s): AMMONIA in the last 168 hours. CBC:  Recent Labs Lab 11/20/14 0947  WBC 10.2  NEUTROABS 5.6  HGB 13.5  HCT 41.8  MCV 103.2*  PLT 256   Cardiac Enzymes: No results for input(s): CKTOTAL, CKMB, CKMBINDEX, TROPONINI in the last 168 hours.  BNP (last 3 results) No results for input(s): BNP in the last 8760 hours.  ProBNP (last 3 results) No results for input(s): PROBNP in the last 8760 hours.  CBG: No results for input(s): GLUCAP in the last 168 hours.  Radiological Exams on Admission: Ct Head Wo Contrast  11/20/2014   CLINICAL DATA:  Facial trauma secondary to a fall this morning.  EXAM: CT HEAD WITHOUT CONTRAST  CT MAXILLOFACIAL WITHOUT CONTRAST  TECHNIQUE: Multidetector CT imaging of the head and maxillofacial structures were performed using the standard protocol without intravenous contrast. Multiplanar CT image reconstructions of the maxillofacial structures were also generated.  COMPARISON:  CT scans dated 07/24/2012  FINDINGS: CT HEAD FINDINGS  There is a tiny acute subdural hemorrhage along the left side of the interhemispheric falx adjacent to the left frontal lobe. This measures 11 x 2.6 mm. No mass effect.  Brain parenchyma is otherwise normal. No mass lesions. No infarctions. No osseous abnormality.  CT MAXILLOFACIAL FINDINGS  There are no fractures. There is a radiodense material in what appears to be a laceration of the right side of the lower lip.  No sinus opacification. Severe degenerative arthritis of both  temporomandibular joints with erosions of the mandibular condyles.  Severe left facet arthritis at C1-2. Diffuse degenerative disc disease in the cervical spine with 2 mm spondylolisthesis of C3 on C4. Auto fusion of C4 and C5.  IMPRESSION: 1. Tiny subdural hematoma along the left side of the anterior aspect of the interhemispheric falx. 2. Radiodense material in the laceration of the right side of the lower lip. 3. No facial fractures.   Electronically Signed   By: Lorriane Shire M.D.   On: 11/20/2014 10:18   Dg Chest Port 1 View  11/20/2014   CLINICAL DATA:  Syncopal episode today.  EXAM: PORTABLE CHEST - 1 VIEW  COMPARISON:  01/30/2013  FINDINGS: Stable appearance of the left dual chamber cardiac pacemaker. Heart size is normal. The trachea is midline. Again noted are surgical clips in the right axilla. There is stable sclerosis and mild deformity of the right humeral head. Again noted are degenerative changes at the left glenohumeral joint. Lungs are clear without airspace disease or pulmonary edema.  IMPRESSION: No acute chest abnormality. Stable appearance of the left cardiac pacemaker.   Electronically Signed   By: Markus Daft M.D.   On: 11/20/2014 10:39   Ct Maxillofacial Wo Cm  11/20/2014   CLINICAL DATA:  Facial trauma secondary to a fall this morning.  EXAM: CT HEAD WITHOUT CONTRAST  CT MAXILLOFACIAL WITHOUT CONTRAST  TECHNIQUE: Multidetector CT imaging of the head and maxillofacial structures were performed using the standard protocol without intravenous contrast. Multiplanar CT image reconstructions of the maxillofacial structures were also generated.  COMPARISON:  CT scans dated 07/24/2012  FINDINGS: CT HEAD FINDINGS  There is a tiny acute subdural hemorrhage along the left side of the interhemispheric falx adjacent to the left frontal lobe. This measures 11 x 2.6 mm. No mass effect.  Brain parenchyma is otherwise normal. No mass lesions. No infarctions. No osseous abnormality.  CT MAXILLOFACIAL  FINDINGS  There are  no fractures. There is a radiodense material in what appears to be a laceration of the right side of the lower lip.  No sinus opacification. 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  Severe left facet arthritis at C1-2. Diffuse degenerative disc disease in the cervical spine with 2 mm spondylolisthesis of C3 on C4. Auto fusion of C4 and C5.  IMPRESSION: 1. Tiny subdural hematoma along the left side of the anterior aspect of the interhemispheric falx. 2. Radiodense material in the laceration of the right side of the lower lip. 3. No facial fractures.   Electronically Signed   By: Lorriane Shire M.D.   On: 11/20/2014 10:18    EKG: Independently reviewed. Sinus slight T wve depression non-contig leads  Assessment/Plan Principal Problem:   Syncope and collapse -Etiology unclear but pacemaker is been interrogated -Obtain echocardiogram this admission -Consider carotid duplex if echocardiogram does not show any worsening of MV valve gradient as she does have history of mitral valve prolapse -Etiology could be mitral valve prolapse-we'll discuss with cardiology when they see her    Diastolic dysfunction -Last echocardiogram showed 66 32% grade 2 diastolic dysfunction -PA peak pressure of 34 mmHg, tricuspid PA peak was 29 mmHg -See above discussion    Carotid sinus hypersensitivity I have deferred any further testing of her carotid sinus to cardiology    Pacemaker-St JudesIn the setting of A. Fib -Continue Cardizem 180 daily -As above    Subdural hematoma Neurosurgeon saw the patients scan and recommended repeat CT in a.m. which I will perform -We will hold off on Elliquis for right now and restart tomorrow provided no further enlargement of the subdural hematoma    Basal cell carcinoma -Follow-up as an outpatient with regular physician for screening    Complicated laceration of lip -Repaired by emergency  room. Sutures to come out probably within 10 days  Confirm DO NOT RESUSCITATE at the bedside Long discussion with patient at the bedside and she understands clearly 75 minutes  Verlon Au [facility] Pager [phone]  If 7PM-7AM, please contact night-coverage [email or web address] Password [email or web address] 11/20/2014, 1:30 PM

## 2014-11-20 NOTE — ED Notes (Signed)
MD at bedside. 

## 2014-11-21 ENCOUNTER — Observation Stay (HOSPITAL_BASED_OUTPATIENT_CLINIC_OR_DEPARTMENT_OTHER): Payer: Medicare Other

## 2014-11-21 ENCOUNTER — Encounter (HOSPITAL_COMMUNITY): Payer: Self-pay | Admitting: Radiology

## 2014-11-21 ENCOUNTER — Observation Stay (HOSPITAL_COMMUNITY): Payer: Medicare Other

## 2014-11-21 DIAGNOSIS — S065X0A Traumatic subdural hemorrhage without loss of consciousness, initial encounter: Secondary | ICD-10-CM | POA: Diagnosis not present

## 2014-11-21 DIAGNOSIS — R55 Syncope and collapse: Secondary | ICD-10-CM | POA: Diagnosis not present

## 2014-11-21 LAB — COMPREHENSIVE METABOLIC PANEL
ALBUMIN: 4 g/dL (ref 3.5–5.0)
ALT: 14 U/L (ref 14–54)
AST: 26 U/L (ref 15–41)
Alkaline Phosphatase: 53 U/L (ref 38–126)
Anion gap: 5 (ref 5–15)
BUN: 11 mg/dL (ref 6–20)
CHLORIDE: 106 mmol/L (ref 101–111)
CO2: 30 mmol/L (ref 22–32)
CREATININE: 0.88 mg/dL (ref 0.44–1.00)
Calcium: 8.9 mg/dL (ref 8.9–10.3)
GFR calc non Af Amer: 58 mL/min — ABNORMAL LOW (ref >60)
GLUCOSE: 106 mg/dL — AB (ref 65–99)
Potassium: 4.4 mmol/L (ref 3.5–5.1)
SODIUM: 141 mmol/L (ref 135–145)
Total Bilirubin: 0.5 mg/dL (ref 0.3–1.2)
Total Protein: 6.4 g/dL — ABNORMAL LOW (ref 6.5–8.1)

## 2014-11-21 LAB — CBC
HCT: 40 % (ref 36.0–46.0)
HEMOGLOBIN: 13 g/dL (ref 12.0–15.0)
MCH: 33.2 pg (ref 26.0–34.0)
MCHC: 32.5 g/dL (ref 30.0–36.0)
MCV: 102.3 fL — AB (ref 78.0–100.0)
PLATELETS: 222 10*3/uL (ref 150–400)
RBC: 3.91 MIL/uL (ref 3.87–5.11)
RDW: 12.8 % (ref 11.5–15.5)
WBC: 7.4 10*3/uL (ref 4.0–10.5)

## 2014-11-21 LAB — GLUCOSE, CAPILLARY: Glucose-Capillary: 99 mg/dL (ref 65–99)

## 2014-11-21 MED ORDER — VITAMINS A & D EX OINT
TOPICAL_OINTMENT | CUTANEOUS | Status: AC
Start: 2014-11-21 — End: 2014-11-21
  Administered 2014-11-21: 16:00:00
  Filled 2014-11-21: qty 20

## 2014-11-21 MED ORDER — APIXABAN 2.5 MG PO TABS: 2.5000 mg | ORAL_TABLET | Freq: Two times a day (BID) | ORAL | Status: AC

## 2014-11-21 MED ORDER — DILTIAZEM HCL ER COATED BEADS 180 MG PO CP24: 180.0000 mg | ORAL_CAPSULE | Freq: Every morning | ORAL | Status: AC

## 2014-11-21 NOTE — Progress Notes (Signed)
  Echocardiogram 2D Echocardiogram has been performed.  Lysle Rubens 11/21/2014, 9:58 AM

## 2014-11-21 NOTE — Progress Notes (Signed)
Recommendations from Neurosurgery -follow up with Dr Cyndy Freeze 4 weeks -Rpt CT in about 1 month -Hold Elliquis for 1 week then may resume

## 2014-11-21 NOTE — Consult Note (Signed)
CARDIOLOGY CONSULT NOTE       Patient ID: Joy Newman MRN: [id number] DOB/AGE: 1927-11-07 81 y.o.  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  Occurred after eating.  Was walking and found herself on ground with facial trauma.  Indicates similar episode to her "falls" before pacer placed.  In ER St Jude pacer checked and functioning normally with no mode switches to indicate rapid arrhythmia.  Normal thresholds.  She denied palpitations, dyspnea or chest pain.  Telemetry since admission has been NSR.  Not postural.  Troponin negative  Echo is pending.  She was on Eliquis and CT with small subdural with NS indicating she may continue this.  Has appt with oral surgeon on Monday.    ROS All other systems reviewed and negative except as noted above  Past Medical History  Diagnosis Date  . Hyperlipidemia   . Diastolic dysfunction   . CAD (coronary artery disease) no obstruction at cath     Hx of with an anteroapical M.I  . Pulmonary hypertension      Hx of Mild  . Tricuspid regurgitation     Moderate regurgitation  . Kidney stones   . Arteriovenous malformation of colon   . Diverticulosis   . Carotid sinus hypersensitivity   . Syncope   . Hypertension   . PVC and PACs   . Complete heart block-intermittent   . Pacemaker     St Jude  . MVP (mitral valve prolapse)   . Basal cell carcinoma   . Melanoma   . Squamous carcinoma   . Myocardial infarction     2005  . Orthostasis   . Tubular adenoma of colon 02/2012    Family History  Problem Relation  Age of Onset  . Stroke Mother   . Kidney disease Father   . Lung cancer Brother   . Kidney disease Sister   . Stroke Brother   . Heart disease Brother     x 2  . Liver cancer Sister     Social History   Social History  . Marital Status: Widowed    Spouse Name: N/A  . Number of Children: 0  . Years of Education: N/A   Occupational History  . Retired OR Marine scientist    Social History Main Topics  . Smoking status: Never Smoker   . Smokeless tobacco: Never Used  . Alcohol Use: Yes     Comment: Rarely  . Drug Use: No  . Sexual Activity: Not on file   Other Topics Concern  . Not on file   Social History Narrative   Retired Pharmacist, community.  Market researcher.  Nondrinker.  Lives alone.  Has adopted son.    Past Surgical History  Procedure Laterality Date  . Appendectomy  1954    Hx of  . Tonsillectomy  1971    Hx of  . Abdominal hysterectomy  1980    Hx of  . Cardiac catheterization      Revealed an apical wall motion abnormality  . Cardiovascular stress test  02-21-2006    EF 84%,  which revealed an apical defect  . Nasal reconstruction  1981  . Skin cancer excision      several, basal cell, squamous, melanoma  . Pacemaker insertion    . Permanent pacemaker insertion N/A 04/13/2011    Procedure: PERMANENT PACEMAKER INSERTION;  Surgeon: Evans Lance, MD;  Location: [facility] CATH LAB;  Service: Cardiovascular;  Laterality: N/A;     . amoxicillin-clavulanate  1 tablet Oral Q12H  . diltiazem  180 mg Oral q morning - 10a  . sodium chloride  3 mL Intravenous Q12H      Physical Exam: Blood pressure 123/45, pulse 61, temperature 98.6 F (37 C), temperature source Oral, resp. rate 20, height 5\' 2"  (1.575 m), weight 48.2 kg (106 lb 4.2 oz), SpO2 98 %.    Affect appropriate Elderly white female HEENT: Facial trauma with lip laceration  Neck supple with no adenopathy JVP normal no bruits no thyromegaly Lungs clear with no wheezing and good diaphragmatic motion Heart:  S1/S2 no  murmur, no rub, gallop or click PMI normal Abdomen: benighn, BS positve, no tenderness, no AAA no bruit.  No HSM or HJR Distal pulses intact with no bruits No edema Neuro non-focal Skin warm and dry No muscular weakness   Labs:   Lab Results  Component Value Date   WBC 7.4 11/21/2014   HGB 13.0 11/21/2014   HCT 40.0 11/21/2014   MCV 102.3* 11/21/2014   PLT 222 11/21/2014    Recent Labs Lab 11/21/14 0518  NA 141  K 4.4  CL 106  CO2 30  BUN 11  CREATININE 0.88  CALCIUM 8.9  PROT 6.4*  BILITOT 0.5  ALKPHOS 53  ALT 14  AST 26  GLUCOSE 106*   Lab Results  Component Value Date   CKTOTAL 138 04/12/2011   CKMB 3.2 04/12/2011   TROPONINI <0.30 07/25/2012    Lab Results  Component Value Date   CHOL 210* 11/20/2011   Lab Results  Component Value Date   HDL 108.40 11/20/2011   No results found for: [facility] Lab Results  Component Value Date   TRIG 103.0 11/20/2011   Lab Results  Component Value Date   CHOLHDL 2 11/20/2011   Lab Results  Component Value Date   LDLDIRECT 87.5 11/20/2011      Radiology: Ct Head Wo Contrast  11/20/2014   CLINICAL DATA:  Facial trauma secondary to a fall this morning.  EXAM: CT HEAD WITHOUT CONTRAST  CT MAXILLOFACIAL WITHOUT CONTRAST  TECHNIQUE: Multidetector CT imaging of the head and maxillofacial structures were performed using the standard protocol without intravenous contrast. Multiplanar CT image reconstructions of the maxillofacial structures were also generated.  COMPARISON:  CT scans dated 07/24/2012  FINDINGS: CT HEAD FINDINGS  There is a tiny acute subdural hemorrhage along the left side of the interhemispheric falx adjacent to the left frontal lobe. This measures 11 x 2.6 mm. No mass effect.  Brain parenchyma is otherwise normal. No mass lesions. No infarctions. No osseous abnormality.  CT MAXILLOFACIAL FINDINGS  There are no fractures. There is a radiodense material in what appears to be a laceration of the right side of  the lower lip.  No sinus opacification. 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  Severe left facet arthritis at C1-2. Diffuse degenerative disc disease in the cervical spine with 2 mm spondylolisthesis of C3 on C4. Auto fusion of C4 and C5.  IMPRESSION: 1. Tiny subdural hematoma along the left side of  the anterior aspect of the interhemispheric falx. 2. Radiodense material in the laceration of the right side of the lower lip. 3. No facial fractures.   Electronically Signed   By: Lorriane Shire M.D.   On: 11/20/2014 10:18   Dg Chest Port 1 View  11/20/2014   CLINICAL DATA:  Syncopal episode today.  EXAM: PORTABLE CHEST - 1 VIEW  COMPARISON:  01/30/2013  FINDINGS: Stable appearance of the left dual chamber cardiac pacemaker. Heart size is normal. The trachea is midline. Again noted are surgical clips in the right axilla. There is stable sclerosis and mild deformity of the right humeral head. Again noted are degenerative changes at the left glenohumeral joint. Lungs are clear without airspace disease or pulmonary edema.  IMPRESSION: No acute chest abnormality. Stable appearance of the left cardiac pacemaker.   Electronically Signed   By: Markus Daft M.D.   On: 11/20/2014 10:39   Ct Maxillofacial Wo Cm  11/20/2014   CLINICAL DATA:  Facial trauma secondary to a fall this morning.  EXAM: CT HEAD WITHOUT CONTRAST  CT MAXILLOFACIAL WITHOUT CONTRAST  TECHNIQUE: Multidetector CT imaging of the head and maxillofacial structures were performed using the standard protocol without intravenous contrast. Multiplanar CT image reconstructions of the maxillofacial structures were also generated.  COMPARISON:  CT scans dated 07/24/2012  FINDINGS: CT HEAD FINDINGS  There is a tiny acute subdural hemorrhage along the left side of the interhemispheric falx adjacent to the left frontal lobe. This measures 11 x 2.6 mm. No mass effect.  Brain parenchyma is otherwise  normal. No mass lesions. No infarctions. No osseous abnormality.  CT MAXILLOFACIAL FINDINGS  There are no fractures. There is a radiodense material in what appears to be a laceration of the right side of the lower lip.  No sinus opacification. 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  Severe left facet arthritis at C1-2. Diffuse degenerative disc disease in the cervical spine with 2 mm spondylolisthesis of C3 on C4. Auto fusion of C4 and C5.  IMPRESSION: 1. Tiny subdural hematoma along the left side of the anterior aspect of the interhemispheric falx. 2. Radiodense material in the laceration of the right side of the lower lip. 3. No facial fractures.   Electronically Signed   By: Lorriane Shire M.D.   On: 11/20/2014 10:18    EKG:  SR LVH PVC no acute changes  Telemetry :  NSR no arrhythmia  11/21/2014    ASSESSMENT AND PLAN:  Syncope:  No obvious cardiac etiology.  Ok to d/c home for oral surgery f/u on Monday if echo shows normal EF.  PE low likelyhood on Eliquis.  Likely transient postural drop on BP with mechanical fall given age.  Pacer helps a lot as with normal function this was not a bradyarrhythmia and sensing modes do not indicate any high HR mode switches.    Subdural:  May need f/u CT scan on Eliquis has been seen by NS  Trauma: Has appt with oral surgeon on Monday  Signed: Jenkins Rouge 11/21/2014, 8:06 AM

## 2014-11-27 DIAGNOSIS — R55 Syncope and collapse: Secondary | ICD-10-CM | POA: Diagnosis not present

## 2014-11-27 DIAGNOSIS — I951 Orthostatic hypotension: Secondary | ICD-10-CM | POA: Diagnosis not present

## 2014-11-27 DIAGNOSIS — Z23 Encounter for immunization: Secondary | ICD-10-CM | POA: Diagnosis not present

## 2014-11-27 DIAGNOSIS — Z950 Presence of cardiac pacemaker: Secondary | ICD-10-CM | POA: Diagnosis not present

## 2014-11-27 DIAGNOSIS — K921 Melena: Secondary | ICD-10-CM | POA: Diagnosis not present

## 2014-11-27 DIAGNOSIS — I480 Paroxysmal atrial fibrillation: Secondary | ICD-10-CM | POA: Diagnosis not present

## 2014-11-27 DIAGNOSIS — Z9889 Other specified postprocedural states: Secondary | ICD-10-CM | POA: Diagnosis not present

## 2014-11-27 DIAGNOSIS — Z1389 Encounter for screening for other disorder: Secondary | ICD-10-CM | POA: Diagnosis not present

## 2014-11-30 ENCOUNTER — Ambulatory Visit (INDEPENDENT_AMBULATORY_CARE_PROVIDER_SITE_OTHER): Payer: Medicare Other | Admitting: Internal Medicine

## 2014-11-30 ENCOUNTER — Encounter: Payer: Self-pay | Admitting: Internal Medicine

## 2014-11-30 VITALS — BP 138/72 | HR 60 | Ht 62.0 in | Wt 113.8 lb

## 2014-11-30 DIAGNOSIS — R55 Syncope and collapse: Secondary | ICD-10-CM

## 2014-11-30 DIAGNOSIS — Z950 Presence of cardiac pacemaker: Secondary | ICD-10-CM | POA: Diagnosis not present

## 2014-11-30 DIAGNOSIS — Z45018 Encounter for adjustment and management of other part of cardiac pacemaker: Secondary | ICD-10-CM

## 2014-11-30 DIAGNOSIS — I442 Atrioventricular block, complete: Secondary | ICD-10-CM

## 2014-11-30 NOTE — Progress Notes (Signed)
Patient Care Team: Drenda Freeze, MD as PCP - General (Internal Medicine)   HPI  Joy Newman is a 81 y.o. female Seen in followup for pacemaker implantation January 13 for syncope and intermittent complete heart block. She had carotid sinus hypersensitivity and antecedent PR prolongation suggestive of a neurally mediated trigger. His history of myocardial infarction and ischemic heart disease.  Echo 2013 demonstrated normal left ventricular function  Doing well without recurrent syncope. She denies chest pain or shortness of breath  She has some paroxysms of atrial fibrillation. She is on low-dose apixaban; this was held for one week following her fall (see below)  recenly hospitalized with fall and subdural hematoma, records reviewed  Orthostatic VS incomplete but neg   Device not interrogated as best as I can tell but shows no arrhjythmia, and still with issues of OVERSENSING of A  And V  Leads-simultaneously  Past Medical History  Diagnosis Date  . Hyperlipidemia   . Diastolic dysfunction   . CAD (coronary artery disease) no obstruction at cath     Hx of with an anteroapical M.I  . Pulmonary hypertension      Hx of Mild  . Tricuspid regurgitation     Moderate regurgitation  . Kidney stones   . Arteriovenous malformation of colon   . Diverticulosis   . Carotid sinus hypersensitivity   . Syncope   . Hypertension   . PVC and PACs   . Complete heart block-intermittent   . Pacemaker     St Jude  . MVP (mitral valve prolapse)   . Basal cell carcinoma   . Melanoma   . Squamous carcinoma   . Myocardial infarction     2005  . Orthostasis   . Tubular adenoma of colon 02/2012    Past Surgical History  Procedure Laterality Date  . Appendectomy  1954    Hx of  . Tonsillectomy  1971    Hx of  . Abdominal hysterectomy  1980    Hx of  . Cardiac catheterization      Revealed an apical wall motion abnormality  . Cardiovascular stress test  02-21-2006   EF 84%, which revealed an apical defect  . Nasal reconstruction  1981  . Skin cancer excision      several, basal cell, squamous, melanoma  . Pacemaker insertion    . Permanent pacemaker insertion N/A 04/13/2011    Procedure: PERMANENT PACEMAKER INSERTION;  Surgeon: Evans Lance, MD;  Location: [facility] CATH LAB;  Service: Cardiovascular;  Laterality: N/A;    Current Outpatient Prescriptions  Medication Sig Dispense Refill  . apixaban (ELIQUIS) 2.5 MG TABS tablet Take 1 tablet (2.5 mg total) by mouth 2 (two) times daily. Re-tart this medicine on 11/27/14 60 tablet 11  . Ascorbic Acid (VITAMIN C) 1000 MG tablet Take 1,000 mg by mouth daily.     Marland Kitchen BIOTIN PO Take 600 mg by mouth 2 (two) times daily.     . Calcium Carb-Cholecalciferol (CALCIUM 600 + D PO) Take 600 mg by mouth 2 (two) times daily.    . Cholecalciferol (VITAMIN D) 2000 UNITS tablet Take 2,000 Units by mouth daily. VITAMIN D3 2000 MG    . diltiazem (CARDIZEM CD) 180 MG 24 hr capsule Take 1 capsule (180 mg total) by mouth every morning.    . Multiple Vitamin (MULTIVITAMIN WITH MINERALS) TABS tablet Take 1 tablet by mouth daily.    Marland Kitchen OVER THE COUNTER MEDICATION Place 1 drop into both  eyes daily. OTC eye drops for dry eyes    . pantoprazole (PROTONIX) 40 MG tablet Take 40 mg by mouth daily.     . rosuvastatin (CRESTOR) 5 MG tablet Take 5 mg by mouth daily.     No current facility-administered medications for this visit.    Allergies  Allergen Reactions  . Imdur [Isosorbide Mononitrate]     Intolerant and cause headaches and nausea  . Polysporin [Bacitracin-Polymyxin B]     Rash  . Quinine Derivatives     Increase in Heart Rate and Decrease in BP   . Tetracyclines & Related     Rash    Review of Systems negative except from HPI and PMH  Physical Exam BP 138/72 mmHg  Pulse 60  Ht 5\' 2"  (1.575 m)  Wt 113 lb 12.8 oz (51.619 kg)  BMI 20.81 kg/m2 Well developed and well nourished in no acute distress HENT normal; she is  missing her front teeth E scleral and icterus clear Neck Supple JVP flat; carotids brisk and full Device pocket well healed; without hematoma or erythema.  There is no tethering  Clear to ausculation  Regular rate and rhythm, no murmurs gallops or rub Soft with active bowel sounds No clubbing cyanosis  Edema Alert and oriented, grossly normal motor and sensory function Skin Warm and Dry  ECG 8/19 demonstrated sinus rhythm at 85 Intervals 18/09/35 rare PVCs   Assessment and  Plan  Atrial fibrillation  Pacemaker-St. Jude The patient's device was interrogated.  The information was reviewed. No changes were made in the programming.    Syncope-carotid sinus hypersensitivity  Intermittent complete heart block  Nose reversion-atrial/ventricular lead  Fall-traumatic  Is not clear as to the mechanism of her fall. Based on her history includes a trip. Based on her history of carotid sinus hypersensitivity could have been vasodepression.. The fact that she has no recollection of the preceding 100 yards can either be retrograde amnesia or hypotension  In the event that with the latter, potentially CLS pacing might minimize the likelihood of recurrence.  We discussed these options extensively. At this point she is not sure what she would like to do.  There is also an issue of noise occurring simultaneously on her  atrial and ventricular leads mostly at night. She does not have any electrical devices on ielectric blankets at night. This raises the concern about a header issue. We will discuss this with St. Jude; this may prompt device replacement  She's been clear to resume apixaban. She has neurosurgical follow-up. Reminded that driving is precluded for 6 months.

## 2014-11-30 NOTE — Patient Instructions (Signed)
Medication Instructions:  Your physician recommends that you continue on your current medications as directed. Please refer to the Current Medication list given to you today.   Labwork: NONE  Testing/Procedures: NONE  Follow-Up: Your physician recommends that you schedule a follow-up appointment in: 8 weeks with Dr. Caryl Comes.   Any Other Special Instructions Will Be Listed Below (If Applicable).

## 2014-12-01 LAB — CUP PACEART INCLINIC DEVICE CHECK
Battery Voltage: 2.95 V
Date Time Interrogation Session: 20160829120349
Lead Channel Impedance Value: 400 Ohm
Lead Channel Impedance Value: 412.5 Ohm
Lead Channel Pacing Threshold Pulse Width: 0.4 ms
Lead Channel Sensing Intrinsic Amplitude: 4 mV
Lead Channel Setting Pacing Amplitude: 2 V
Lead Channel Setting Pacing Amplitude: 2.5 V
Lead Channel Setting Sensing Sensitivity: 4.5 mV
MDC IDC MSMT BATTERY REMAINING LONGEVITY: 122.4 mo
MDC IDC MSMT LEADCHNL RA PACING THRESHOLD AMPLITUDE: 0.75 V
MDC IDC MSMT LEADCHNL RA PACING THRESHOLD PULSEWIDTH: 0.4 ms
MDC IDC MSMT LEADCHNL RV PACING THRESHOLD AMPLITUDE: 1.25 V
MDC IDC MSMT LEADCHNL RV SENSING INTR AMPL: 12 mV
MDC IDC SET LEADCHNL RV PACING PULSEWIDTH: 0.4 ms
MDC IDC STAT BRADY RA PERCENT PACED: 24 %
MDC IDC STAT BRADY RV PERCENT PACED: 6.5 %
Pulse Gen Model: 2210
Pulse Gen Serial Number: 7303108

## 2014-12-15 NOTE — Discharge Summary (Addendum)
Physician Discharge Summary  Joy Newman ZDG:[id number] DOB: 1928/03/22 DOA: 11/20/2014  PCP: Haywood Pao, MD  Admit date: 11/20/2014 Discharge date: 12/15/2014  Time spent: 20 minutes  Recommendations for Outpatient Follow-up:  1. Needs close follow up with Dr. Caryl Comes of EP 2. No Anticoagulation for ~ 1 week after fall 3. recommend close follow up Dr. Cyndy Freeze NS regarding SDH as per his instruction and he will arrange appt  4. Consider labs within 1 weel  Discharge Diagnoses:  Principal Problem:   Syncope and collapse Active Problems:   Diastolic dysfunction   Mitral valve prolapse   Carotid sinus hypersensitivity   PVC and PACs   Pacemaker-St Judes   Subdural hematoma   Basal cell carcinoma   Complicated laceration of lip   Discharge Condition: good-no driving recommended till seen by Dr. 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 Viann Shove Jude placed 04/2011 Sessile Polyp 11 mm Asxc colo, 6 m hepflex, 5 mm sig colo Bilat ICA stenosis 40-59% back in maybe 2011 without repeat Cardiac Cath 11/2002=Apical akinesis-felt 2/2 to spasm BI-RADS 2 2003   She had a fall maybe on 11/13/14 that was nonproductive. She has had no chest pain. On the morning of admission 8/19 she awoke at around 7:30 had a heavy breakfast Gatorade, almonds, peanuts as well as a waff and started walking outside. Typically she is able to walk after her pacemaker procedure about 2-3 miles and at some point in the past was able to do 10,000 steps prior to her pacemaker surgery.  2/2 to fall patient was admitted for syncope and subdural hemorrhage on Ct head while on elliquis and observed overnight A CT head was performed which  was non-revelatoy day subsequent to admission and did not show any further bleed i spoke with Dr. Kandis Nab personally who recommended follow up in his office which he will arrange Orthostatic vitals were performed which were neg It was felt patient could non-emergently be seen by EP as per Dr. Johnsie Cancel of cardiology saw the patient    Discharge Exam: Filed Vitals:   11/21/14 0528  BP: 123/45  Pulse: 61  Temp: 98.6 F (37 C)  Resp: 20    General: EOMI ncat Cardiovascular: s1 s 2no m/r/g Respiratory: clear no added sound  Discharge Instructions   Discharge Instructions    Diet - low sodium heart healthy    Complete by:  As directed      Discharge instructions    Complete by:  As directed   Follow up with Neurosurgeon in 1 month-call their offcie to set iup appt Hold elliquis for 1 week until 11/27/14 Go see Dr. Caryl Comes as OP in 1 week-he will discuss with you options re: carotid sinus hypersensitivity or lowering your dose of diltiazem     Increase activity slowly    Complete by:  As directed           Discharge Medication List as of 11/21/2014  2:44 PM    CONTINUE these medications which have CHANGED   Details  apixaban (ELIQUIS) 2.5 MG TABS tablet Take 1 tablet (2.5 mg total) by mouth 2 (two) times daily. Re-tart this medicine on 11/27/14, Starting  11/21/2014, Until Discontinued, No Print    diltiazem (CARDIZEM CD) 180 MG 24 hr capsule Take 1 capsule (180 mg total) by mouth every morning., Starting 11/21/2014, Until Discontinued, No Print      CONTINUE these medications which have NOT CHANGED   Details  Ascorbic Acid (VITAMIN C) 1000 MG tablet Take 1,000 mg by mouth daily. , Until Discontinued, Historical Med    BIOTIN PO Take 600 mg by mouth 2 (two) times daily. , Until Discontinued, Historical Med    Calcium Carb-Cholecalciferol (CALCIUM 600 + D PO) Take 600 mg by mouth 2 (two) times daily., Until Discontinued, Historical Med    Cholecalciferol (VITAMIN D) 2000 UNITS  tablet Take 2,000 Units by mouth daily. VITAMIN D3 2000 MG, Until Discontinued, Historical Med    Multiple Vitamin (MULTIVITAMIN WITH MINERALS) TABS tablet Take 1 tablet by mouth daily., Until Discontinued, Historical Med    OVER THE COUNTER MEDICATION Place 1 drop into both eyes daily. Eye drops for dry eyes, Until Discontinued, Historical Med    pantoprazole (PROTONIX) 40 MG tablet Take 40 mg by mouth daily. , Until Discontinued, Historical Med    rosuvastatin (CRESTOR) 5 MG tablet Take 5 mg by mouth daily., Until Discontinued, Historical Med       Allergies  Allergen Reactions  . Imdur [Isosorbide Mononitrate]     Intolerant and cause headaches and nausea  . Polysporin [Bacitracin-Polymyxin B]     Rash  . Quinine Derivatives     Increase in Heart Rate and Decrease in BP   . Tetracyclines & Related     Rash   Follow-up Information    Follow up with Kevan Ny Ditty, MD. Schedule an appointment as soon as possible for a visit in 1 month.   Specialty:  Neurosurgery   Contact information:   [place] [address] [phone]       Follow up with Virl Axe, MD. Schedule an appointment as soon as possible for a visit in 1 week.   Specialty:  Cardiology   Contact information:   [address] [place] [address] [phone]        The results of significant diagnostics from this hospitalization (including imaging, microbiology, ancillary and laboratory) are listed below for reference.    Significant Diagnostic Studies: Ct Head Wo Contrast  11/21/2014   CLINICAL DATA:  Recent fall with facial trauma  EXAM: CT HEAD WITHOUT CONTRAST  TECHNIQUE: Contiguous axial images were obtained from the base of the skull through the vertex without intravenous contrast.  COMPARISON:  11/20/2014  FINDINGS: Bony calvarium is intact. The previously seen small subdural hematoma along the falx on the left is again identified. It measures  approximately 4 mm by 6 mm. It appears slightly more prominent than that seen on the prior exam. No new focal areas of hemorrhage are seen. The remainder the exam is stable. The  IMPRESSION: Slight increase in size of small left subdural hematoma measuring 4 x 6 mm. Continued followup is recommended.   Electronically Signed   By: Inez Catalina M.D.   On: 11/21/2014 11:36   Ct Head Wo Contrast  11/20/2014   CLINICAL DATA:  Facial trauma secondary to a fall this morning.  EXAM: CT HEAD WITHOUT CONTRAST  CT MAXILLOFACIAL WITHOUT CONTRAST  TECHNIQUE: Multidetector CT imaging of the head and maxillofacial structures were performed using the standard protocol without intravenous contrast. Multiplanar CT image reconstructions of the maxillofacial structures were also  generated.  COMPARISON:  CT scans dated 07/24/2012  FINDINGS: CT HEAD FINDINGS  There is a tiny acute subdural hemorrhage along the left side of the interhemispheric falx adjacent to the left frontal lobe. This measures 11 x 2.6 mm. No mass effect.  Brain parenchyma is otherwise normal. No mass lesions. No infarctions. No osseous abnormality.  CT MAXILLOFACIAL FINDINGS  There are no fractures. There is a radiodense material in what appears to be a laceration of the right side of the lower lip.  No sinus opacification. 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  Severe left facet arthritis at C1-2. Diffuse degenerative disc disease in the cervical spine with 2 mm spondylolisthesis of C3 on C4. Auto fusion of C4 and C5.  IMPRESSION: 1. Tiny subdural hematoma along the left side of the anterior aspect of the interhemispheric falx. 2. Radiodense material in the laceration of the right side of the lower lip. 3. No facial fractures.   Electronically Signed   By: Lorriane Shire M.D.   On: 11/20/2014 10:18   Dg Chest Port 1 View  11/20/2014   CLINICAL DATA:  Syncopal episode today.  EXAM: PORTABLE  CHEST - 1 VIEW  COMPARISON:  01/30/2013  FINDINGS: Stable appearance of the left dual chamber cardiac pacemaker. Heart size is normal. The trachea is midline. Again noted are surgical clips in the right axilla. There is stable sclerosis and mild deformity of the right humeral head. Again noted are degenerative changes at the left glenohumeral joint. Lungs are clear without airspace disease or pulmonary edema.  IMPRESSION: No acute chest abnormality. Stable appearance of the left cardiac pacemaker.   Electronically Signed   By: Markus Daft M.D.   On: 11/20/2014 10:39   Ct Maxillofacial Wo Cm  11/20/2014   CLINICAL DATA:  Facial trauma secondary to a fall this morning.  EXAM: CT HEAD WITHOUT CONTRAST  CT MAXILLOFACIAL WITHOUT CONTRAST  TECHNIQUE: Multidetector CT imaging of the head and maxillofacial structures were performed using the standard protocol without intravenous contrast. Multiplanar CT image reconstructions of the maxillofacial structures were also generated.  COMPARISON:  CT scans dated 07/24/2012  FINDINGS: CT HEAD FINDINGS  There is a tiny acute subdural hemorrhage along the left side of the interhemispheric falx adjacent to the left frontal lobe. This measures 11 x 2.6 mm. No mass effect.  Brain parenchyma is otherwise normal. No mass lesions. No infarctions. No osseous abnormality.  CT MAXILLOFACIAL FINDINGS  There are no fractures. There is a radiodense material in what appears to be a laceration of the right side of the lower lip.  No sinus opacification. 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  Severe left facet arthritis at C1-2. Diffuse degenerative disc disease in the cervical spine with 2 mm spondylolisthesis of C3 on C4. Auto fusion of C4 and C5.  IMPRESSION: 1. Tiny subdural hematoma along the left side of the anterior aspect of the interhemispheric falx. 2. Radiodense material in the laceration of the right side of the lower  lip. 3. No facial fractures.   Electronically Signed   By: Lorriane Shire M.D.   On: 11/20/2014 10:18    Microbiology: No results found for this or any previous visit (from the past 240 hour(s)).   Labs: Basic Metabolic Panel: No results for input(s): NA, K, CL, CO2, GLUCOSE, BUN, CREATININE, CALCIUM, MG, PHOS in the last 168 hours. Liver Function Tests: No results for input(s):  AST, ALT, ALKPHOS, BILITOT, PROT, ALBUMIN in the last 168 hours. No results for input(s): LIPASE, AMYLASE in the last 168 hours. No results for input(s): AMMONIA in the last 168 hours. CBC: No results for input(s): WBC, NEUTROABS, HGB, HCT, MCV, PLT in the last 168 hours. Cardiac Enzymes: No results for input(s): CKTOTAL, CKMB, CKMBINDEX, TROPONINI in the last 168 hours. BNP: BNP (last 3 results) No results for input(s): BNP in the last 8760 hours.  ProBNP (last 3 results) No results for input(s): PROBNP in the last 8760 hours.  CBG: No results for input(s): GLUCAP in the last 168 hours.     SignedNita Sells  [facility] 12/15/2014, 10:33 AM

## 2014-12-18 DIAGNOSIS — I10 Essential (primary) hypertension: Secondary | ICD-10-CM | POA: Diagnosis not present

## 2014-12-18 DIAGNOSIS — I6200 Nontraumatic subdural hemorrhage, unspecified: Secondary | ICD-10-CM | POA: Diagnosis not present

## 2015-01-20 DIAGNOSIS — E785 Hyperlipidemia, unspecified: Secondary | ICD-10-CM | POA: Diagnosis not present

## 2015-01-20 DIAGNOSIS — I10 Essential (primary) hypertension: Secondary | ICD-10-CM | POA: Diagnosis not present

## 2015-01-20 DIAGNOSIS — R8290 Unspecified abnormal findings in urine: Secondary | ICD-10-CM | POA: Diagnosis not present

## 2015-01-20 DIAGNOSIS — E559 Vitamin D deficiency, unspecified: Secondary | ICD-10-CM | POA: Diagnosis not present

## 2015-01-27 DIAGNOSIS — I951 Orthostatic hypotension: Secondary | ICD-10-CM | POA: Diagnosis not present

## 2015-01-27 DIAGNOSIS — K2950 Unspecified chronic gastritis without bleeding: Secondary | ICD-10-CM | POA: Diagnosis not present

## 2015-01-27 DIAGNOSIS — Z1389 Encounter for screening for other disorder: Secondary | ICD-10-CM | POA: Diagnosis not present

## 2015-01-27 DIAGNOSIS — Z950 Presence of cardiac pacemaker: Secondary | ICD-10-CM | POA: Diagnosis not present

## 2015-01-27 DIAGNOSIS — D126 Benign neoplasm of colon, unspecified: Secondary | ICD-10-CM | POA: Diagnosis not present

## 2015-01-27 DIAGNOSIS — E785 Hyperlipidemia, unspecified: Secondary | ICD-10-CM | POA: Diagnosis not present

## 2015-01-27 DIAGNOSIS — N183 Chronic kidney disease, stage 3 (moderate): Secondary | ICD-10-CM | POA: Diagnosis not present

## 2015-01-27 DIAGNOSIS — Z0000 Encounter for general adult medical examination without abnormal findings: Secondary | ICD-10-CM | POA: Diagnosis not present

## 2015-01-27 DIAGNOSIS — Z6820 Body mass index (BMI) 20.0-20.9, adult: Secondary | ICD-10-CM | POA: Diagnosis not present

## 2015-01-27 DIAGNOSIS — I341 Nonrheumatic mitral (valve) prolapse: Secondary | ICD-10-CM | POA: Diagnosis not present

## 2015-01-27 DIAGNOSIS — M810 Age-related osteoporosis without current pathological fracture: Secondary | ICD-10-CM | POA: Diagnosis not present

## 2015-01-27 DIAGNOSIS — I10 Essential (primary) hypertension: Secondary | ICD-10-CM | POA: Diagnosis not present

## 2015-02-02 ENCOUNTER — Ambulatory Visit (INDEPENDENT_AMBULATORY_CARE_PROVIDER_SITE_OTHER): Payer: Medicare Other | Admitting: Internal Medicine

## 2015-02-02 ENCOUNTER — Encounter: Payer: Self-pay | Admitting: Internal Medicine

## 2015-02-02 VITALS — BP 114/80 | HR 73 | Ht 62.0 in | Wt 114.6 lb

## 2015-02-02 DIAGNOSIS — I442 Atrioventricular block, complete: Secondary | ICD-10-CM

## 2015-02-02 DIAGNOSIS — Z950 Presence of cardiac pacemaker: Secondary | ICD-10-CM | POA: Diagnosis not present

## 2015-02-02 NOTE — Progress Notes (Signed)
Patient Care Team: Drenda Freeze, MD as PCP - General (Internal Medicine)   HPI  Joy Newman is a 81 y.o. female Seen in followup for pacemaker implantation January 13 for syncope and intermittent complete heart block. She had carotid sinus hypersensitivity and antecedent PR prolongation suggestive of a neurally mediated trigger. His history of myocardial infarction and ischemic heart disease.  Echo 2013 demonstrated normal left ventricular function   Was at. She denies chest pain or shortness of breath  She has some paroxysms of atrial fibrillation. She is on low-dose apixaban; this was held for one week following her fall (see below)  recenly hospitalized with fall and subdural hematoma, records reviewed  Orthostatic VS incomplete but neg   Device not interrogated as best as I can tell but shows no arrhjythmia, and still with issues of OVERSENSING of A  And V  Leads-simultaneously   She has had no recurrent synocpe  she has had some palpitations.  Past Medical History  Diagnosis Date  . Hyperlipidemia   . Diastolic dysfunction   . CAD (coronary artery disease) no obstruction at cath     Hx of with an anteroapical M.I  . Pulmonary hypertension (HCC)      Hx of Mild  . Tricuspid regurgitation     Moderate regurgitation  . Kidney stones   . Arteriovenous malformation of colon   . Diverticulosis   . Carotid sinus hypersensitivity   . Syncope   . Hypertension   . PVC and PACs   . Complete heart block-intermittent   . Pacemaker     St Jude  . MVP (mitral valve prolapse)   . Basal cell carcinoma   . Melanoma ([place])   . Squamous carcinoma ([place])   . Myocardial infarction ([place])     2005  . Orthostasis   . Tubular adenoma of colon 02/2012    Past Surgical History  Procedure Laterality Date  . Appendectomy  1954    Hx of  . Tonsillectomy  1971    Hx of  . Abdominal hysterectomy  1980    Hx of  . Cardiac catheterization      Revealed an apical wall  motion abnormality  . Cardiovascular stress test  02-21-2006    EF 84%, which revealed an apical defect  . Nasal reconstruction  1981  . Skin cancer excision      several, basal cell, squamous, melanoma  . Pacemaker insertion    . Permanent pacemaker insertion N/A 04/13/2011    Procedure: PERMANENT PACEMAKER INSERTION;  Surgeon: Evans Lance, MD;  Location: [facility] CATH LAB;  Service: Cardiovascular;  Laterality: N/A;    Current Outpatient Prescriptions  Medication Sig Dispense Refill  . apixaban (ELIQUIS) 2.5 MG TABS tablet Take 1 tablet (2.5 mg total) by mouth 2 (two) times daily. Re-tart this medicine on 11/27/14 60 tablet 11  . Ascorbic Acid (VITAMIN C) 1000 MG tablet Take 1,000 mg by mouth daily.     Marland Kitchen BIOTIN PO Take 600 mg by mouth 2 (two) times daily.     . Calcium Carb-Cholecalciferol (CALCIUM 600 + D PO) Take 600 mg by mouth 2 (two) times daily.    . Cholecalciferol (VITAMIN D) 2000 UNITS tablet Take 2,000 Units by mouth daily. VITAMIN D3 2000 MG    . diltiazem (CARDIZEM CD) 180 MG 24 hr capsule Take 1 capsule (180 mg total) by mouth every morning.    . Multiple Vitamin (MULTIVITAMIN WITH MINERALS) TABS tablet Take  1 tablet by mouth daily.    Marland Kitchen OVER THE COUNTER MEDICATION Place 1 drop into both eyes daily. OTC eye drops for dry eyes    . pantoprazole (PROTONIX) 40 MG tablet Take 40 mg by mouth daily.     . rosuvastatin (CRESTOR) 5 MG tablet Take 5 mg by mouth daily.     No current facility-administered medications for this visit.    Allergies  Allergen Reactions  . Imdur [Isosorbide Mononitrate]     Intolerant and cause headaches and nausea  . Polysporin [Bacitracin-Polymyxin B]     Rash  . Quinine Derivatives     Increase in Heart Rate and Decrease in BP   . Tetracyclines & Related     Rash    Review of Systems negative except from HPI and PMH  Physical Exam BP 114/80 mmHg  Pulse 73  Ht 5\' 2"  (1.575 m)  Wt 114 lb 9.6 oz (51.982 kg)  BMI 20.96 kg/m2 Well developed  and well nourished in no acute distress HENT normal; she is missing her front teeth E scleral and icterus clear Neck Supple JVP flat; carotids brisk and full Device pocket well healed; without hematoma or erythema.  There is no tethering  Clear to ausculation  Regular rate and rhythm, no murmurs gallops or rub Soft with active bowel sounds No clubbing cyanosis  Edema Alert and oriented, grossly normal motor and sensory function Skin Warm and Dry  ECG 8/19 demonstrated sinus rhythm at 73 Intervals 21/08/40 RSR prime Nonspecific ST segment flattening   Assessment and  Plan  Atrial fibrillation-paroxysmal  Pacemaker-St. Jude The patient's device was interrogated.  The information was reviewed. No changes were made in the programming.    Syncope-carotid sinus hypersensitivity  Intermittent complete heart block  Nose reversion-atrial/ventricular lead  The patient continues to have episodes of noise on the atrial lead which is now reproducible with manipulation. Unfortunately unipolar pacing did not obviate suggesting that there is a through and through lead fracture in the atrial lead.  Thankfully however, the ventricular lead is functioning normally. Her problem is intermittent complete heart block and so we will not pursue replacement. 1 hypothesis for palpitations was asynchronous ventricular pacing; this was not supported during testing.  We will continue the current plan

## 2015-02-02 NOTE — Patient Instructions (Signed)
Medication Instructions: - no changes  Labwork: - none  Procedures/Testing: - none  Follow-Up: - Remote monitoring is used to monitor your Pacemaker of ICD from home. This monitoring reduces the number of office visits required to check your device to one time per year. It allows [place] to keep an eye on the functioning of your device to ensure it is working properly. You are scheduled for a device check from home on 05/04/15. You may send your transmission at any time that day. If you have a wireless device, the transmission will be sent automatically. After your physician reviews your transmission, you will receive a postcard with your next transmission date.  - Your physician wants you to follow-up in: 6 months with Dr. Caryl Comes. You will receive a reminder letter in the mail two months in advance. If you don't receive a letter, please call our office to schedule the follow-up appointment.  Any Additional Special Instructions Will Be Listed Below (If Applicable).

## 2015-02-05 LAB — CUP PACEART INCLINIC DEVICE CHECK
Battery Remaining Longevity: 124.8
Brady Statistic RV Percent Paced: 3.4 %
Implantable Lead Implant Date: 20130110
Implantable Lead Location: 753859
Lead Channel Pacing Threshold Amplitude: 0.75 V
Lead Channel Pacing Threshold Amplitude: 1.25 V
Lead Channel Pacing Threshold Pulse Width: 0.4 ms
Lead Channel Pacing Threshold Pulse Width: 0.4 ms
Lead Channel Sensing Intrinsic Amplitude: 12 mV
Lead Channel Setting Pacing Amplitude: 2 V
Lead Channel Setting Pacing Amplitude: 2.5 V
Lead Channel Setting Sensing Sensitivity: 4.5 mV
MDC IDC LEAD IMPLANT DT: 20130110
MDC IDC LEAD LOCATION: 753860
MDC IDC MSMT BATTERY VOLTAGE: 2.95 V
MDC IDC MSMT LEADCHNL RA IMPEDANCE VALUE: 375 Ohm
MDC IDC MSMT LEADCHNL RA SENSING INTR AMPL: 3.3 mV
MDC IDC MSMT LEADCHNL RV IMPEDANCE VALUE: 425 Ohm
MDC IDC PG SERIAL: 7303108
MDC IDC SESS DTM: 20161101185720
MDC IDC SET LEADCHNL RV PACING PULSEWIDTH: 0.4 ms
MDC IDC STAT BRADY RA PERCENT PACED: 19 %

## 2015-04-26 DIAGNOSIS — L821 Other seborrheic keratosis: Secondary | ICD-10-CM | POA: Diagnosis not present

## 2015-04-26 DIAGNOSIS — Z85828 Personal history of other malignant neoplasm of skin: Secondary | ICD-10-CM | POA: Diagnosis not present

## 2015-04-26 DIAGNOSIS — Z85820 Personal history of malignant melanoma of skin: Secondary | ICD-10-CM | POA: Diagnosis not present

## 2015-05-04 ENCOUNTER — Ambulatory Visit (INDEPENDENT_AMBULATORY_CARE_PROVIDER_SITE_OTHER): Payer: Medicare Other | Admitting: *Deleted

## 2015-05-04 DIAGNOSIS — I442 Atrioventricular block, complete: Secondary | ICD-10-CM

## 2015-05-04 NOTE — Progress Notes (Signed)
Remote pacemaker transmission.   

## 2015-05-24 LAB — CUP PACEART REMOTE DEVICE CHECK
Battery Remaining Longevity: 100 mo
Battery Remaining Percentage: 81 %
Battery Voltage: 2.93 V
Brady Statistic AP VS Percent: 20 %
Brady Statistic RV Percent Paced: 2.8 %
Implantable Lead Implant Date: 20130110
Implantable Lead Implant Date: 20130110
Implantable Lead Location: 753860
Lead Channel Impedance Value: 400 Ohm
Lead Channel Pacing Threshold Amplitude: 0.75 V
Lead Channel Pacing Threshold Amplitude: 1.25 V
Lead Channel Pacing Threshold Pulse Width: 0.4 ms
Lead Channel Sensing Intrinsic Amplitude: 2.8 mV
Lead Channel Setting Sensing Sensitivity: 4.5 mV
MDC IDC LEAD LOCATION: 753859
MDC IDC MSMT LEADCHNL RV IMPEDANCE VALUE: 410 Ohm
MDC IDC MSMT LEADCHNL RV PACING THRESHOLD PULSEWIDTH: 0.4 ms
MDC IDC MSMT LEADCHNL RV SENSING INTR AMPL: 12 mV
MDC IDC PG SERIAL: 7303108
MDC IDC SESS DTM: 20170131080256
MDC IDC SET LEADCHNL RA PACING AMPLITUDE: 2 V
MDC IDC SET LEADCHNL RV PACING AMPLITUDE: 2.5 V
MDC IDC SET LEADCHNL RV PACING PULSEWIDTH: 0.4 ms
MDC IDC STAT BRADY AP VP PERCENT: 1.7 %
MDC IDC STAT BRADY AS VP PERCENT: 1.1 %
MDC IDC STAT BRADY AS VS PERCENT: 77 %
MDC IDC STAT BRADY RA PERCENT PACED: 21 %

## 2015-05-28 ENCOUNTER — Encounter: Payer: Self-pay | Admitting: Cardiology

## 2015-07-27 DIAGNOSIS — I951 Orthostatic hypotension: Secondary | ICD-10-CM | POA: Diagnosis not present

## 2015-07-27 DIAGNOSIS — I480 Paroxysmal atrial fibrillation: Secondary | ICD-10-CM | POA: Diagnosis not present

## 2015-07-27 DIAGNOSIS — Z1389 Encounter for screening for other disorder: Secondary | ICD-10-CM | POA: Diagnosis not present

## 2015-07-27 DIAGNOSIS — Z950 Presence of cardiac pacemaker: Secondary | ICD-10-CM | POA: Diagnosis not present

## 2015-07-27 DIAGNOSIS — I10 Essential (primary) hypertension: Secondary | ICD-10-CM | POA: Diagnosis not present

## 2015-07-27 DIAGNOSIS — Z6820 Body mass index (BMI) 20.0-20.9, adult: Secondary | ICD-10-CM | POA: Diagnosis not present

## 2015-07-27 DIAGNOSIS — K2950 Unspecified chronic gastritis without bleeding: Secondary | ICD-10-CM | POA: Diagnosis not present

## 2015-07-27 DIAGNOSIS — N183 Chronic kidney disease, stage 3 (moderate): Secondary | ICD-10-CM | POA: Diagnosis not present

## 2015-07-27 DIAGNOSIS — M810 Age-related osteoporosis without current pathological fracture: Secondary | ICD-10-CM | POA: Diagnosis not present

## 2015-07-27 DIAGNOSIS — E7800 Pure hypercholesterolemia, unspecified: Secondary | ICD-10-CM | POA: Diagnosis not present

## 2015-07-27 DIAGNOSIS — M1990 Unspecified osteoarthritis, unspecified site: Secondary | ICD-10-CM | POA: Diagnosis not present

## 2015-07-27 DIAGNOSIS — E559 Vitamin D deficiency, unspecified: Secondary | ICD-10-CM | POA: Diagnosis not present

## 2015-08-11 ENCOUNTER — Ambulatory Visit (INDEPENDENT_AMBULATORY_CARE_PROVIDER_SITE_OTHER): Payer: Medicare Other | Admitting: Internal Medicine

## 2015-08-11 ENCOUNTER — Encounter: Payer: Self-pay | Admitting: Internal Medicine

## 2015-08-11 VITALS — BP 132/58 | HR 87 | Ht 62.0 in | Wt 113.8 lb

## 2015-08-11 DIAGNOSIS — Z950 Presence of cardiac pacemaker: Secondary | ICD-10-CM

## 2015-08-11 DIAGNOSIS — I442 Atrioventricular block, complete: Secondary | ICD-10-CM | POA: Diagnosis not present

## 2015-08-11 DIAGNOSIS — I480 Paroxysmal atrial fibrillation: Secondary | ICD-10-CM

## 2015-08-11 DIAGNOSIS — R55 Syncope and collapse: Secondary | ICD-10-CM

## 2015-08-11 NOTE — Progress Notes (Signed)
Patient Care Team: Haywood Pao, MD as PCP - General (Internal Medicine)   HPI  Joy Newman is a 82 y.o. female Seen in followup for pacemaker implantation January 13 for syncope and intermittent complete heart block. She had carotid sinus hypersensitivity and antecedent PR prolongation suggestive of a neurally mediated trigger. His history of myocardial infarction and ischemic heart disease.  Echo 2013 demonstrated normal left ventricular function    She denies chest pain or shortness of breath  She has some paroxysms of atrial fibrillation. She is on low-dose apixaban; this was held for one week following her fall (see below)   Device not interrogated as best as I can tell but shows no arrhjythmia, and still with issues of OVERSENSING of A  And V  Leads-simultaneously   She has had no recurrent synocpe  she has had some palpitations.  Past Medical History  Diagnosis Date  . Hyperlipidemia   . Diastolic dysfunction   . CAD (coronary artery disease) no obstruction at cath     Hx of with an anteroapical M.I  . Pulmonary hypertension (HCC)      Hx of Mild  . Tricuspid regurgitation     Moderate regurgitation  . Kidney stones   . Arteriovenous malformation of colon   . Diverticulosis   . Carotid sinus hypersensitivity   . Syncope   . Hypertension   . PVC and PACs   . Complete heart block-intermittent   . Pacemaker     St Jude  . MVP (mitral valve prolapse)   . Basal cell carcinoma   . Melanoma ([place])   . Squamous carcinoma ([place])   . Myocardial infarction ([place])     2005  . Orthostasis   . Tubular adenoma of colon 02/2012    Past Surgical History  Procedure Laterality Date  . Appendectomy  1954    Hx of  . Tonsillectomy  1971    Hx of  . Abdominal hysterectomy  1980    Hx of  . Cardiac catheterization      Revealed an apical wall motion abnormality  . Cardiovascular stress test  02-21-2006    EF 84%, which revealed an apical defect  .  Nasal reconstruction  1981  . Skin cancer excision      several, basal cell, squamous, melanoma  . Pacemaker insertion    . Permanent pacemaker insertion N/A 04/13/2011    Procedure: PERMANENT PACEMAKER INSERTION;  Surgeon: Evans Lance, MD;  Location: [facility] CATH LAB;  Service: Cardiovascular;  Laterality: N/A;    Current Outpatient Prescriptions  Medication Sig Dispense Refill  . apixaban (ELIQUIS) 2.5 MG TABS tablet Take 1 tablet (2.5 mg total) by mouth 2 (two) times daily. Re-tart this medicine on 11/27/14 60 tablet 11  . Ascorbic Acid (VITAMIN C) 1000 MG tablet Take 1,000 mg by mouth daily.     Marland Kitchen BIOTIN PO Take 600 mg by mouth 2 (two) times daily.     . Calcium Carb-Cholecalciferol (CALCIUM 600 + D PO) Take 600 mg by mouth 2 (two) times daily.    . Cholecalciferol (VITAMIN D) 2000 UNITS tablet Take 2,000 Units by mouth daily. VITAMIN D3 2000 MG    . diltiazem (CARDIZEM CD) 180 MG 24 hr capsule Take 1 capsule (180 mg total) by mouth every morning.    . Multiple Vitamin (MULTIVITAMIN WITH MINERALS) TABS tablet Take 1 tablet by mouth daily.    . niacinamide 500 MG tablet Take 500 mg  by mouth 2 (two) times daily with a meal.    . OVER THE COUNTER MEDICATION Place 1 drop into both eyes daily. OTC eye drops for dry eyes    . pantoprazole (PROTONIX) 40 MG tablet Take 40 mg by mouth daily.     . rosuvastatin (CRESTOR) 5 MG tablet Take 5 mg by mouth daily.     No current facility-administered medications for this visit.    Allergies  Allergen Reactions  . Imdur [Isosorbide Mononitrate]     Intolerant and cause headaches and nausea  . Polysporin [Bacitracin-Polymyxin B]     Rash  . Quinine Derivatives     Increase in Heart Rate and Decrease in BP   . Tetracyclines & Related     Rash    Review of Systems negative except from HPI and PMH  Physical Exam BP 132/58 mmHg  Pulse 87  Ht 5\' 2"  (1.575 m)  Wt 113 lb 12.8 oz (51.619 kg)  BMI 20.81 kg/m2 Well developed and well nourished in no  acute distress HENT normal; she is missing her front teeth E scleral and icterus clear Neck Supple JVP flat; carotids brisk and full Device pocket well healed; without hematoma or erythema.  There is no tethering  Clear to ausculation  Regular rate and rhythm, no murmurs gallops or rub Soft with active bowel sounds No clubbing cyanosis  Edema Alert and oriented, grossly normal motor and sensory function Skin Warm and Dry  ECG 8/19 demonstrated sinus rhythm at 73 Intervals 21/08/40 RSR prime Nonspecific ST segment flattening   Assessment and  Plan  Atrial fibrillation-paroxysmal  Pacemaker-St. Jude The patient's device was interrogated.  The information was reviewed. No changes were made in the programming.    Syncope-carotid sinus hypersensitivity  Intermittent complete heart block  Nose reversion-atrial/ventricular lead  The patient continues to have episodes of noise on the atrial lead which is now reproducible with manipulation. Unfortunately unipolar pacing did not obviate suggesting that there is a through and through lead fracture in the atrial lead.  Thankfully however, the ventricular lead is functioning normally. Her problem is intermittent complete heart block and so we will not pursue replacement. 1 hypothesis for palpitations was asynchronous ventricular pacing; this was not supported during testing.  We will continue the current plan

## 2015-08-11 NOTE — Patient Instructions (Signed)
Medication Instructions: - Your physician recommends that you continue on your current medications as directed. Please refer to the Current Medication list given to you today.  Labwork: - none  Procedures/Testing: - none  Follow-Up: - Remote monitoring is used to monitor your Pacemaker of ICD from home. This monitoring reduces the number of office visits required to check your device to one time per year. It allows [place] to keep an eye on the functioning of your device to ensure it is working properly. You are scheduled for a device check from home on 11/10/15. You may send your transmission at any time that day. If you have a wireless device, the transmission will be sent automatically. After your physician reviews your transmission, you will receive a postcard with your next transmission date.  - Your physician wants you to follow-up in: 1 year with Dr. Caryl Comes. You will receive a reminder letter in the mail two months in advance. If you don't receive a letter, please call our office to schedule the follow-up appointment.  Any Additional Special Instructions Will Be Listed Below (If Applicable).     If you need a refill on your cardiac medications before your next appointment, please call your pharmacy.

## 2015-08-17 LAB — CUP PACEART INCLINIC DEVICE CHECK
Brady Statistic RA Percent Paced: 18 %
Brady Statistic RV Percent Paced: 2.4 %
Date Time Interrogation Session: 20170510175529
Implantable Lead Implant Date: 20130110
Implantable Lead Location: 753859
Lead Channel Impedance Value: 375 Ohm
Lead Channel Pacing Threshold Amplitude: 0.5 V
Lead Channel Pacing Threshold Pulse Width: 0.4 ms
Lead Channel Sensing Intrinsic Amplitude: 12 mV
Lead Channel Sensing Intrinsic Amplitude: 3.2 mV
MDC IDC LEAD IMPLANT DT: 20130110
MDC IDC LEAD LOCATION: 753860
MDC IDC MSMT BATTERY REMAINING LONGEVITY: 134.4
MDC IDC MSMT BATTERY VOLTAGE: 2.96 V
MDC IDC MSMT LEADCHNL RA IMPEDANCE VALUE: 350 Ohm
MDC IDC MSMT LEADCHNL RV PACING THRESHOLD AMPLITUDE: 1.25 V
MDC IDC MSMT LEADCHNL RV PACING THRESHOLD PULSEWIDTH: 0.4 ms
MDC IDC PG SERIAL: 7303108
MDC IDC SET LEADCHNL RA PACING AMPLITUDE: 2 V
MDC IDC SET LEADCHNL RV PACING AMPLITUDE: 2.5 V
MDC IDC SET LEADCHNL RV PACING PULSEWIDTH: 0.4 ms
MDC IDC SET LEADCHNL RV SENSING SENSITIVITY: 4.5 mV
Pulse Gen Model: 2210

## 2015-08-19 DIAGNOSIS — S0990XA Unspecified injury of head, initial encounter: Secondary | ICD-10-CM | POA: Diagnosis not present

## 2015-08-19 DIAGNOSIS — Z950 Presence of cardiac pacemaker: Secondary | ICD-10-CM | POA: Diagnosis not present

## 2015-08-19 DIAGNOSIS — Z881 Allergy status to other antibiotic agents status: Secondary | ICD-10-CM | POA: Diagnosis not present

## 2015-08-19 DIAGNOSIS — Z79899 Other long term (current) drug therapy: Secondary | ICD-10-CM | POA: Diagnosis not present

## 2015-08-19 DIAGNOSIS — S52501A Unspecified fracture of the lower end of right radius, initial encounter for closed fracture: Secondary | ICD-10-CM | POA: Diagnosis not present

## 2015-08-19 DIAGNOSIS — I10 Essential (primary) hypertension: Secondary | ICD-10-CM | POA: Diagnosis not present

## 2015-08-19 DIAGNOSIS — S52509A Unspecified fracture of the lower end of unspecified radius, initial encounter for closed fracture: Secondary | ICD-10-CM | POA: Diagnosis not present

## 2015-08-19 DIAGNOSIS — Z888 Allergy status to other drugs, medicaments and biological substances status: Secondary | ICD-10-CM | POA: Diagnosis not present

## 2015-08-19 DIAGNOSIS — W010XXA Fall on same level from slipping, tripping and stumbling without subsequent striking against object, initial encounter: Secondary | ICD-10-CM | POA: Diagnosis not present

## 2015-08-19 DIAGNOSIS — Z7901 Long term (current) use of anticoagulants: Secondary | ICD-10-CM | POA: Diagnosis not present

## 2015-08-19 DIAGNOSIS — S0993XA Unspecified injury of face, initial encounter: Secondary | ICD-10-CM | POA: Diagnosis not present

## 2015-08-19 DIAGNOSIS — S0083XA Contusion of other part of head, initial encounter: Secondary | ICD-10-CM | POA: Diagnosis not present

## 2015-08-19 DIAGNOSIS — S6991XA Unspecified injury of right wrist, hand and finger(s), initial encounter: Secondary | ICD-10-CM | POA: Diagnosis not present

## 2015-08-23 DIAGNOSIS — S52531A Colles' fracture of right radius, initial encounter for closed fracture: Secondary | ICD-10-CM | POA: Diagnosis not present

## 2015-09-01 ENCOUNTER — Telehealth: Payer: Self-pay | Admitting: Internal Medicine

## 2015-09-01 DIAGNOSIS — Z6820 Body mass index (BMI) 20.0-20.9, adult: Secondary | ICD-10-CM | POA: Diagnosis not present

## 2015-09-01 DIAGNOSIS — M810 Age-related osteoporosis without current pathological fracture: Secondary | ICD-10-CM | POA: Diagnosis not present

## 2015-09-01 DIAGNOSIS — S52301S Unspecified fracture of shaft of right radius, sequela: Secondary | ICD-10-CM | POA: Diagnosis not present

## 2015-09-01 NOTE — Telephone Encounter (Signed)
Routed to Alvis Lemmings, RN and Dr. Caryl Comes to make them aware.

## 2015-09-01 NOTE — Telephone Encounter (Signed)
New Message   Pt went to Dr. Osborne Casco after fall on a trip.... Pt states Pace making working fine, bp was good... Just to have it kleins record about the fall  Pt states she wanted information to be put in her chart   1. Has your device fired? no  2. Is you device beeping? no  3. Are you experiencing draining or swelling at device site? no  4. Are you calling to see if we received your device transmission? no  5. Have you passed out? no

## 2015-09-10 ENCOUNTER — Encounter: Payer: Self-pay | Admitting: Internal Medicine

## 2015-09-21 ENCOUNTER — Other Ambulatory Visit: Payer: Self-pay | Admitting: Internal Medicine

## 2015-09-21 DIAGNOSIS — S52531D Colles' fracture of right radius, subsequent encounter for closed fracture with routine healing: Secondary | ICD-10-CM | POA: Diagnosis not present

## 2015-09-21 DIAGNOSIS — Z1231 Encounter for screening mammogram for malignant neoplasm of breast: Secondary | ICD-10-CM

## 2015-09-29 ENCOUNTER — Ambulatory Visit
Admission: RE | Admit: 2015-09-29 | Discharge: 2015-09-29 | Disposition: A | Discharge status: Home / Self Care | Payer: Medicare Other | Source: Ambulatory Visit | Attending: Internal Medicine | Admitting: Internal Medicine

## 2015-09-29 DIAGNOSIS — Z1231 Encounter for screening mammogram for malignant neoplasm of breast: Secondary | ICD-10-CM

## 2015-10-17 IMAGING — CR DG CHEST 1V PORT
1 series · 1 of 1 positions shown · non-contrast
Comparison: 01/30/2013

CLINICAL DATA: Syncopal episode today.

EXAM:
PORTABLE CHEST - 1 VIEW

[AP]
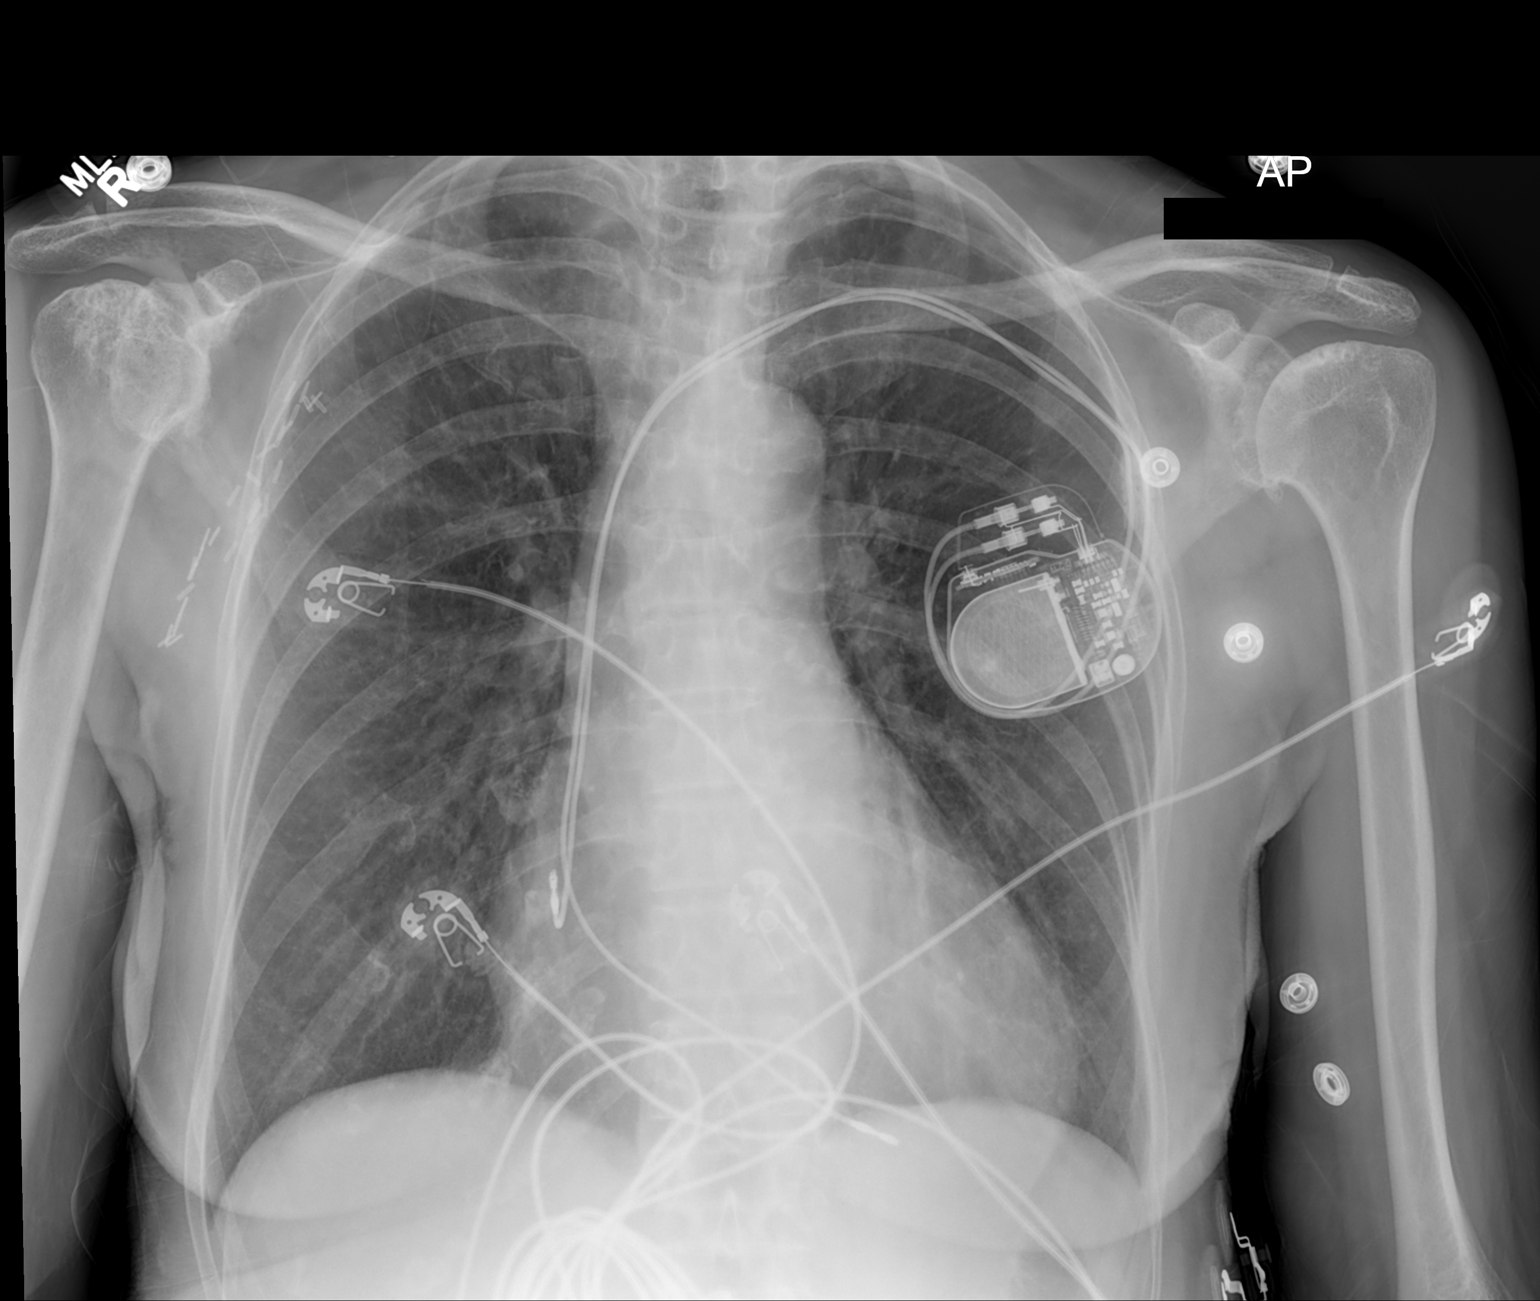

[1 of 1 positions shown; findings below may reference images not displayed]

FINDINGS: Stable appearance of the left dual chamber cardiac pacemaker. Heart
size is normal. The trachea is midline. Again noted are surgical
clips in the right axilla. There is stable sclerosis and mild
deformity of the right humeral head. Again noted are degenerative
changes at the left glenohumeral joint. Lungs are clear without
airspace disease or pulmonary edema.
IMPRESSION: No acute chest abnormality. Stable appearance of the left cardiac
pacemaker.

## 2015-10-19 DIAGNOSIS — S52531D Colles' fracture of right radius, subsequent encounter for closed fracture with routine healing: Secondary | ICD-10-CM | POA: Diagnosis not present

## 2015-10-19 DIAGNOSIS — M65331 Trigger finger, right middle finger: Secondary | ICD-10-CM | POA: Diagnosis not present

## 2015-11-10 ENCOUNTER — Telehealth: Payer: Self-pay | Admitting: Internal Medicine

## 2015-11-10 ENCOUNTER — Ambulatory Visit (INDEPENDENT_AMBULATORY_CARE_PROVIDER_SITE_OTHER): Payer: Medicare Other | Admitting: *Deleted

## 2015-11-10 DIAGNOSIS — I442 Atrioventricular block, complete: Secondary | ICD-10-CM

## 2015-11-10 NOTE — Progress Notes (Signed)
Remote pacemaker transmission.   

## 2015-11-11 ENCOUNTER — Encounter: Payer: Self-pay | Admitting: Cardiology

## 2015-11-16 DIAGNOSIS — S52531D Colles' fracture of right radius, subsequent encounter for closed fracture with routine healing: Secondary | ICD-10-CM | POA: Diagnosis not present

## 2015-11-16 DIAGNOSIS — M65331 Trigger finger, right middle finger: Secondary | ICD-10-CM | POA: Diagnosis not present

## 2015-11-17 LAB — CUP PACEART REMOTE DEVICE CHECK
Battery Voltage: 2.95 V
Brady Statistic AP VP Percent: 1 %
Brady Statistic AP VS Percent: 15 %
Brady Statistic AS VP Percent: 1 %
Brady Statistic AS VS Percent: 84 %
Implantable Lead Implant Date: 20130110
Implantable Lead Implant Date: 20130110
Implantable Lead Location: 753859
Implantable Lead Location: 753860
Lead Channel Impedance Value: 410 Ohm
Lead Channel Pacing Threshold Amplitude: 0.5 V
Lead Channel Pacing Threshold Amplitude: 1.25 V
Lead Channel Pacing Threshold Pulse Width: 0.4 ms
Lead Channel Pacing Threshold Pulse Width: 0.4 ms
Lead Channel Sensing Intrinsic Amplitude: 12 mV
Lead Channel Setting Pacing Amplitude: 2 V
Lead Channel Setting Pacing Amplitude: 2.5 V
Lead Channel Setting Pacing Pulse Width: 0.4 ms
MDC IDC MSMT BATTERY REMAINING LONGEVITY: 111 mo
MDC IDC MSMT BATTERY REMAINING PERCENTAGE: 91 %
MDC IDC MSMT LEADCHNL RA IMPEDANCE VALUE: 350 Ohm
MDC IDC MSMT LEADCHNL RA SENSING INTR AMPL: 3.8 mV
MDC IDC SESS DTM: 20170809071012
MDC IDC SET LEADCHNL RV SENSING SENSITIVITY: 4.5 mV
MDC IDC STAT BRADY RA PERCENT PACED: 15 %
MDC IDC STAT BRADY RV PERCENT PACED: 1.2 %
Pulse Gen Serial Number: 7303108

## 2015-11-29 NOTE — Telephone Encounter (Signed)
Closed encounter °

## 2016-01-25 DIAGNOSIS — I10 Essential (primary) hypertension: Secondary | ICD-10-CM | POA: Diagnosis not present

## 2016-01-25 DIAGNOSIS — E559 Vitamin D deficiency, unspecified: Secondary | ICD-10-CM | POA: Diagnosis not present

## 2016-01-25 DIAGNOSIS — R8290 Unspecified abnormal findings in urine: Secondary | ICD-10-CM | POA: Diagnosis not present

## 2016-01-25 DIAGNOSIS — E7800 Pure hypercholesterolemia, unspecified: Secondary | ICD-10-CM | POA: Diagnosis not present

## 2016-02-01 DIAGNOSIS — M810 Age-related osteoporosis without current pathological fracture: Secondary | ICD-10-CM | POA: Diagnosis not present

## 2016-02-01 DIAGNOSIS — R002 Palpitations: Secondary | ICD-10-CM | POA: Diagnosis not present

## 2016-02-01 DIAGNOSIS — N183 Chronic kidney disease, stage 3 (moderate): Secondary | ICD-10-CM | POA: Diagnosis not present

## 2016-02-01 DIAGNOSIS — Z1389 Encounter for screening for other disorder: Secondary | ICD-10-CM | POA: Diagnosis not present

## 2016-02-01 DIAGNOSIS — Z0000 Encounter for general adult medical examination without abnormal findings: Secondary | ICD-10-CM | POA: Diagnosis not present

## 2016-02-01 DIAGNOSIS — Z6820 Body mass index (BMI) 20.0-20.9, adult: Secondary | ICD-10-CM | POA: Diagnosis not present

## 2016-02-01 DIAGNOSIS — I10 Essential (primary) hypertension: Secondary | ICD-10-CM | POA: Diagnosis not present

## 2016-02-01 DIAGNOSIS — D128 Benign neoplasm of rectum: Secondary | ICD-10-CM | POA: Diagnosis not present

## 2016-02-01 DIAGNOSIS — Z23 Encounter for immunization: Secondary | ICD-10-CM | POA: Diagnosis not present

## 2016-02-01 DIAGNOSIS — I480 Paroxysmal atrial fibrillation: Secondary | ICD-10-CM | POA: Diagnosis not present

## 2016-02-01 DIAGNOSIS — E7800 Pure hypercholesterolemia, unspecified: Secondary | ICD-10-CM | POA: Diagnosis not present

## 2016-02-01 DIAGNOSIS — I1310 Hypertensive heart and chronic kidney disease without heart failure, with stage 1 through stage 4 chronic kidney disease, or unspecified chronic kidney disease: Secondary | ICD-10-CM | POA: Diagnosis not present

## 2016-02-04 DIAGNOSIS — H40032 Anatomical narrow angle, left eye: Secondary | ICD-10-CM | POA: Diagnosis not present

## 2016-02-04 DIAGNOSIS — H5203 Hypermetropia, bilateral: Secondary | ICD-10-CM | POA: Diagnosis not present

## 2016-02-04 DIAGNOSIS — H25813 Combined forms of age-related cataract, bilateral: Secondary | ICD-10-CM | POA: Diagnosis not present

## 2016-02-04 DIAGNOSIS — H4720 Unspecified optic atrophy: Secondary | ICD-10-CM | POA: Diagnosis not present

## 2016-02-08 ENCOUNTER — Telehealth: Payer: Self-pay | Admitting: Cardiovascular Disease

## 2016-02-08 NOTE — Telephone Encounter (Signed)
Pt has been seen by Dr. Caryl Comes in office, not Dr. Acie Fredrickson.  Will forward to Dr. Caryl Comes for review and advisement.

## 2016-02-08 NOTE — Telephone Encounter (Signed)
Request for surgical clearance:  1. What type of surgery is being performed? Yag Laser Peripheral Iridotomy   2. When is this surgery scheduled? Left eye on 03-09-16 ,the right eye 03-16-16  3. Are there any medications that need to be held prior to surgery and how long? Can she stop her Eliquis 5 to 7 days prior to surgery?   4. Name of physician performing surgery? Dr Shon Hough   5. What is your office phone and fax number? [phone] and fax number is [phone]

## 2016-02-09 ENCOUNTER — Ambulatory Visit (INDEPENDENT_AMBULATORY_CARE_PROVIDER_SITE_OTHER): Payer: Medicare Other | Admitting: *Deleted

## 2016-02-09 DIAGNOSIS — I442 Atrioventricular block, complete: Secondary | ICD-10-CM | POA: Diagnosis not present

## 2016-02-09 NOTE — Progress Notes (Signed)
Remote pacemaker transmission.   

## 2016-02-09 NOTE — Telephone Encounter (Signed)
Will forward to [facility] pharmacist to review.

## 2016-02-09 NOTE — Telephone Encounter (Signed)
Patient takes Eliquis for afib with CHADS2 score of 2 (HTN and age). No need to stop Eliquis for 5-7 days prior - that is the appropriate timeframe to stop warfarin not Eliquis. Eliquis is cleared from the body within 24 hours. Recommend that patient holds Eliquis for 24-48 hours prior to the first eye procedure on 12/7, then resume within 24 hours. She can hold again 24-48 hours prior to right eye procedure and resume within 24 hours. Clearance faxed to Dr Kathrin Penner.

## 2016-02-10 ENCOUNTER — Encounter: Payer: Self-pay | Admitting: Cardiology

## 2016-03-07 LAB — CUP PACEART REMOTE DEVICE CHECK
Battery Remaining Longevity: 98 mo
Battery Remaining Percentage: 81 %
Brady Statistic AP VS Percent: 17 %
Brady Statistic AS VS Percent: 81 %
Brady Statistic RV Percent Paced: 1.7 %
Implantable Lead Implant Date: 20130110
Lead Channel Impedance Value: 360 Ohm
Lead Channel Pacing Threshold Amplitude: 1.25 V
Lead Channel Pacing Threshold Pulse Width: 0.4 ms
Lead Channel Sensing Intrinsic Amplitude: 3.2 mV
Lead Channel Setting Pacing Pulse Width: 0.4 ms
Lead Channel Setting Sensing Sensitivity: 4.5 mV
MDC IDC LEAD IMPLANT DT: 20130110
MDC IDC LEAD LOCATION: 753859
MDC IDC LEAD LOCATION: 753860
MDC IDC MSMT BATTERY VOLTAGE: 2.93 V
MDC IDC MSMT LEADCHNL RA PACING THRESHOLD AMPLITUDE: 0.5 V
MDC IDC MSMT LEADCHNL RA PACING THRESHOLD PULSEWIDTH: 0.4 ms
MDC IDC MSMT LEADCHNL RV IMPEDANCE VALUE: 360 Ohm
MDC IDC MSMT LEADCHNL RV SENSING INTR AMPL: 12 mV
MDC IDC PG IMPLANT DT: 20130110
MDC IDC PG SERIAL: 7303108
MDC IDC SESS DTM: 20171108073925
MDC IDC SET LEADCHNL RA PACING AMPLITUDE: 2 V
MDC IDC SET LEADCHNL RV PACING AMPLITUDE: 2.5 V
MDC IDC STAT BRADY AP VP PERCENT: 1 %
MDC IDC STAT BRADY AS VP PERCENT: 1 %
MDC IDC STAT BRADY RA PERCENT PACED: 17 %

## 2016-03-09 DIAGNOSIS — H40032 Anatomical narrow angle, left eye: Secondary | ICD-10-CM | POA: Diagnosis not present

## 2016-03-23 DIAGNOSIS — H40001 Preglaucoma, unspecified, right eye: Secondary | ICD-10-CM | POA: Diagnosis not present

## 2016-03-23 DIAGNOSIS — H40031 Anatomical narrow angle, right eye: Secondary | ICD-10-CM | POA: Diagnosis not present

## 2016-04-05 DIAGNOSIS — H01001 Unspecified blepharitis right upper eyelid: Secondary | ICD-10-CM | POA: Diagnosis not present

## 2016-04-05 DIAGNOSIS — H0015 Chalazion left lower eyelid: Secondary | ICD-10-CM | POA: Diagnosis not present

## 2016-04-05 DIAGNOSIS — H0011 Chalazion right upper eyelid: Secondary | ICD-10-CM | POA: Diagnosis not present

## 2016-04-05 DIAGNOSIS — H01004 Unspecified blepharitis left upper eyelid: Secondary | ICD-10-CM | POA: Diagnosis not present

## 2016-04-21 DIAGNOSIS — H25813 Combined forms of age-related cataract, bilateral: Secondary | ICD-10-CM | POA: Diagnosis not present

## 2016-04-21 DIAGNOSIS — H40033 Anatomical narrow angle, bilateral: Secondary | ICD-10-CM | POA: Diagnosis not present

## 2016-04-21 DIAGNOSIS — H5704 Mydriasis: Secondary | ICD-10-CM | POA: Diagnosis not present

## 2016-04-25 DIAGNOSIS — H25812 Combined forms of age-related cataract, left eye: Secondary | ICD-10-CM | POA: Diagnosis not present

## 2016-04-25 DIAGNOSIS — H5703 Miosis: Secondary | ICD-10-CM | POA: Diagnosis not present

## 2016-04-25 DIAGNOSIS — H268 Other specified cataract: Secondary | ICD-10-CM | POA: Diagnosis not present

## 2016-05-09 DIAGNOSIS — H25811 Combined forms of age-related cataract, right eye: Secondary | ICD-10-CM | POA: Diagnosis not present

## 2016-05-09 DIAGNOSIS — H268 Other specified cataract: Secondary | ICD-10-CM | POA: Diagnosis not present

## 2016-05-10 ENCOUNTER — Ambulatory Visit (INDEPENDENT_AMBULATORY_CARE_PROVIDER_SITE_OTHER): Payer: Medicare Other | Admitting: *Deleted

## 2016-05-10 DIAGNOSIS — I442 Atrioventricular block, complete: Secondary | ICD-10-CM

## 2016-05-10 NOTE — Progress Notes (Signed)
Remote pacemaker transmission.   

## 2016-05-12 ENCOUNTER — Encounter: Payer: Self-pay | Admitting: Cardiology

## 2016-05-13 LAB — CUP PACEART REMOTE DEVICE CHECK
Battery Remaining Longevity: 111 mo
Battery Remaining Percentage: 91 %
Brady Statistic AP VS Percent: 19 %
Brady Statistic AS VS Percent: 79 %
Brady Statistic RA Percent Paced: 19 %
Brady Statistic RV Percent Paced: 1.9 %
Implantable Lead Implant Date: 20130110
Implantable Lead Location: 753859
Lead Channel Impedance Value: 380 Ohm
Lead Channel Pacing Threshold Pulse Width: 0.4 ms
Lead Channel Pacing Threshold Pulse Width: 0.4 ms
Lead Channel Sensing Intrinsic Amplitude: 3.4 mV
Lead Channel Setting Sensing Sensitivity: 4.5 mV
MDC IDC LEAD IMPLANT DT: 20130110
MDC IDC LEAD LOCATION: 753860
MDC IDC MSMT BATTERY VOLTAGE: 2.95 V
MDC IDC MSMT LEADCHNL RA PACING THRESHOLD AMPLITUDE: 0.5 V
MDC IDC MSMT LEADCHNL RV IMPEDANCE VALUE: 410 Ohm
MDC IDC MSMT LEADCHNL RV PACING THRESHOLD AMPLITUDE: 1.25 V
MDC IDC MSMT LEADCHNL RV SENSING INTR AMPL: 12 mV
MDC IDC PG IMPLANT DT: 20130110
MDC IDC PG SERIAL: 7303108
MDC IDC SESS DTM: 20180207081135
MDC IDC SET LEADCHNL RA PACING AMPLITUDE: 2 V
MDC IDC SET LEADCHNL RV PACING AMPLITUDE: 2.5 V
MDC IDC SET LEADCHNL RV PACING PULSEWIDTH: 0.4 ms
MDC IDC STAT BRADY AP VP PERCENT: 1 %
MDC IDC STAT BRADY AS VP PERCENT: 1 %

## 2016-06-16 DIAGNOSIS — Z85828 Personal history of other malignant neoplasm of skin: Secondary | ICD-10-CM | POA: Diagnosis not present

## 2016-06-16 DIAGNOSIS — Z85820 Personal history of malignant melanoma of skin: Secondary | ICD-10-CM | POA: Diagnosis not present

## 2016-06-16 DIAGNOSIS — L821 Other seborrheic keratosis: Secondary | ICD-10-CM | POA: Diagnosis not present

## 2016-06-21 DIAGNOSIS — H01004 Unspecified blepharitis left upper eyelid: Secondary | ICD-10-CM | POA: Diagnosis not present

## 2016-06-21 DIAGNOSIS — H02051 Trichiasis without entropian right upper eyelid: Secondary | ICD-10-CM | POA: Diagnosis not present

## 2016-06-21 DIAGNOSIS — H01001 Unspecified blepharitis right upper eyelid: Secondary | ICD-10-CM | POA: Diagnosis not present

## 2016-06-21 DIAGNOSIS — H02054 Trichiasis without entropian left upper eyelid: Secondary | ICD-10-CM | POA: Diagnosis not present

## 2016-06-29 DIAGNOSIS — H10503 Unspecified blepharoconjunctivitis, bilateral: Secondary | ICD-10-CM | POA: Diagnosis not present

## 2016-06-29 DIAGNOSIS — H40243 Residual stage of angle-closure glaucoma, bilateral: Secondary | ICD-10-CM | POA: Diagnosis not present

## 2016-08-02 DIAGNOSIS — M1990 Unspecified osteoarthritis, unspecified site: Secondary | ICD-10-CM | POA: Diagnosis not present

## 2016-08-02 DIAGNOSIS — I1310 Hypertensive heart and chronic kidney disease without heart failure, with stage 1 through stage 4 chronic kidney disease, or unspecified chronic kidney disease: Secondary | ICD-10-CM | POA: Diagnosis not present

## 2016-08-02 DIAGNOSIS — E559 Vitamin D deficiency, unspecified: Secondary | ICD-10-CM | POA: Diagnosis not present

## 2016-08-02 DIAGNOSIS — Z6820 Body mass index (BMI) 20.0-20.9, adult: Secondary | ICD-10-CM | POA: Diagnosis not present

## 2016-08-02 DIAGNOSIS — I951 Orthostatic hypotension: Secondary | ICD-10-CM | POA: Diagnosis not present

## 2016-08-02 DIAGNOSIS — I480 Paroxysmal atrial fibrillation: Secondary | ICD-10-CM | POA: Diagnosis not present

## 2016-08-02 DIAGNOSIS — Z950 Presence of cardiac pacemaker: Secondary | ICD-10-CM | POA: Diagnosis not present

## 2016-08-02 DIAGNOSIS — N183 Chronic kidney disease, stage 3 (moderate): Secondary | ICD-10-CM | POA: Diagnosis not present

## 2016-08-02 DIAGNOSIS — E7800 Pure hypercholesterolemia, unspecified: Secondary | ICD-10-CM | POA: Diagnosis not present

## 2016-08-02 DIAGNOSIS — M810 Age-related osteoporosis without current pathological fracture: Secondary | ICD-10-CM | POA: Diagnosis not present

## 2016-08-17 ENCOUNTER — Encounter: Payer: Medicare Other | Admitting: Internal Medicine

## 2016-08-23 DIAGNOSIS — H01001 Unspecified blepharitis right upper eyelid: Secondary | ICD-10-CM | POA: Diagnosis not present

## 2016-08-23 DIAGNOSIS — H40243 Residual stage of angle-closure glaucoma, bilateral: Secondary | ICD-10-CM | POA: Diagnosis not present

## 2016-08-23 DIAGNOSIS — H01004 Unspecified blepharitis left upper eyelid: Secondary | ICD-10-CM | POA: Diagnosis not present

## 2016-08-23 DIAGNOSIS — Z961 Presence of intraocular lens: Secondary | ICD-10-CM | POA: Diagnosis not present

## 2016-08-24 NOTE — Progress Notes (Signed)
Patient Care Team: Tisovec, Fransico Him, MD as PCP - General (Internal Medicine)   HPI  Joy Newman is a 83 y.o. female Seen in followup for pacemaker implantation January 13 for syncope and intermittent complete heart block. She had carotid sinus hypersensitivity and antecedent PR prolongation suggestive of a neurally mediated trigger. Remote history of myocardial infarction and ischemic heart disease.   Echo 2013 demonstrated normal left ventricular function   She has some paroxysms of atrial fibrillation. She is on low-dose apixaban without bleeding issues  The patient denies chest pain, shortness of breath, nocturnal dyspnea, orthopnea; she has some intermittent dependent peripheral edema.  There have been no palpitations, lightheadedness or syncope.   She told me about her support of the [employer] [facility] in [place]  Past Medical History:  Diagnosis Date  . Arteriovenous malformation of colon   . Basal cell carcinoma   . CAD (coronary artery disease) no obstruction at cath    Hx of with an anteroapical M.I  . Carotid sinus hypersensitivity   . Complete heart block-intermittent   . Diastolic dysfunction   . Diverticulosis   . Hyperlipidemia   . Hypertension   . Kidney stones   . Melanoma ([place])   . MVP (mitral valve prolapse)   . Myocardial infarction ([place])    2005  . Orthostasis   . Pacemaker    St Jude  . Pulmonary hypertension (HCC)     Hx of Mild  . PVC and PACs   . Squamous carcinoma   . Syncope   . Tricuspid regurgitation    Moderate regurgitation  . Tubular adenoma of colon 02/2012    Past Surgical History:  Procedure Laterality Date  . ABDOMINAL HYSTERECTOMY  1980   Hx of  . APPENDECTOMY  1954   Hx of  . CARDIAC CATHETERIZATION     Revealed an apical wall motion abnormality  . CARDIOVASCULAR STRESS TEST  02-21-2006   EF 84%, which revealed an apical defect  . NASAL RECONSTRUCTION  1981  . PACEMAKER INSERTION    .  PERMANENT PACEMAKER INSERTION N/A 04/13/2011   Procedure: PERMANENT PACEMAKER INSERTION;  Surgeon: Evans Lance, MD;  Location: [facility] C[facility];  Service: Cardiovascular;  Laterality: N/A;  . SKIN CANCER EXCISION     several, basal cell, squamous, melanoma  . TONSILLECTOMY  1971   Hx of    Current Outpatient Prescriptions  Medication Sig Dispense Refill  . apixaban (ELIQUIS) 2.5 MG TABS tablet Take 1 tablet (2.5 mg total) by mouth 2 (two) times daily. Re-tart this medicine on 11/27/14 60 tablet 11  . Ascorbic Acid (VITAMIN C) 1000 MG tablet Take 1,000 mg by mouth daily.     Marland Kitchen BIOTIN PO Take 600 mg by mouth 2 (two) times daily.     . Calcium Carb-Cholecalciferol (CALCIUM 600 + D PO) Take 600 mg by mouth 2 (two) times daily.    . Cholecalciferol (VITAMIN D) 2000 UNITS tablet Take 2,000 Units by mouth daily. VITAMIN D3 2000 MG    . diltiazem (CARDIZEM CD) 180 MG 24 hr capsule Take 1 capsule (180 mg total) by mouth every morning.    . Multiple Vitamin (MULTIVITAMIN WITH MINERALS) TABS tablet Take 1 tablet by mouth daily.    . niacinamide 500 MG tablet Take 500 mg by mouth 2 (two) times daily with a meal.    . OVER THE COUNTER MEDICATION Place 1 drop into both eyes daily. OTC eye drops for dry  eyes    . pantoprazole (PROTONIX) 40 MG tablet Take 40 mg by mouth daily.     . rosuvastatin (CRESTOR) 5 MG tablet Take 5 mg by mouth daily.     No current facility-administered medications for this visit.     Allergies  Allergen Reactions  . Imdur [Isosorbide Mononitrate]     Intolerant and cause headaches and nausea  . Polysporin [Bacitracin-Polymyxin B]     Rash  . Quinine Derivatives     Increase in Heart Rate and Decrease in BP   . Tetracyclines & Related     Rash    Review of Systems negative except from HPI and PMH  Physical Exam BP (!) 142/50   Pulse 88   Ht 5\' 2"  (1.575 m)   Wt 114 lb (51.7 kg)   SpO2 98%   BMI 20.85 kg/m  Well developed and nourished in no acute distress HENT  normal Neck supple with JVP-flat Carotids brisk and full without bruits Clear Device pocket well healed; without hematoma or erythema.  There is no tethering  Regular rate and rhythm, no murmurs or gallops Abd-soft with active BS without hepatomegaly No Clubbing cyanosis edema Skin-warm and dry A & Oriented  Grossly normal sensory and motor function   ECG 8/19 demonstrated sinus rhythm at 83 Intervals 20/07/37    Assessment and  Plan  Atrial fibrillation-paroxysmal  Pacemaker-St. Jude The patient's device was interrogated.  The information was reviewed. No changes were made in the programming.    Syncope-carotid sinus hypersensitivity  Intermittent complete heart block  Nose reversion-atrial/ventricular lead  Infrequent and Asx atrial fib  On Anticoagulation;  No bleeding issues   No syncope  No ventricular lead issues  Will check CBC

## 2016-08-25 ENCOUNTER — Encounter (INDEPENDENT_AMBULATORY_CARE_PROVIDER_SITE_OTHER): Payer: Self-pay

## 2016-08-25 ENCOUNTER — Encounter: Payer: Self-pay | Admitting: Internal Medicine

## 2016-08-25 ENCOUNTER — Ambulatory Visit (INDEPENDENT_AMBULATORY_CARE_PROVIDER_SITE_OTHER): Payer: Medicare Other | Admitting: Internal Medicine

## 2016-08-25 VITALS — BP 142/50 | HR 88 | Ht 62.0 in | Wt 114.0 lb

## 2016-08-25 DIAGNOSIS — Z950 Presence of cardiac pacemaker: Secondary | ICD-10-CM

## 2016-08-25 DIAGNOSIS — I480 Paroxysmal atrial fibrillation: Secondary | ICD-10-CM

## 2016-08-25 DIAGNOSIS — I442 Atrioventricular block, complete: Secondary | ICD-10-CM | POA: Diagnosis not present

## 2016-08-25 LAB — CUP PACEART INCLINIC DEVICE CHECK
Battery Voltage: 2.93 V
Brady Statistic RV Percent Paced: 1.9 %
Date Time Interrogation Session: 20180525153556
Implantable Lead Implant Date: 20130110
Implantable Lead Location: 753860
Implantable Pulse Generator Implant Date: 20130110
Lead Channel Impedance Value: 362.5 Ohm
Lead Channel Impedance Value: 412.5 Ohm
Lead Channel Pacing Threshold Amplitude: 1.25 V
Lead Channel Pacing Threshold Pulse Width: 0.4 ms
Lead Channel Pacing Threshold Pulse Width: 0.4 ms
Lead Channel Setting Pacing Amplitude: 2 V
Lead Channel Setting Pacing Amplitude: 2.5 V
Lead Channel Setting Sensing Sensitivity: 4.5 mV
MDC IDC LEAD IMPLANT DT: 20130110
MDC IDC LEAD LOCATION: 753859
MDC IDC MSMT LEADCHNL RA PACING THRESHOLD AMPLITUDE: 0.5 V
MDC IDC MSMT LEADCHNL RA SENSING INTR AMPL: 2.5 mV
MDC IDC MSMT LEADCHNL RV SENSING INTR AMPL: 12 mV
MDC IDC SET LEADCHNL RV PACING PULSEWIDTH: 0.4 ms
MDC IDC STAT BRADY RA PERCENT PACED: 21 %
Pulse Gen Model: 2210
Pulse Gen Serial Number: 7303108

## 2016-08-25 NOTE — Patient Instructions (Signed)
Medication Instructions: - Your physician recommends that you continue on your current medications as directed. Please refer to the Current Medication list given to you today.  Labwork: - Your physician recommends that you have lab work today: CBC  Procedures/Testing: - none ordered  Follow-Up: - Remote monitoring is used to monitor your Pacemaker of ICD from home. This monitoring reduces the number of office visits required to check your device to one time per year. It allows [place] to keep an eye on the functioning of your device to ensure it is working properly. You are scheduled for a device check from home on 11/27/16. You may send your transmission at any time that day. If you have a wireless device, the transmission will be sent automatically. After your physician reviews your transmission, you will receive a postcard with your next transmission date.  - Your physician wants you to follow-up in: 1 year with Dr. Caryl Comes. You will receive a reminder letter in the mail two months in advance. If you don't receive a letter, please call our office to schedule the follow-up appointment.   Any Additional Special Instructions Will Be Listed Below (If Applicable).     If you need a refill on your cardiac medications before your next appointment, please call your pharmacy.

## 2016-08-26 LAB — CBC WITH DIFFERENTIAL/PLATELET
BASOS ABS: 0.1 10*3/uL (ref 0.0–0.2)
Basos: 1 %
EOS (ABSOLUTE): 0.1 10*3/uL (ref 0.0–0.4)
Eos: 2 %
Hematocrit: 40.1 % (ref 34.0–46.6)
Hemoglobin: 13.1 g/dL (ref 11.1–15.9)
IMMATURE GRANULOCYTES: 0 %
Immature Grans (Abs): 0 10*3/uL (ref 0.0–0.1)
LYMPHS ABS: 2.9 10*3/uL (ref 0.7–3.1)
Lymphs: 31 %
MCH: 33.3 pg — ABNORMAL HIGH (ref 26.6–33.0)
MCHC: 32.7 g/dL (ref 31.5–35.7)
MCV: 102 fL — ABNORMAL HIGH (ref 79–97)
MONOS ABS: 1 10*3/uL — AB (ref 0.1–0.9)
Monocytes: 11 %
NEUTROS PCT: 55 %
Neutrophils Absolute: 5.1 10*3/uL (ref 1.4–7.0)
PLATELETS: 264 10*3/uL (ref 150–379)
RBC: 3.93 x10E6/uL (ref 3.77–5.28)
RDW: 12.7 % (ref 12.3–15.4)
WBC: 9.1 10*3/uL (ref 3.4–10.8)

## 2016-08-29 ENCOUNTER — Telehealth: Payer: Self-pay

## 2016-08-29 NOTE — Telephone Encounter (Signed)
Patient is aware and agreeable to normal results. 

## 2016-09-05 ENCOUNTER — Other Ambulatory Visit: Payer: Self-pay | Admitting: Internal Medicine

## 2016-09-05 DIAGNOSIS — Z1231 Encounter for screening mammogram for malignant neoplasm of breast: Secondary | ICD-10-CM

## 2016-09-29 ENCOUNTER — Ambulatory Visit
Admission: RE | Admit: 2016-09-29 | Discharge: 2016-09-29 | Disposition: A | Discharge status: Home / Self Care | Payer: Medicare Other | Source: Ambulatory Visit | Attending: Internal Medicine | Admitting: Internal Medicine

## 2016-09-29 DIAGNOSIS — Z1231 Encounter for screening mammogram for malignant neoplasm of breast: Secondary | ICD-10-CM

## 2016-11-27 ENCOUNTER — Encounter: Payer: Medicare Other | Admitting: *Deleted

## 2016-12-01 ENCOUNTER — Encounter: Payer: Self-pay | Admitting: Cardiology

## 2016-12-08 ENCOUNTER — Ambulatory Visit (INDEPENDENT_AMBULATORY_CARE_PROVIDER_SITE_OTHER): Payer: Medicare Other | Admitting: *Deleted

## 2016-12-08 ENCOUNTER — Telehealth: Payer: Self-pay | Admitting: Cardiology

## 2016-12-08 DIAGNOSIS — I442 Atrioventricular block, complete: Secondary | ICD-10-CM

## 2016-12-08 NOTE — Progress Notes (Signed)
Remote pacemaker transmission.   

## 2016-12-08 NOTE — Telephone Encounter (Signed)
LMOVM reminding pt to send remote transmission.   

## 2016-12-12 ENCOUNTER — Encounter: Payer: Self-pay | Admitting: Cardiology

## 2016-12-27 DIAGNOSIS — H01004 Unspecified blepharitis left upper eyelid: Secondary | ICD-10-CM | POA: Diagnosis not present

## 2016-12-27 DIAGNOSIS — H40243 Residual stage of angle-closure glaucoma, bilateral: Secondary | ICD-10-CM | POA: Diagnosis not present

## 2016-12-27 DIAGNOSIS — H01001 Unspecified blepharitis right upper eyelid: Secondary | ICD-10-CM | POA: Diagnosis not present

## 2016-12-27 DIAGNOSIS — Z961 Presence of intraocular lens: Secondary | ICD-10-CM | POA: Diagnosis not present

## 2016-12-29 LAB — CUP PACEART REMOTE DEVICE CHECK
Brady Statistic AP VP Percent: 1 %
Brady Statistic AP VS Percent: 14 %
Brady Statistic AS VP Percent: 1 %
Brady Statistic AS VS Percent: 85 %
Date Time Interrogation Session: 20180907141604
Implantable Lead Implant Date: 20130110
Implantable Lead Location: 753860
Implantable Pulse Generator Implant Date: 20130110
Lead Channel Impedance Value: 350 Ohm
Lead Channel Impedance Value: 380 Ohm
Lead Channel Pacing Threshold Amplitude: 1.25 V
Lead Channel Pacing Threshold Pulse Width: 0.4 ms
Lead Channel Sensing Intrinsic Amplitude: 12 mV
Lead Channel Setting Pacing Amplitude: 2 V
Lead Channel Setting Pacing Amplitude: 2.5 V
Lead Channel Setting Pacing Pulse Width: 0.4 ms
MDC IDC LEAD IMPLANT DT: 20130110
MDC IDC LEAD LOCATION: 753859
MDC IDC MSMT BATTERY REMAINING LONGEVITY: 99 mo
MDC IDC MSMT BATTERY REMAINING PERCENTAGE: 81 %
MDC IDC MSMT BATTERY VOLTAGE: 2.93 V
MDC IDC MSMT LEADCHNL RA PACING THRESHOLD AMPLITUDE: 0.5 V
MDC IDC MSMT LEADCHNL RA PACING THRESHOLD PULSEWIDTH: 0.4 ms
MDC IDC MSMT LEADCHNL RA SENSING INTR AMPL: 4.1 mV
MDC IDC SET LEADCHNL RV SENSING SENSITIVITY: 4.5 mV
MDC IDC STAT BRADY RA PERCENT PACED: 13 %
MDC IDC STAT BRADY RV PERCENT PACED: 1 %
Pulse Gen Model: 2210
Pulse Gen Serial Number: 7303108

## 2017-01-31 DIAGNOSIS — R82998 Other abnormal findings in urine: Secondary | ICD-10-CM | POA: Diagnosis not present

## 2017-01-31 DIAGNOSIS — E559 Vitamin D deficiency, unspecified: Secondary | ICD-10-CM | POA: Diagnosis not present

## 2017-01-31 DIAGNOSIS — E7800 Pure hypercholesterolemia, unspecified: Secondary | ICD-10-CM | POA: Diagnosis not present

## 2017-01-31 DIAGNOSIS — N183 Chronic kidney disease, stage 3 (moderate): Secondary | ICD-10-CM | POA: Diagnosis not present

## 2017-02-07 DIAGNOSIS — Z0000 Encounter for general adult medical examination without abnormal findings: Secondary | ICD-10-CM | POA: Diagnosis not present

## 2017-02-07 DIAGNOSIS — Z6821 Body mass index (BMI) 21.0-21.9, adult: Secondary | ICD-10-CM | POA: Diagnosis not present

## 2017-02-07 DIAGNOSIS — R002 Palpitations: Secondary | ICD-10-CM | POA: Diagnosis not present

## 2017-02-07 DIAGNOSIS — I341 Nonrheumatic mitral (valve) prolapse: Secondary | ICD-10-CM | POA: Diagnosis not present

## 2017-02-07 DIAGNOSIS — Z950 Presence of cardiac pacemaker: Secondary | ICD-10-CM | POA: Diagnosis not present

## 2017-02-07 DIAGNOSIS — Z1389 Encounter for screening for other disorder: Secondary | ICD-10-CM | POA: Diagnosis not present

## 2017-02-07 DIAGNOSIS — E7800 Pure hypercholesterolemia, unspecified: Secondary | ICD-10-CM | POA: Diagnosis not present

## 2017-02-07 DIAGNOSIS — D126 Benign neoplasm of colon, unspecified: Secondary | ICD-10-CM | POA: Diagnosis not present

## 2017-02-07 DIAGNOSIS — I480 Paroxysmal atrial fibrillation: Secondary | ICD-10-CM | POA: Diagnosis not present

## 2017-02-07 DIAGNOSIS — Z23 Encounter for immunization: Secondary | ICD-10-CM | POA: Diagnosis not present

## 2017-02-07 DIAGNOSIS — I1310 Hypertensive heart and chronic kidney disease without heart failure, with stage 1 through stage 4 chronic kidney disease, or unspecified chronic kidney disease: Secondary | ICD-10-CM | POA: Diagnosis not present

## 2017-02-07 DIAGNOSIS — N183 Chronic kidney disease, stage 3 (moderate): Secondary | ICD-10-CM | POA: Diagnosis not present

## 2017-02-08 DIAGNOSIS — Z1212 Encounter for screening for malignant neoplasm of rectum: Secondary | ICD-10-CM | POA: Diagnosis not present

## 2017-03-12 ENCOUNTER — Ambulatory Visit (INDEPENDENT_AMBULATORY_CARE_PROVIDER_SITE_OTHER): Payer: Medicare Other | Admitting: *Deleted

## 2017-03-12 DIAGNOSIS — I442 Atrioventricular block, complete: Secondary | ICD-10-CM

## 2017-03-12 NOTE — Progress Notes (Signed)
Remote pacemaker transmission.   

## 2017-03-16 ENCOUNTER — Encounter: Payer: Self-pay | Admitting: Cardiology

## 2017-03-20 LAB — CUP PACEART REMOTE DEVICE CHECK
Battery Remaining Longevity: 97 mo
Brady Statistic AS VP Percent: 1 %
Brady Statistic RA Percent Paced: 14 %
Brady Statistic RV Percent Paced: 1.4 %
Date Time Interrogation Session: 20181210085708
Implantable Lead Location: 753859
Implantable Pulse Generator Implant Date: 20130110
Lead Channel Impedance Value: 380 Ohm
Lead Channel Pacing Threshold Pulse Width: 0.4 ms
Lead Channel Pacing Threshold Pulse Width: 0.4 ms
Lead Channel Sensing Intrinsic Amplitude: 12 mV
Lead Channel Setting Pacing Amplitude: 2 V
Lead Channel Setting Sensing Sensitivity: 4.5 mV
MDC IDC LEAD IMPLANT DT: 20130110
MDC IDC LEAD IMPLANT DT: 20130110
MDC IDC LEAD LOCATION: 753860
MDC IDC MSMT BATTERY REMAINING PERCENTAGE: 81 %
MDC IDC MSMT BATTERY VOLTAGE: 2.93 V
MDC IDC MSMT LEADCHNL RA IMPEDANCE VALUE: 260 Ohm
MDC IDC MSMT LEADCHNL RA PACING THRESHOLD AMPLITUDE: 0.5 V
MDC IDC MSMT LEADCHNL RA SENSING INTR AMPL: 2.6 mV
MDC IDC MSMT LEADCHNL RV PACING THRESHOLD AMPLITUDE: 1.25 V
MDC IDC SET LEADCHNL RV PACING AMPLITUDE: 2.5 V
MDC IDC SET LEADCHNL RV PACING PULSEWIDTH: 0.4 ms
MDC IDC STAT BRADY AP VP PERCENT: 1 %
MDC IDC STAT BRADY AP VS PERCENT: 14 %
MDC IDC STAT BRADY AS VS PERCENT: 85 %
Pulse Gen Model: 2210
Pulse Gen Serial Number: 7303108

## 2017-06-11 ENCOUNTER — Ambulatory Visit (INDEPENDENT_AMBULATORY_CARE_PROVIDER_SITE_OTHER): Payer: Medicare Other | Admitting: *Deleted

## 2017-06-11 DIAGNOSIS — I442 Atrioventricular block, complete: Secondary | ICD-10-CM | POA: Diagnosis not present

## 2017-06-11 NOTE — Progress Notes (Signed)
Remote pacemaker transmission.   

## 2017-06-13 ENCOUNTER — Encounter: Payer: Self-pay | Admitting: Cardiology

## 2017-06-15 DIAGNOSIS — L821 Other seborrheic keratosis: Secondary | ICD-10-CM | POA: Diagnosis not present

## 2017-06-15 DIAGNOSIS — Z85828 Personal history of other malignant neoplasm of skin: Secondary | ICD-10-CM | POA: Diagnosis not present

## 2017-06-15 DIAGNOSIS — Z85820 Personal history of malignant melanoma of skin: Secondary | ICD-10-CM | POA: Diagnosis not present

## 2017-06-27 DIAGNOSIS — Z961 Presence of intraocular lens: Secondary | ICD-10-CM | POA: Diagnosis not present

## 2017-06-27 DIAGNOSIS — H0100A Unspecified blepharitis right eye, upper and lower eyelids: Secondary | ICD-10-CM | POA: Diagnosis not present

## 2017-06-27 DIAGNOSIS — H0100B Unspecified blepharitis left eye, upper and lower eyelids: Secondary | ICD-10-CM | POA: Diagnosis not present

## 2017-06-30 LAB — CUP PACEART REMOTE DEVICE CHECK
Battery Remaining Longevity: 98 mo
Battery Remaining Percentage: 81 %
Battery Voltage: 2.93 V
Brady Statistic AP VP Percent: 1 %
Brady Statistic AS VS Percent: 85 %
Brady Statistic RV Percent Paced: 1.6 %
Implantable Lead Implant Date: 20130110
Implantable Pulse Generator Implant Date: 20130110
Lead Channel Impedance Value: 360 Ohm
Lead Channel Pacing Threshold Amplitude: 0.5 V
Lead Channel Pacing Threshold Pulse Width: 0.4 ms
Lead Channel Sensing Intrinsic Amplitude: 2.7 mV
Lead Channel Setting Sensing Sensitivity: 4.5 mV
MDC IDC LEAD IMPLANT DT: 20130110
MDC IDC LEAD LOCATION: 753859
MDC IDC LEAD LOCATION: 753860
MDC IDC MSMT LEADCHNL RA IMPEDANCE VALUE: 340 Ohm
MDC IDC MSMT LEADCHNL RA PACING THRESHOLD PULSEWIDTH: 0.4 ms
MDC IDC MSMT LEADCHNL RV PACING THRESHOLD AMPLITUDE: 1.25 V
MDC IDC MSMT LEADCHNL RV SENSING INTR AMPL: 12 mV
MDC IDC PG SERIAL: 7303108
MDC IDC SESS DTM: 20190311072640
MDC IDC SET LEADCHNL RA PACING AMPLITUDE: 2 V
MDC IDC SET LEADCHNL RV PACING AMPLITUDE: 2.5 V
MDC IDC SET LEADCHNL RV PACING PULSEWIDTH: 0.4 ms
MDC IDC STAT BRADY AP VS PERCENT: 14 %
MDC IDC STAT BRADY AS VP PERCENT: 1.1 %
MDC IDC STAT BRADY RA PERCENT PACED: 13 %

## 2017-08-08 DIAGNOSIS — N183 Chronic kidney disease, stage 3 (moderate): Secondary | ICD-10-CM | POA: Diagnosis not present

## 2017-08-08 DIAGNOSIS — M810 Age-related osteoporosis without current pathological fracture: Secondary | ICD-10-CM | POA: Diagnosis not present

## 2017-08-08 DIAGNOSIS — D126 Benign neoplasm of colon, unspecified: Secondary | ICD-10-CM | POA: Diagnosis not present

## 2017-08-08 DIAGNOSIS — Z950 Presence of cardiac pacemaker: Secondary | ICD-10-CM | POA: Diagnosis not present

## 2017-08-08 DIAGNOSIS — Z6821 Body mass index (BMI) 21.0-21.9, adult: Secondary | ICD-10-CM | POA: Diagnosis not present

## 2017-08-08 DIAGNOSIS — I10 Essential (primary) hypertension: Secondary | ICD-10-CM | POA: Diagnosis not present

## 2017-08-08 DIAGNOSIS — I480 Paroxysmal atrial fibrillation: Secondary | ICD-10-CM | POA: Diagnosis not present

## 2017-08-08 DIAGNOSIS — I341 Nonrheumatic mitral (valve) prolapse: Secondary | ICD-10-CM | POA: Diagnosis not present

## 2017-08-08 DIAGNOSIS — M1990 Unspecified osteoarthritis, unspecified site: Secondary | ICD-10-CM | POA: Diagnosis not present

## 2017-08-08 DIAGNOSIS — R002 Palpitations: Secondary | ICD-10-CM | POA: Diagnosis not present

## 2017-08-08 DIAGNOSIS — I951 Orthostatic hypotension: Secondary | ICD-10-CM | POA: Diagnosis not present

## 2017-08-08 DIAGNOSIS — I1310 Hypertensive heart and chronic kidney disease without heart failure, with stage 1 through stage 4 chronic kidney disease, or unspecified chronic kidney disease: Secondary | ICD-10-CM | POA: Diagnosis not present

## 2017-08-09 ENCOUNTER — Ambulatory Visit (INDEPENDENT_AMBULATORY_CARE_PROVIDER_SITE_OTHER): Payer: Medicare Other | Admitting: Internal Medicine

## 2017-08-09 ENCOUNTER — Encounter: Payer: Self-pay | Admitting: Internal Medicine

## 2017-08-09 ENCOUNTER — Encounter (INDEPENDENT_AMBULATORY_CARE_PROVIDER_SITE_OTHER): Payer: Self-pay

## 2017-08-09 VITALS — BP 156/70 | HR 76 | Ht 62.0 in | Wt 116.0 lb

## 2017-08-09 DIAGNOSIS — I480 Paroxysmal atrial fibrillation: Secondary | ICD-10-CM

## 2017-08-09 DIAGNOSIS — Z950 Presence of cardiac pacemaker: Secondary | ICD-10-CM

## 2017-08-09 DIAGNOSIS — I442 Atrioventricular block, complete: Secondary | ICD-10-CM | POA: Diagnosis not present

## 2017-08-09 NOTE — Progress Notes (Signed)
Patient Care Team: Tisovec, Fransico Him, MD as PCP - General (Internal Medicine)   HPI  Joy Newman is a 84 y.o. female Seen in followup for pacemaker implantation January 13 for syncope and intermittent complete heart block. She had carotid sinus hypersensitivity and antecedent PR prolongation suggestive of a neurally mediated trigger. Remote history of myocardial infarction and ischemic heart disease.   Echo 2013 demonstrated normal left ventricular function   She told me about her support of the [employer] [facility] in [place] received a reward for her effort; htat is now complete  The patient denies chest pain, shortness of breath, nocturnal dyspnea, orthopnea or peripheral edema.  There have been no palpitations, lightheadedness or syncope.      Past Medical History:  Diagnosis Date  . Arteriovenous malformation of colon   . Basal cell carcinoma   . CAD (coronary artery disease) no obstruction at cath    Hx of with an anteroapical M.I  . Carotid sinus hypersensitivity   . Complete heart block-intermittent   . Diastolic dysfunction   . Diverticulosis   . Hyperlipidemia   . Hypertension   . Kidney stones   . Melanoma ([place])   . MVP (mitral valve prolapse)   . Myocardial infarction ([place])    2005  . Orthostasis   . Pacemaker    St Jude  . Pulmonary hypertension (HCC)     Hx of Mild  . PVC and PACs   . Squamous carcinoma   . Syncope   . Tricuspid regurgitation    Moderate regurgitation  . Tubular adenoma of colon 02/2012    Past Surgical History:  Procedure Laterality Date  . ABDOMINAL HYSTERECTOMY  1980   Hx of  . APPENDECTOMY  1954   Hx of  . CARDIAC CATHETERIZATION     Revealed an apical wall motion abnormality  . CARDIOVASCULAR STRESS TEST  02-21-2006   EF 84%, which revealed an apical defect  . NASAL RECONSTRUCTION  1981  . PACEMAKER INSERTION    . PERMANENT PACEMAKER INSERTION N/A 04/13/2011   Procedure: PERMANENT PACEMAKER  INSERTION;  Surgeon: Evans Lance, MD;  Location: [facility] CATH LAB;  Service: Cardiovascular;  Laterality: N/A;  . SKIN CANCER EXCISION     several, basal cell, squamous, melanoma  . TONSILLECTOMY  1971   Hx of    Current Outpatient Medications  Medication Sig Dispense Refill  . apixaban (ELIQUIS) 2.5 MG TABS tablet Take 1 tablet (2.5 mg total) by mouth 2 (two) times daily. Re-tart this medicine on 11/27/14 60 tablet 11  . Ascorbic Acid (VITAMIN C) 1000 MG tablet Take 1,000 mg by mouth daily.     Marland Kitchen BIOTIN PO Take 600 mg by mouth 2 (two) times daily.     . Calcium Carb-Cholecalciferol (CALCIUM 600 + D PO) Take 600 mg by mouth 2 (two) times daily.    . Cholecalciferol (VITAMIN D) 2000 UNITS tablet Take 2,000 Units by mouth daily. VITAMIN D3 2000 MG    . diltiazem (CARDIZEM CD) 180 MG 24 hr capsule Take 1 capsule (180 mg total) by mouth every morning.    . Multiple Vitamin (MULTIVITAMIN WITH MINERALS) TABS tablet Take 1 tablet by mouth daily.    . niacinamide 500 MG tablet Take 500 mg by mouth 2 (two) times daily with a meal.    . OVER THE COUNTER MEDICATION Place 1 drop into both eyes daily. OTC eye drops for dry eyes    . pantoprazole (PROTONIX)  40 MG tablet Take 40 mg by mouth daily.     . rosuvastatin (CRESTOR) 5 MG tablet Take 5 mg by mouth daily.     No current facility-administered medications for this visit.     Allergies  Allergen Reactions  . Imdur [Isosorbide Mononitrate]     Intolerant and cause headaches and nausea  . Polysporin [Bacitracin-Polymyxin B]     Rash  . Quinine Derivatives     Increase in Heart Rate and Decrease in BP   . Tetracyclines & Related     Rash    Review of Systems negative except from HPI and PMH  Physical Exam BP (!) 156/70   Pulse 76   Ht 5\' 2"  (1.575 m)   Wt 116 lb (52.6 kg)   SpO2 97%   BMI 21.22 kg/m  Well developed and nourished in no acute distress HENT normal Neck supple with JVP-flat Clear Regular rate and rhythm, no murmurs or  gallops Abd-soft with active BS No Clubbing cyanosis edema Skin-warm and dry A & Oriented  Grossly normal sensory and motor function    ECG  Sinus @ 76   Assessment and  Plan  Atrial fibrillation-paroxysmal  Pacemaker-St. Jude  The patient's device was interrogated.  The information was reviewed. No changes were made in the programming.       Syncope-carotid sinus hypersensitivity  Intermittent complete heart block  Nose reversion-atrial/ventricular lead  infreq AFib  No syncope  .

## 2017-08-09 NOTE — Patient Instructions (Signed)
Medication Instructions:  Your physician recommends that you continue on your current medications as directed. Please refer to the Current Medication list given to you today.  Labwork: None ordered.  Testing/Procedures: None ordered.  Follow-Up: Your physician wants you to follow-up in: One Year with Dr Caryl Comes. You will receive a reminder letter in the mail two months in advance. If you don't receive a letter, please call our office to schedule the follow-up appointment.   Remote monitoring is used to monitor your Pacemaker from home. This monitoring reduces the number of office visits required to check your device to one time per year. It allows [place] to keep an eye on the functioning of your device to ensure it is working properly. You are scheduled for a device check from home on 09/10/2017. You may send your transmission at any time that day. If you have a wireless device, the transmission will be sent automatically. After your physician reviews your transmission, you will receive a postcard with your next transmission date.    Any Other Special Instructions Will Be Listed Below (If Applicable).     If you need a refill on your cardiac medications before your next appointment, please call your pharmacy.

## 2017-08-10 LAB — CUP PACEART INCLINIC DEVICE CHECK
Battery Remaining Longevity: 100 mo
Brady Statistic RA Percent Paced: 0 %
Brady Statistic RV Percent Paced: 1 %
Implantable Lead Implant Date: 20130110
Implantable Lead Location: 753859
Lead Channel Impedance Value: 437.5 Ohm
Lead Channel Pacing Threshold Amplitude: 0.5 V
Lead Channel Pacing Threshold Amplitude: 1.25 V
Lead Channel Pacing Threshold Pulse Width: 0.4 ms
Lead Channel Pacing Threshold Pulse Width: 0.4 ms
Lead Channel Pacing Threshold Pulse Width: 0.4 ms
Lead Channel Sensing Intrinsic Amplitude: 12 mV
Lead Channel Setting Pacing Amplitude: 2 V
Lead Channel Setting Pacing Amplitude: 2.5 V
Lead Channel Setting Pacing Pulse Width: 0.4 ms
Lead Channel Setting Sensing Sensitivity: 4.5 mV
MDC IDC LEAD IMPLANT DT: 20130110
MDC IDC LEAD LOCATION: 753860
MDC IDC MSMT BATTERY VOLTAGE: 2.92 V
MDC IDC MSMT LEADCHNL RA IMPEDANCE VALUE: 375 Ohm
MDC IDC MSMT LEADCHNL RA PACING THRESHOLD AMPLITUDE: 0.5 V
MDC IDC MSMT LEADCHNL RA SENSING INTR AMPL: 3 mV
MDC IDC MSMT LEADCHNL RV PACING THRESHOLD AMPLITUDE: 1.25 V
MDC IDC MSMT LEADCHNL RV PACING THRESHOLD PULSEWIDTH: 0.4 ms
MDC IDC PG IMPLANT DT: 20130110
MDC IDC PG SERIAL: 7303108
MDC IDC SESS DTM: 20190509184709

## 2017-09-10 ENCOUNTER — Ambulatory Visit (INDEPENDENT_AMBULATORY_CARE_PROVIDER_SITE_OTHER): Payer: Medicare Other | Admitting: *Deleted

## 2017-09-10 DIAGNOSIS — I442 Atrioventricular block, complete: Secondary | ICD-10-CM | POA: Diagnosis not present

## 2017-09-10 NOTE — Progress Notes (Signed)
Remote pacemaker transmission.   

## 2017-09-11 ENCOUNTER — Encounter: Payer: Self-pay | Admitting: Cardiology

## 2017-09-25 ENCOUNTER — Other Ambulatory Visit: Payer: Self-pay | Admitting: Internal Medicine

## 2017-09-25 DIAGNOSIS — Z1231 Encounter for screening mammogram for malignant neoplasm of breast: Secondary | ICD-10-CM

## 2017-09-25 LAB — CUP PACEART REMOTE DEVICE CHECK
Brady Statistic AP VS Percent: 12 %
Brady Statistic AS VP Percent: 1.7 %
Brady Statistic RA Percent Paced: 10 %
Brady Statistic RV Percent Paced: 2.6 %
Date Time Interrogation Session: 20190610143415
Implantable Lead Implant Date: 20130110
Implantable Lead Location: 753859
Lead Channel Impedance Value: 350 Ohm
Lead Channel Pacing Threshold Amplitude: 1.25 V
Lead Channel Pacing Threshold Pulse Width: 0.4 ms
Lead Channel Sensing Intrinsic Amplitude: 12 mV
Lead Channel Setting Pacing Amplitude: 2 V
Lead Channel Setting Pacing Pulse Width: 0.4 ms
MDC IDC LEAD IMPLANT DT: 20130110
MDC IDC LEAD LOCATION: 753860
MDC IDC MSMT BATTERY REMAINING LONGEVITY: 89 mo
MDC IDC MSMT BATTERY REMAINING PERCENTAGE: 73 %
MDC IDC MSMT BATTERY VOLTAGE: 2.92 V
MDC IDC MSMT LEADCHNL RA PACING THRESHOLD AMPLITUDE: 0.5 V
MDC IDC MSMT LEADCHNL RA SENSING INTR AMPL: 3.6 mV
MDC IDC MSMT LEADCHNL RV IMPEDANCE VALUE: 400 Ohm
MDC IDC MSMT LEADCHNL RV PACING THRESHOLD PULSEWIDTH: 0.4 ms
MDC IDC PG IMPLANT DT: 20130110
MDC IDC SET LEADCHNL RV PACING AMPLITUDE: 2.5 V
MDC IDC SET LEADCHNL RV SENSING SENSITIVITY: 4.5 mV
MDC IDC STAT BRADY AP VP PERCENT: 1 %
MDC IDC STAT BRADY AS VS PERCENT: 84 %
Pulse Gen Model: 2210
Pulse Gen Serial Number: 7303108

## 2017-10-15 ENCOUNTER — Ambulatory Visit
Admission: RE | Admit: 2017-10-15 | Discharge: 2017-10-15 | Disposition: A | Discharge status: Home / Self Care | Payer: Medicare Other | Source: Ambulatory Visit | Attending: Internal Medicine | Admitting: Internal Medicine

## 2017-10-15 DIAGNOSIS — Z1231 Encounter for screening mammogram for malignant neoplasm of breast: Secondary | ICD-10-CM | POA: Diagnosis not present

## 2017-12-10 ENCOUNTER — Ambulatory Visit (INDEPENDENT_AMBULATORY_CARE_PROVIDER_SITE_OTHER): Payer: Medicare Other | Admitting: *Deleted

## 2017-12-10 DIAGNOSIS — I442 Atrioventricular block, complete: Secondary | ICD-10-CM

## 2017-12-10 NOTE — Progress Notes (Signed)
Remote pacemaker transmission.   

## 2017-12-13 DIAGNOSIS — Z23 Encounter for immunization: Secondary | ICD-10-CM | POA: Diagnosis not present

## 2018-01-01 LAB — CUP PACEART REMOTE DEVICE CHECK
Battery Remaining Longevity: 89 mo
Battery Remaining Percentage: 73 %
Battery Voltage: 2.92 V
Brady Statistic AP VP Percent: 1.5 %
Brady Statistic AS VS Percent: 77 %
Implantable Lead Implant Date: 20130110
Implantable Pulse Generator Implant Date: 20130110
Lead Channel Impedance Value: 360 Ohm
Lead Channel Impedance Value: 410 Ohm
Lead Channel Pacing Threshold Amplitude: 0.5 V
Lead Channel Pacing Threshold Pulse Width: 0.4 ms
Lead Channel Sensing Intrinsic Amplitude: 2.6 mV
Lead Channel Setting Sensing Sensitivity: 4.5 mV
MDC IDC LEAD IMPLANT DT: 20130110
MDC IDC LEAD LOCATION: 753859
MDC IDC LEAD LOCATION: 753860
MDC IDC MSMT LEADCHNL RA PACING THRESHOLD PULSEWIDTH: 0.4 ms
MDC IDC MSMT LEADCHNL RV PACING THRESHOLD AMPLITUDE: 1.25 V
MDC IDC MSMT LEADCHNL RV SENSING INTR AMPL: 12 mV
MDC IDC PG SERIAL: 7303108
MDC IDC SESS DTM: 20190909060009
MDC IDC SET LEADCHNL RA PACING AMPLITUDE: 2 V
MDC IDC SET LEADCHNL RV PACING AMPLITUDE: 2.5 V
MDC IDC SET LEADCHNL RV PACING PULSEWIDTH: 0.4 ms
MDC IDC STAT BRADY AP VS PERCENT: 18 %
MDC IDC STAT BRADY AS VP PERCENT: 2.1 %
MDC IDC STAT BRADY RA PERCENT PACED: 17 %
MDC IDC STAT BRADY RV PERCENT PACED: 3.7 %

## 2018-01-09 DIAGNOSIS — H40243 Residual stage of angle-closure glaucoma, bilateral: Secondary | ICD-10-CM | POA: Diagnosis not present

## 2018-01-09 DIAGNOSIS — H353131 Nonexudative age-related macular degeneration, bilateral, early dry stage: Secondary | ICD-10-CM | POA: Diagnosis not present

## 2018-01-09 DIAGNOSIS — H02055 Trichiasis without entropian left lower eyelid: Secondary | ICD-10-CM | POA: Diagnosis not present

## 2018-01-09 DIAGNOSIS — H524 Presbyopia: Secondary | ICD-10-CM | POA: Diagnosis not present

## 2018-02-06 DIAGNOSIS — R82998 Other abnormal findings in urine: Secondary | ICD-10-CM | POA: Diagnosis not present

## 2018-02-06 DIAGNOSIS — I10 Essential (primary) hypertension: Secondary | ICD-10-CM | POA: Diagnosis not present

## 2018-02-06 DIAGNOSIS — E7800 Pure hypercholesterolemia, unspecified: Secondary | ICD-10-CM | POA: Diagnosis not present

## 2018-02-06 DIAGNOSIS — E559 Vitamin D deficiency, unspecified: Secondary | ICD-10-CM | POA: Diagnosis not present

## 2018-02-15 DIAGNOSIS — I1310 Hypertensive heart and chronic kidney disease without heart failure, with stage 1 through stage 4 chronic kidney disease, or unspecified chronic kidney disease: Secondary | ICD-10-CM | POA: Diagnosis not present

## 2018-02-15 DIAGNOSIS — E559 Vitamin D deficiency, unspecified: Secondary | ICD-10-CM | POA: Diagnosis not present

## 2018-02-15 DIAGNOSIS — M810 Age-related osteoporosis without current pathological fracture: Secondary | ICD-10-CM | POA: Diagnosis not present

## 2018-02-15 DIAGNOSIS — I480 Paroxysmal atrial fibrillation: Secondary | ICD-10-CM | POA: Diagnosis not present

## 2018-02-15 DIAGNOSIS — Z950 Presence of cardiac pacemaker: Secondary | ICD-10-CM | POA: Diagnosis not present

## 2018-02-15 DIAGNOSIS — Z6820 Body mass index (BMI) 20.0-20.9, adult: Secondary | ICD-10-CM | POA: Diagnosis not present

## 2018-02-15 DIAGNOSIS — I10 Essential (primary) hypertension: Secondary | ICD-10-CM | POA: Diagnosis not present

## 2018-02-15 DIAGNOSIS — I951 Orthostatic hypotension: Secondary | ICD-10-CM | POA: Diagnosis not present

## 2018-02-15 DIAGNOSIS — Z1389 Encounter for screening for other disorder: Secondary | ICD-10-CM | POA: Diagnosis not present

## 2018-02-15 DIAGNOSIS — E7800 Pure hypercholesterolemia, unspecified: Secondary | ICD-10-CM | POA: Diagnosis not present

## 2018-02-15 DIAGNOSIS — N183 Chronic kidney disease, stage 3 (moderate): Secondary | ICD-10-CM | POA: Diagnosis not present

## 2018-02-15 DIAGNOSIS — Z0000 Encounter for general adult medical examination without abnormal findings: Secondary | ICD-10-CM | POA: Diagnosis not present

## 2018-03-11 ENCOUNTER — Ambulatory Visit (INDEPENDENT_AMBULATORY_CARE_PROVIDER_SITE_OTHER): Payer: Medicare Other

## 2018-03-11 DIAGNOSIS — I442 Atrioventricular block, complete: Secondary | ICD-10-CM | POA: Diagnosis not present

## 2018-03-12 NOTE — Progress Notes (Signed)
Remote pacemaker transmission.   

## 2018-04-27 LAB — CUP PACEART REMOTE DEVICE CHECK
Battery Remaining Percentage: 73 %
Battery Voltage: 2.92 V
Brady Statistic AP VP Percent: 2 %
Brady Statistic AS VP Percent: 2.5 %
Brady Statistic AS VS Percent: 76 %
Implantable Lead Implant Date: 20130110
Implantable Lead Location: 753859
Implantable Lead Location: 753860
Implantable Pulse Generator Implant Date: 20130110
Lead Channel Impedance Value: 410 Ohm
Lead Channel Pacing Threshold Amplitude: 0.5 V
Lead Channel Pacing Threshold Pulse Width: 0.4 ms
Lead Channel Pacing Threshold Pulse Width: 0.4 ms
Lead Channel Sensing Intrinsic Amplitude: 12 mV
Lead Channel Sensing Intrinsic Amplitude: 2.9 mV
Lead Channel Setting Pacing Amplitude: 2.5 V
Lead Channel Setting Sensing Sensitivity: 4.5 mV
MDC IDC LEAD IMPLANT DT: 20130110
MDC IDC MSMT BATTERY REMAINING LONGEVITY: 88 mo
MDC IDC MSMT LEADCHNL RA IMPEDANCE VALUE: 360 Ohm
MDC IDC MSMT LEADCHNL RV PACING THRESHOLD AMPLITUDE: 1.25 V
MDC IDC PG SERIAL: 7303108
MDC IDC SESS DTM: 20191209081039
MDC IDC SET LEADCHNL RA PACING AMPLITUDE: 2 V
MDC IDC SET LEADCHNL RV PACING PULSEWIDTH: 0.4 ms
MDC IDC STAT BRADY AP VS PERCENT: 18 %
MDC IDC STAT BRADY RA PERCENT PACED: 17 %
MDC IDC STAT BRADY RV PERCENT PACED: 5.7 %

## 2018-05-04 DIAGNOSIS — W19XXXA Unspecified fall, initial encounter: Secondary | ICD-10-CM

## 2018-05-04 HISTORY — DX: Unspecified fall, initial encounter: W19.XXXA

## 2018-05-14 ENCOUNTER — Encounter (HOSPITAL_COMMUNITY): Payer: Self-pay | Admitting: Emergency Medicine

## 2018-05-14 ENCOUNTER — Observation Stay (HOSPITAL_COMMUNITY)
Admission: EM | Admit: 2018-05-14 | Discharge: 2018-05-15 | Disposition: A | Discharge status: Home / Self Care | Payer: Medicare Other | Attending: Internal Medicine | Admitting: Internal Medicine

## 2018-05-14 ENCOUNTER — Other Ambulatory Visit: Payer: Self-pay

## 2018-05-14 ENCOUNTER — Emergency Department (HOSPITAL_COMMUNITY): Payer: Medicare Other

## 2018-05-14 DIAGNOSIS — E785 Hyperlipidemia, unspecified: Secondary | ICD-10-CM | POA: Diagnosis not present

## 2018-05-14 DIAGNOSIS — Z7901 Long term (current) use of anticoagulants: Secondary | ICD-10-CM | POA: Insufficient documentation

## 2018-05-14 DIAGNOSIS — Z8249 Family history of ischemic heart disease and other diseases of the circulatory system: Secondary | ICD-10-CM | POA: Diagnosis not present

## 2018-05-14 DIAGNOSIS — I442 Atrioventricular block, complete: Secondary | ICD-10-CM | POA: Diagnosis not present

## 2018-05-14 DIAGNOSIS — I351 Nonrheumatic aortic (valve) insufficiency: Secondary | ICD-10-CM | POA: Insufficient documentation

## 2018-05-14 DIAGNOSIS — Z45018 Encounter for adjustment and management of other part of cardiac pacemaker: Secondary | ICD-10-CM | POA: Insufficient documentation

## 2018-05-14 DIAGNOSIS — I480 Paroxysmal atrial fibrillation: Secondary | ICD-10-CM | POA: Diagnosis not present

## 2018-05-14 DIAGNOSIS — W19XXXA Unspecified fall, initial encounter: Secondary | ICD-10-CM | POA: Diagnosis not present

## 2018-05-14 DIAGNOSIS — S199XXA Unspecified injury of neck, initial encounter: Secondary | ICD-10-CM | POA: Diagnosis not present

## 2018-05-14 DIAGNOSIS — I491 Atrial premature depolarization: Secondary | ICD-10-CM | POA: Diagnosis not present

## 2018-05-14 DIAGNOSIS — S299XXA Unspecified injury of thorax, initial encounter: Secondary | ICD-10-CM | POA: Diagnosis not present

## 2018-05-14 DIAGNOSIS — G9001 Carotid sinus syncope: Secondary | ICD-10-CM | POA: Diagnosis present

## 2018-05-14 DIAGNOSIS — M25551 Pain in right hip: Secondary | ICD-10-CM | POA: Diagnosis not present

## 2018-05-14 DIAGNOSIS — D72829 Elevated white blood cell count, unspecified: Secondary | ICD-10-CM | POA: Diagnosis not present

## 2018-05-14 DIAGNOSIS — Z888 Allergy status to other drugs, medicaments and biological substances status: Secondary | ICD-10-CM | POA: Diagnosis not present

## 2018-05-14 DIAGNOSIS — Z79899 Other long term (current) drug therapy: Secondary | ICD-10-CM | POA: Diagnosis not present

## 2018-05-14 DIAGNOSIS — S0990XA Unspecified injury of head, initial encounter: Secondary | ICD-10-CM | POA: Diagnosis not present

## 2018-05-14 DIAGNOSIS — I2510 Atherosclerotic heart disease of native coronary artery without angina pectoris: Secondary | ICD-10-CM | POA: Diagnosis present

## 2018-05-14 DIAGNOSIS — I119 Hypertensive heart disease without heart failure: Secondary | ICD-10-CM | POA: Insufficient documentation

## 2018-05-14 DIAGNOSIS — I252 Old myocardial infarction: Secondary | ICD-10-CM | POA: Insufficient documentation

## 2018-05-14 DIAGNOSIS — M25552 Pain in left hip: Secondary | ICD-10-CM | POA: Diagnosis not present

## 2018-05-14 DIAGNOSIS — S79912A Unspecified injury of left hip, initial encounter: Secondary | ICD-10-CM | POA: Diagnosis not present

## 2018-05-14 DIAGNOSIS — R9082 White matter disease, unspecified: Secondary | ICD-10-CM | POA: Insufficient documentation

## 2018-05-14 DIAGNOSIS — R55 Syncope and collapse: Principal | ICD-10-CM

## 2018-05-14 DIAGNOSIS — S0993XA Unspecified injury of face, initial encounter: Secondary | ICD-10-CM | POA: Diagnosis not present

## 2018-05-14 DIAGNOSIS — S79911A Unspecified injury of right hip, initial encounter: Secondary | ICD-10-CM | POA: Diagnosis not present

## 2018-05-14 DIAGNOSIS — I10 Essential (primary) hypertension: Secondary | ICD-10-CM | POA: Diagnosis present

## 2018-05-14 LAB — CBC WITH DIFFERENTIAL/PLATELET
Abs Immature Granulocytes: 0.04 10*3/uL (ref 0.00–0.07)
BASOS ABS: 0.1 10*3/uL (ref 0.0–0.1)
BASOS PCT: 1 %
Eosinophils Absolute: 0.1 10*3/uL (ref 0.0–0.5)
Eosinophils Relative: 1 %
HCT: 46.8 % — ABNORMAL HIGH (ref 36.0–46.0)
Hemoglobin: 14.8 g/dL (ref 12.0–15.0)
IMMATURE GRANULOCYTES: 0 %
LYMPHS ABS: 2 10*3/uL (ref 0.7–4.0)
Lymphocytes Relative: 16 %
MCH: 32.2 pg (ref 26.0–34.0)
MCHC: 31.6 g/dL (ref 30.0–36.0)
MCV: 101.7 fL — ABNORMAL HIGH (ref 80.0–100.0)
Monocytes Absolute: 1 10*3/uL (ref 0.1–1.0)
Monocytes Relative: 8 %
NEUTROS ABS: 9.8 10*3/uL — AB (ref 1.7–7.7)
NEUTROS PCT: 74 %
NRBC: 0 % (ref 0.0–0.2)
PLATELETS: 267 10*3/uL (ref 150–400)
RBC: 4.6 MIL/uL (ref 3.87–5.11)
RDW: 12.8 % (ref 11.5–15.5)
WBC: 13 10*3/uL — ABNORMAL HIGH (ref 4.0–10.5)

## 2018-05-14 LAB — COMPREHENSIVE METABOLIC PANEL
ALT: 17 U/L (ref 0–44)
AST: 33 U/L (ref 15–41)
Albumin: 5.2 g/dL — ABNORMAL HIGH (ref 3.5–5.0)
Alkaline Phosphatase: 66 U/L (ref 38–126)
Anion gap: 11 (ref 5–15)
BILIRUBIN TOTAL: 0.5 mg/dL (ref 0.3–1.2)
BUN: 19 mg/dL (ref 8–23)
CHLORIDE: 100 mmol/L (ref 98–111)
CO2: 30 mmol/L (ref 22–32)
CREATININE: 0.89 mg/dL (ref 0.44–1.00)
Calcium: 9.5 mg/dL (ref 8.9–10.3)
GFR calc Af Amer: >60 mL/min (ref >60)
GFR, EST NON AFRICAN AMERICAN: 58 mL/min — AB (ref >60)
Glucose, Bld: 105 mg/dL — ABNORMAL HIGH (ref 70–99)
Potassium: 4.1 mmol/L (ref 3.5–5.1)
Sodium: 141 mmol/L (ref 135–145)
TOTAL PROTEIN: 7.9 g/dL (ref 6.5–8.1)

## 2018-05-14 LAB — URINALYSIS, ROUTINE W REFLEX MICROSCOPIC
BILIRUBIN URINE: NEGATIVE
GLUCOSE, UA: NEGATIVE mg/dL
HGB URINE DIPSTICK: NEGATIVE
KETONES UR: NEGATIVE mg/dL
NITRITE: NEGATIVE
Protein, ur: NEGATIVE mg/dL
Specific Gravity, Urine: 1.005 (ref 1.005–1.030)
pH: 7 (ref 5.0–8.0)

## 2018-05-14 LAB — TROPONIN I
Troponin I: <0.03 ng/mL (ref <0.03)
Troponin I: <0.03 ng/mL (ref <0.03)

## 2018-05-14 MED ORDER — PANTOPRAZOLE SODIUM 40 MG PO TBEC
40.0000 mg | DELAYED_RELEASE_TABLET | Freq: Every day | ORAL | Status: DC
  Administered 2018-05-15: 40 mg via ORAL
  Filled 2018-05-14: qty 1

## 2018-05-14 MED ORDER — BACITRACIN ZINC 500 UNIT/GM EX OINT
TOPICAL_OINTMENT | CUTANEOUS | Status: AC
  Administered 2018-05-14: 20:00:00
  Filled 2018-05-14: qty 1.8

## 2018-05-14 MED ORDER — ONDANSETRON HCL 4 MG PO TABS: 4.0000 mg | ORAL_TABLET | Freq: Four times a day (QID) | ORAL | Status: DC | PRN

## 2018-05-14 MED ORDER — SODIUM CHLORIDE 0.9% FLUSH
3.0000 mL | Freq: Two times a day (BID) | INTRAVENOUS | Status: DC
  Administered 2018-05-14: 3 mL via INTRAVENOUS

## 2018-05-14 MED ORDER — ACETAMINOPHEN 650 MG RE SUPP: 650.0000 mg | Freq: Four times a day (QID) | RECTAL | Status: DC | PRN

## 2018-05-14 MED ORDER — ACETAMINOPHEN 325 MG PO TABS: 650.0000 mg | ORAL_TABLET | Freq: Four times a day (QID) | ORAL | Status: DC | PRN

## 2018-05-14 MED ORDER — SODIUM CHLORIDE 0.9 % IV SOLN
INTRAVENOUS | Status: AC
  Administered 2018-05-14: 22:00:00 via INTRAVENOUS

## 2018-05-14 MED ORDER — ONDANSETRON HCL 4 MG/2ML IJ SOLN
4.0000 mg | Freq: Once | INTRAMUSCULAR | Status: AC
  Administered 2018-05-14: 4 mg via INTRAVENOUS
  Filled 2018-05-14: qty 2

## 2018-05-14 MED ORDER — HYDROCODONE-ACETAMINOPHEN 5-325 MG PO TABS: 1.0000 | ORAL_TABLET | ORAL | Status: DC | PRN

## 2018-05-14 MED ORDER — ROSUVASTATIN CALCIUM 5 MG PO TABS: 5.0000 mg | ORAL_TABLET | Freq: Every day | ORAL | Status: DC

## 2018-05-14 MED ORDER — ONDANSETRON HCL 4 MG/2ML IJ SOLN: 4.0000 mg | Freq: Four times a day (QID) | INTRAMUSCULAR | Status: DC | PRN

## 2018-05-14 MED ORDER — ACETAMINOPHEN 325 MG PO TABS
650.0000 mg | ORAL_TABLET | Freq: Once | ORAL | Status: AC
  Administered 2018-05-14: 650 mg via ORAL
  Filled 2018-05-14: qty 2

## 2018-05-14 MED ORDER — DILTIAZEM HCL ER COATED BEADS 180 MG PO CP24
180.0000 mg | ORAL_CAPSULE | Freq: Every morning | ORAL | Status: DC
  Administered 2018-05-15: 180 mg via ORAL
  Filled 2018-05-14: qty 1

## 2018-05-14 MED ORDER — APIXABAN 2.5 MG PO TABS
2.5000 mg | ORAL_TABLET | Freq: Two times a day (BID) | ORAL | Status: DC
  Administered 2018-05-15: 2.5 mg via ORAL
  Filled 2018-05-14: qty 1

## 2018-05-14 MED ORDER — POLYETHYLENE GLYCOL 3350 17 G PO PACK: 17.0000 g | PACK | Freq: Every day | ORAL | Status: DC | PRN

## 2018-05-14 MED ORDER — SODIUM CHLORIDE 0.9 % IV BOLUS
1000.0000 mL | Freq: Once | INTRAVENOUS | Status: AC
  Administered 2018-05-14: 1000 mL via INTRAVENOUS

## 2018-05-14 NOTE — ED Notes (Signed)
Patient transported to X-ray 

## 2018-05-14 NOTE — ED Triage Notes (Signed)
Pt reports she fell today and hit her face. Pt is on ELiquis. Has a pacemaker and at times is unaware that she is falling until already fallen.

## 2018-05-14 NOTE — ED Notes (Signed)
Pt needed to use restroom and was able to ambulate with some assistance to the restroom> pt is attempting to obtain a sample

## 2018-05-14 NOTE — ED Provider Notes (Signed)
[facility] DEPT Provider Note   CSN: [id number] Arrival date & time: 05/14/18  1431     History   Chief Complaint Chief Complaint  Patient presents with  . Fall  . Facial Injury    HPI Joy Newman is a 85 y.o. female.  HPI  Joy Newman is a 85 y.o. female, with a history of pacemaker, CAD, MI, HTN, mitral valve prolapse, tricuspid regurgitation, presenting to the ED with injuries from a fall that occurred around 1:45 pm today.  Accompanied by her longtime friend, Joy Newman, at the bedside. She thinks she passed out.  States she was walking along the sidewalk and then she woke up on the ground.  This has happened before and a pacemaker was placed in 2013 for this exact issue.  It seems to have helped decrease the frequency of these events, but the events still occur. States she hit her face, thinks she passed out only for a few seconds, woke up on the ground, but was immediately able to stand up by herself. She complains of mild pain to the face.  The fall knocked one of her right upper dental bridges out.  Denies recent illness, medication changes, chest pain, shortness of breath, headache, N/V/D, abdominal pain, confusion, neurologic deficits, neck/back pain, extremity pain, vision changes, or any other complaints.  Pacemaker: St. Jude.  She is on Eliquis.   Past Medical History:  Diagnosis Date  . Arteriovenous malformation of colon   . Basal cell carcinoma   . CAD (coronary artery disease) no obstruction at cath    Hx of with an anteroapical M.I  . Carotid sinus hypersensitivity   . Complete heart block-intermittent   . Diastolic dysfunction   . Diverticulosis   . Hyperlipidemia   . Hypertension   . Kidney stones   . Melanoma ([place])   . MVP (mitral valve prolapse)   . Myocardial infarction ([place])    2005  . Orthostasis   . Pacemaker    St Jude  . Pulmonary hypertension (HCC)     Hx of Mild  . PVC and PACs   .  Squamous carcinoma   . Syncope   . Tricuspid regurgitation    Moderate regurgitation  . Tubular adenoma of colon 02/2012    Patient Active Problem List   Diagnosis Date Noted  . PAF (paroxysmal atrial fibrillation) ([place]) 05/14/2018  . CAD (coronary artery disease) 05/14/2018  . Injury of face   . Subdural hematoma ([place]) 11/20/2014  . Syncope and collapse 11/20/2014  . Basal cell carcinoma 11/20/2014  . Complicated laceration of lip 11/20/2014  . Noise reversion on the ventricular lead 05/14/2012  . Pacemaker-St Judes   . Atrioventricular block, complete ([place]) 04/12/2011  . Carotid sinus hypersensitivity   . Hypertension   . PVC and PACs   . Syncope 04/11/2011  . Hyperlipidemia   . Diastolic dysfunction   . Mitral valve prolapse   . Tricuspid regurgitation   . History of Arteriovenous malformation of colon     Past Surgical History:  Procedure Laterality Date  . ABDOMINAL HYSTERECTOMY  1980   Hx of  . APPENDECTOMY  1954   Hx of  . CARDIAC CATHETERIZATION     Revealed an apical wall motion abnormality  . CARDIOVASCULAR STRESS TEST  02-21-2006   EF 84%, which revealed an apical defect  . NASAL RECONSTRUCTION  1981  . PACEMAKER INSERTION    . PERMANENT PACEMAKER INSERTION N/A 04/13/2011   Procedure: PERMANENT PACEMAKER  INSERTION;  Surgeon: Evans Lance, MD;  Location: [facility] CATH LAB;  Service: Cardiovascular;  Laterality: N/A;  . SKIN CANCER EXCISION     several, basal cell, squamous, melanoma  . TONSILLECTOMY  1971   Hx of     OB History   No obstetric history on file.      Home Medications    Prior to Admission medications   Medication Sig Start Date End Date Taking? Authorizing Provider  apixaban (ELIQUIS) 2.5 MG TABS tablet Take 1 tablet (2.5 mg total) by mouth 2 (two) times daily. Re-tart this medicine on 11/27/14 11/21/14  Yes Nita Sells, MD  BIOTIN PO Take 600 mg by mouth 2 (two) times daily.    Yes [provider]  Calcium  Carb-Cholecalciferol (CALCIUM 600 + D PO) Take 600 mg by mouth 2 (two) times daily.   Yes [provider]  Cholecalciferol (VITAMIN D) 2000 UNITS tablet Take 2,000 Units by mouth daily. VITAMIN D3 2000 MG   Yes [provider]  Cholecalciferol (VITAMIN D3 PO) Take 1 tablet by mouth daily.   Yes [provider]  diltiazem (CARDIZEM CD) 180 MG 24 hr capsule Take 1 capsule (180 mg total) by mouth every morning. 11/21/14  Yes Nita Sells, MD  Multiple Vitamin (MULTIVITAMIN WITH MINERALS) TABS tablet Take 1 tablet by mouth daily.   Yes [provider]  niacinamide 500 MG tablet Take 500 mg by mouth 2 (two) times daily with a meal.   Yes [provider]  OVER THE COUNTER MEDICATION Place 1 drop into both eyes daily. OTC eye drops for dry eyes   Yes [provider]  pantoprazole (PROTONIX) 40 MG tablet Take 40 mg by mouth daily.    Yes [provider]  rosuvastatin (CRESTOR) 5 MG tablet Take 5 mg by mouth daily.   Yes [provider]  Ascorbic Acid (VITAMIN C) 1000 MG tablet Take 1,000 mg by mouth daily.     [provider]    Family History Family History  Problem Relation Age of Onset  . Stroke Mother   . Kidney disease Father   . Lung cancer Brother   . Kidney disease Sister   . Stroke Brother   . Heart disease Brother        x 2  . Liver cancer Sister   . Breast cancer Neg Hx     Social History Social History   Tobacco Use  . Smoking status: Never Smoker  . Smokeless tobacco: Never Used  Substance Use Topics  . Alcohol use: Yes    Comment: Rarely  . Drug use: No     Allergies   Imdur [isosorbide mononitrate]; Isosorbide nitrate; Polysporin [bacitracin-polymyxin b]; Quinine derivatives; and Tetracyclines & related   Review of Systems Review of Systems  Constitutional: Negative for diaphoresis and fever.  Eyes: Negative for visual disturbance.  Respiratory: Negative for shortness of  breath.   Cardiovascular: Negative for chest pain.  Gastrointestinal: Negative for abdominal pain, blood in stool, diarrhea, nausea and vomiting.  Musculoskeletal: Negative for arthralgias, back pain and neck pain.  Neurological: Positive for syncope. Negative for dizziness, weakness, light-headedness, numbness and headaches.  All other systems reviewed and are negative.    Physical Exam Updated Vital Signs BP (!) 162/81 (BP Location: Left Arm)   Pulse 76   Temp 97.8 F (36.6 C) (Oral)   Resp 18   Ht 5\' 2"  (1.575 m)   Wt 53.2 kg   SpO2 98%  BMI 21.43 kg/m   Physical Exam Vitals signs and nursing note reviewed.  Constitutional:      General: She is not in acute distress.    Appearance: She is well-developed. She is not diaphoretic.  HENT:     Head: Normocephalic.     Comments: Superficial appearing abrasions abrasions noted to the right cheek, nose, upper lip, lower lip, and chin. No noted significant swelling to the nose, cheek, or upper lip.  There is some swelling to the right lower lip. She does have a gap in her right maxillary dentition, which patient states was the location of her bridge.  She has a superficial appearing non-gaping laceration to the right inner lower lip.  No trismus.  The rest of the dentition appears to be intact and stable.  Patient has full movement of the mandible without pain or noted difficulty.    Right Ear: Tympanic membrane, ear canal and external ear normal.     Left Ear: Tympanic membrane, ear canal and external ear normal.     Mouth/Throat:     Mouth: Mucous membranes are moist.     Pharynx: Oropharynx is clear.  Eyes:     Extraocular Movements: Extraocular movements intact.     Conjunctiva/sclera: Conjunctivae normal.     Pupils: Pupils are equal, round, and reactive to light.  Neck:     Musculoskeletal: Neck supple.  Cardiovascular:     Rate and Rhythm: Normal rate and regular rhythm.     Pulses: Normal pulses.     Heart sounds:  Normal heart sounds.  Pulmonary:     Effort: Pulmonary effort is normal. No respiratory distress.     Breath sounds: Normal breath sounds.  Abdominal:     Palpations: Abdomen is soft.     Tenderness: There is no abdominal tenderness. There is no guarding.  Musculoskeletal:     Right lower leg: No edema.     Left lower leg: No edema.     Comments: Normal motor function intact in all extremities. No midline spinal tenderness.   Overall trauma exam performed without any abnormalities noted other than those mentioned.  Lymphadenopathy:     Cervical: No cervical adenopathy.  Skin:    General: Skin is warm and dry.  Neurological:     Mental Status: She is alert and oriented to person, place, and time.     Comments: Sensation grossly intact to light touch in the extremities. Strength 5/5 in all extremities. No gait disturbance. Coordination intact. Cranial nerves III-XII grossly intact. No facial droop.   Psychiatric:        Mood and Affect: Mood and affect normal.        Speech: Speech normal.        Behavior: Behavior normal.          ED Treatments / Results  Labs (all labs ordered are listed, but only abnormal results are displayed) Labs Reviewed  COMPREHENSIVE METABOLIC PANEL - Abnormal; Notable for the following components:      Result Value   Glucose, Bld 105 (*)    Albumin 5.2 (*)    GFR calc non Af Amer 58 (*)    All other components within normal limits  CBC WITH DIFFERENTIAL/PLATELET - Abnormal; Notable for the following components:   WBC 13.0 (*)    HCT 46.8 (*)    MCV 101.7 (*)    Neutro Abs 9.8 (*)    All other components within normal limits  URINALYSIS, ROUTINE W REFLEX  MICROSCOPIC - Abnormal; Notable for the following components:   Color, Urine STRAW (*)    Leukocytes,Ua TRACE (*)    Bacteria, UA RARE (*)    All other components within normal limits  TROPONIN I  TROPONIN I  BASIC METABOLIC PANEL  CBC WITH DIFFERENTIAL/PLATELET    EKG EKG  Interpretation  Date/Time:  Tuesday May 14 2018 16:53:02 EST Ventricular Rate:  69 PR Interval:    QRS Duration: 89 QT Interval:  410 QTC Calculation: 440 R Axis:   58 Text Interpretation:  Sinus rhythm Atrial premature complex RSR' in V1 or V2, right VCD or RVH st depression in lead II and III resolved Otherwise no significant change Confirmed by Deno Etienne [phone]) on 05/14/2018 5:38:58 PM   Radiology Dg Chest 2 View  Result Date: 05/14/2018 CLINICAL DATA:  [place] today.  Hip pain. EXAM: CHEST - 2 VIEW COMPARISON:  11/20/2014 FINDINGS: Mild cardiac enlargement. Dual lead pacemaker with leads well positioned. Aortic atherosclerotic calcification. The pulmonary vascularity is normal. The lungs are clear. Degenerative changes affect the shoulders. Surgical clips in the right axilla. No acute bone finding. IMPRESSION: No active disease.  Mild cardiomegaly.  Pacemaker. Electronically Signed   By: Nelson Chimes M.D.   On: 05/14/2018 16:28   Ct Head Wo Contrast  Result Date: 05/14/2018 CLINICAL DATA:  Fall, anticoagulation EXAM: CT HEAD WITHOUT CONTRAST CT FACIAL BONES WITHOUT CONTRAST CT CERVICAL SPINE WITHOUT CONTRAST TECHNIQUE: Multidetector CT imaging of the head, facial bones and cervical spine was performed following the standard protocol without intravenous contrast. Multiplanar CT image reconstructions of the facial bones and cervical spine were also generated. COMPARISON:  11/21/2014 FINDINGS: CT HEAD FINDINGS Brain: No evidence of acute infarction, hemorrhage, hydrocephalus, extra-axial collection or mass lesion/mass effect. Periventricular white matter hypodensity and global volume loss. Vascular: No hyperdense vessel or unexpected calcification. CT FACIAL BONES FINDINGS Skull: Normal. Negative for fracture or focal lesion. Facial bones: No displaced fractures or dislocations. Sinuses/Orbits: No acute finding. Other: None. CT CERVICAL SPINE FINDINGS Alignment: Degenerative straightening of  the normal cervical lordosis. Skull base and vertebrae: No acute fracture. No primary bone lesion or focal pathologic process. Soft tissues and spinal canal: No prevertebral fluid or swelling. No visible canal hematoma. Disc levels: Moderate multilevel disc and facet degenerative disease. Upper chest: Negative. Other: None. IMPRESSION: 1. No acute intracranial pathology. Small-vessel white matter disease and global volume loss in keeping with patient age. 2.  No displaced fracture of the facial bones. 3. No fracture or static subluxation of the cervical spine. Moderate multilevel disc and facet degenerative disease. Electronically Signed   By: Eddie Candle M.D.   On: 05/14/2018 16:03   Ct Cervical Spine Wo Contrast  Result Date: 05/14/2018 CLINICAL DATA:  Fall, anticoagulation EXAM: CT HEAD WITHOUT CONTRAST CT FACIAL BONES WITHOUT CONTRAST CT CERVICAL SPINE WITHOUT CONTRAST TECHNIQUE: Multidetector CT imaging of the head, facial bones and cervical spine was performed following the standard protocol without intravenous contrast. Multiplanar CT image reconstructions of the facial bones and cervical spine were also generated. COMPARISON:  11/21/2014 FINDINGS: CT HEAD FINDINGS Brain: No evidence of acute infarction, hemorrhage, hydrocephalus, extra-axial collection or mass lesion/mass effect. Periventricular white matter hypodensity and global volume loss. Vascular: No hyperdense vessel or unexpected calcification. CT FACIAL BONES FINDINGS Skull: Normal. Negative for fracture or focal lesion. Facial bones: No displaced fractures or dislocations. Sinuses/Orbits: No acute finding. Other: None. CT CERVICAL SPINE FINDINGS Alignment: Degenerative straightening of the normal cervical lordosis. Skull base and vertebrae:  No acute fracture. No primary bone lesion or focal pathologic process. Soft tissues and spinal canal: No prevertebral fluid or swelling. No visible canal hematoma. Disc levels: Moderate multilevel disc and  facet degenerative disease. Upper chest: Negative. Other: None. IMPRESSION: 1. No acute intracranial pathology. Small-vessel white matter disease and global volume loss in keeping with patient age. 2.  No displaced fracture of the facial bones. 3. No fracture or static subluxation of the cervical spine. Moderate multilevel disc and facet degenerative disease. Electronically Signed   By: Eddie Candle M.D.   On: 05/14/2018 16:03   Dg Hips Bilat W Or Wo Pelvis 2 Views  Result Date: 05/14/2018 CLINICAL DATA:  [place] today with bilateral hip pain. EXAM: DG HIP (WITH OR WITHOUT PELVIS) 2V BILAT COMPARISON:  None. FINDINGS: There is no evidence of hip fracture or dislocation. There is no evidence of arthropathy or other focal bone abnormality. IMPRESSION: Negative. Electronically Signed   By: Nelson Chimes M.D.   On: 05/14/2018 16:29   Ct Maxillofacial Wo Contrast  Result Date: 05/14/2018 CLINICAL DATA:  Fall, anticoagulation EXAM: CT HEAD WITHOUT CONTRAST CT FACIAL BONES WITHOUT CONTRAST CT CERVICAL SPINE WITHOUT CONTRAST TECHNIQUE: Multidetector CT imaging of the head, facial bones and cervical spine was performed following the standard protocol without intravenous contrast. Multiplanar CT image reconstructions of the facial bones and cervical spine were also generated. COMPARISON:  11/21/2014 FINDINGS: CT HEAD FINDINGS Brain: No evidence of acute infarction, hemorrhage, hydrocephalus, extra-axial collection or mass lesion/mass effect. Periventricular white matter hypodensity and global volume loss. Vascular: No hyperdense vessel or unexpected calcification. CT FACIAL BONES FINDINGS Skull: Normal. Negative for fracture or focal lesion. Facial bones: No displaced fractures or dislocations. Sinuses/Orbits: No acute finding. Other: None. CT CERVICAL SPINE FINDINGS Alignment: Degenerative straightening of the normal cervical lordosis. Skull base and vertebrae: No acute fracture. No primary bone lesion or focal  pathologic process. Soft tissues and spinal canal: No prevertebral fluid or swelling. No visible canal hematoma. Disc levels: Moderate multilevel disc and facet degenerative disease. Upper chest: Negative. Other: None. IMPRESSION: 1. No acute intracranial pathology. Small-vessel white matter disease and global volume loss in keeping with patient age. 2.  No displaced fracture of the facial bones. 3. No fracture or static subluxation of the cervical spine. Moderate multilevel disc and facet degenerative disease. Electronically Signed   By: Eddie Candle M.D.   On: 05/14/2018 16:03    Procedures Procedures (including critical care time)  Medications Ordered in ED Medications  diltiazem (CARDIZEM CD) 24 hr capsule 180 mg (has no administration in time range)  rosuvastatin (CRESTOR) tablet 5 mg (has no administration in time range)  pantoprazole (PROTONIX) EC tablet 40 mg (has no administration in time range)  apixaban (ELIQUIS) tablet 2.5 mg (has no administration in time range)  sodium chloride flush (NS) 0.9 % injection 3 mL (3 mLs Intravenous Given 05/14/18 2226)  0.9 %  sodium chloride infusion ( Intravenous New Bag/Given 05/14/18 2225)  acetaminophen (TYLENOL) tablet 650 mg (has no administration in time range)    Or  acetaminophen (TYLENOL) suppository 650 mg (has no administration in time range)  HYDROcodone-acetaminophen (NORCO/VICODIN) 5-325 MG per tablet 1 tablet (has no administration in time range)  polyethylene glycol (MIRALAX / GLYCOLAX) packet 17 g (has no administration in time range)  ondansetron (ZOFRAN) tablet 4 mg (has no administration in time range)    Or  ondansetron (ZOFRAN) injection 4 mg (has no administration in time range)  bacitracin 500 UNIT/GM ointment (  Given 05/14/18 1946)  sodium chloride 0.9 % bolus 1,000 mL (0 mLs Intravenous Stopped 05/14/18 2111)  ondansetron (ZOFRAN) injection 4 mg (4 mg Intravenous Given 05/14/18 1929)  acetaminophen (TYLENOL) tablet 650 mg  (650 mg Oral Given 05/14/18 2014)     Initial Impression / Assessment and Plan / ED Course  I have reviewed the triage vital signs and the nursing notes.  Pertinent labs & imaging results that were available during my care of the patient were reviewed by me and considered in my medical decision making (see chart for details).  Clinical Course as of May 15 2339  Tue May 14, 2018  1920 RN states patient started to become nauseous.  I went in to reassess the patient.  She is not vomiting, but does complain of some nausea.  She also complains of a 5/10 frontal headache. She states she thinks she is just anxious because she did not expect to have to be admitted.  She is also worried that there may be something wrong with her heart and this is what made her pass out. She denies chest pain, dizziness, shortness of breath, abdominal pain, or any other complaints.  Reexamination shows no neuro deficits.   [SJ]  2005 Spoke with Dr. Paticia Stack, cardiology fellow.  States patient could benefit from observation. Will need repeat echo. Follow up with cardiology in the office.    [SJ]  2040 Spoke with Dr. Myna Hidalgo, hospitalist. Agrees to admit the patient.   [SJ]    Clinical Course User Index [SJ] Joy, Shawn C, PA-C    Patient presents after syncopal episode and head injury.  Syncopal episode had no prodrome.  Lab work overall reassuring.  CT imaging without acute abnormalities.  There were no noted wounds that would benefit from laceration repair.  Delta troponins negative.  Pacemaker interrogated without abnormalities noted today. Patient admitted for observation and possibly repeat echo.  Findings and plan of care discussed with Daleen Bo, MD and then with Dr. Tyrone Nine following EDP shift change. Dr. Eulis Foster personally evaluated and examined this patient.  Final Clinical Impressions(s) / ED Diagnoses   Final diagnoses:  Syncope and collapse  Injury of head, initial encounter  Facial injury, initial  encounter    ED Discharge Orders    None       Layla Maw 05/14/18 2344    Daleen Bo, MD 05/15/18 [phone]

## 2018-05-14 NOTE — ED Provider Notes (Signed)
  Face-to-face evaluation   History: Patient fell, outside without warning.  She has had other episodes similar to this in the past.  She has a cardiac pacemaker.  She injured only her face in the fall today.  Denies recent illnesses.  Physical exam: Elderly female who is calm and comfortable.  No respiratory distress.  No dysarthria or aphasia.  Normal range of motion neck.  Good rnge of motion arms and legs.  Medical screening examination/treatment/procedure(s) were conducted as a shared visit with non-physician practitioner(s) and myself.  I personally evaluated the patient during the encounter   Daleen Bo, MD 05/15/18 [phone]

## 2018-05-14 NOTE — ED Notes (Signed)
ED TO INPATIENT HANDOFF REPORT  Name/Age/Gender Joy Newman 85 y.o. female  Code Status    Code Status Orders  (From admission, onward)         Start     Ordered   05/14/18 2041  Full code  Continuous     05/14/18 2041        Code Status History    Date Active Date Inactive Code Status Order ID Comments User Context   11/20/2014 1448 11/21/2014 1910 Full Code [id number]  Nita Sells, MD Inpatient   04/11/2011 2222 04/13/2011 1911 Full Code [id number]  Jacolyn Reedy, MD ED    Advance Directive Documentation     Most Recent Value  Type of Advance Directive  Out of facility DNR (pink MOST or yellow form)  Pre-existing out of facility DNR order (yellow form or pink MOST form)  Yellow form placed in chart (order not valid for inpatient use)  "MOST" Form in Place?  -      Home/SNF/Other Home  Chief Complaint fall/facial abrasions;teeth loose  Level of Care/Admitting Diagnosis ED Disposition    ED Disposition Condition [place]: [address] [100102]  Level of Care: Telemetry [5]  Admit to tele based on following criteria: Eval of Syncope  Diagnosis: Syncope [206001]  Admitting Physician: Vianne Bulls [[id number]]  Attending Physician: Vianne Bulls [[id number]]  PT Class (Do Not Modify): Observation [104]  PT Acc Code (Do Not Modify): Observation [10022]       Medical History Past Medical History:  Diagnosis Date  . Arteriovenous malformation of colon   . Basal cell carcinoma   . CAD (coronary artery disease) no obstruction at cath    Hx of with an anteroapical M.I  . Carotid sinus hypersensitivity   . Complete heart block-intermittent   . Diastolic dysfunction   . Diverticulosis   . Hyperlipidemia   . Hypertension   . Kidney stones   . Melanoma ([place])   . MVP (mitral valve prolapse)   . Myocardial infarction ([place])    2005  . Orthostasis   . Pacemaker    St Jude  . Pulmonary hypertension ([facility])      Hx of Mild  . PVC and PACs   . Squamous carcinoma   . Syncope   . Tricuspid regurgitation    Moderate regurgitation  . Tubular adenoma of colon 02/2012    Allergies Allergies  Allergen Reactions  . Imdur [Isosorbide Mononitrate]     Intolerant and cause headaches and nausea  . Isosorbide Nitrate Other (See Comments) and Nausea Only  . Polysporin [Bacitracin-Polymyxin B]     Rash  . Quinine Derivatives     Increase in Heart Rate and Decrease in BP   . Tetracyclines & Related     Rash    IV Location/Drains/Wounds Patient Lines/Drains/Airways Status   Active Line/Drains/Airways    Name:   Placement date:   Placement time:   Site:   Days:   Peripheral IV 05/14/18 Left Antecubital   05/14/18    1928    Antecubital   less than 1   Incision 04/13/11 Chest Left   04/13/11    2015     2588   Wound 07/24/12 Abrasion(s) Face   07/24/12    2040    Face   2120   Wound 07/24/12 Abrasion(s) Knee Left   07/24/12    2040    Knee   2120   Wound /  Incision (Open or Dehisced) 11/20/14 Laceration Lip Right;Lower   11/20/14    1419    Lip   1271          Labs/Imaging Results for orders placed or performed during the hospital encounter of 05/14/18 (from the past 48 hour(s))  Comprehensive metabolic panel     Status: Abnormal   Collection Time: 05/14/18  4:12 PM  Result Value Ref Range   Sodium 141 135 - 145 mmol/L   Potassium 4.1 3.5 - 5.1 mmol/L   Chloride 100 98 - 111 mmol/L   CO2 30 22 - 32 mmol/L   Glucose, Bld 105 (H) 70 - 99 mg/dL   BUN 19 8 - 23 mg/dL   Creatinine, Ser 0.89 0.44 - 1.00 mg/dL   Calcium 9.5 8.9 - 10.3 mg/dL   Total Protein 7.9 6.5 - 8.1 g/dL   Albumin 5.2 (H) 3.5 - 5.0 g/dL   AST 33 15 - 41 U/L   ALT 17 0 - 44 U/L   Alkaline Phosphatase 66 38 - 126 U/L   Total Bilirubin 0.5 0.3 - 1.2 mg/dL   GFR calc non Af Amer 58 (L) >60 mL/min   GFR calc Af Amer >60 >60 mL/min   Anion gap 11 5 - 15    Comment: Performed at [facility], [place]  [address], [place], [place] [address]  CBC with Differential     Status: Abnormal   Collection Time: 05/14/18  4:12 PM  Result Value Ref Range   WBC 13.0 (H) 4.0 - 10.5 K/uL   RBC 4.60 3.87 - 5.11 MIL/uL   Hemoglobin 14.8 12.0 - 15.0 g/dL   HCT 46.8 (H) 36.0 - 46.0 %   MCV 101.7 (H) 80.0 - 100.0 fL   MCH 32.2 26.0 - 34.0 pg   MCHC 31.6 30.0 - 36.0 g/dL   RDW 12.8 11.5 - 15.5 %   Platelets 267 150 - 400 K/uL   nRBC 0.0 0.0 - 0.2 %   Neutrophils Relative % 74 %   Neutro Abs 9.8 (H) 1.7 - 7.7 K/uL   Lymphocytes Relative 16 %   Lymphs Abs 2.0 0.7 - 4.0 K/uL   Monocytes Relative 8 %   Monocytes Absolute 1.0 0.1 - 1.0 K/uL   Eosinophils Relative 1 %   Eosinophils Absolute 0.1 0.0 - 0.5 K/uL   Basophils Relative 1 %   Basophils Absolute 0.1 0.0 - 0.1 K/uL   Immature Granulocytes 0 %   Abs Immature Granulocytes 0.04 0.00 - 0.07 K/uL    Comment: Performed at [facility], [place] [address], [place], [place] [address]  Urinalysis, Routine w reflex microscopic     Status: Abnormal   Collection Time: 05/14/18  4:12 PM  Result Value Ref Range   Color, Urine STRAW (A) YELLOW   APPearance CLEAR CLEAR   Specific Gravity, Urine 1.005 1.005 - 1.030   pH 7.0 5.0 - 8.0   Glucose, UA NEGATIVE NEGATIVE mg/dL   Hgb urine dipstick NEGATIVE NEGATIVE   Bilirubin Urine NEGATIVE NEGATIVE   Ketones, ur NEGATIVE NEGATIVE mg/dL   Protein, ur NEGATIVE NEGATIVE mg/dL   Nitrite NEGATIVE NEGATIVE   Leukocytes,Ua TRACE (A) NEGATIVE   RBC / HPF 0-5 0 - 5 RBC/hpf   WBC, UA 0-5 0 - 5 WBC/hpf   Bacteria, UA RARE (A) NONE SEEN    Comment: Performed at [facility], [place] [address], [place], [place] [address]  Troponin I - Once  Status: None   Collection Time: 05/14/18  4:12 PM  Result Value Ref Range   Troponin I <0.03 <0.03 ng/mL    Comment: Performed at [facility], [place] [address], [place], [place] [address]  Troponin I - Once     Status: None    Collection Time: 05/14/18  7:23 PM  Result Value Ref Range   Troponin I <0.03 <0.03 ng/mL    Comment: Performed at [facility], [place] [address], [place], [place] [address]   Dg Chest 2 View  Result Date: 05/14/2018 CLINICAL DATA:  [place] today.  Hip pain. EXAM: CHEST - 2 VIEW COMPARISON:  11/20/2014 FINDINGS: Mild cardiac enlargement. Dual lead pacemaker with leads well positioned. Aortic atherosclerotic calcification. The pulmonary vascularity is normal. The lungs are clear. Degenerative changes affect the shoulders. Surgical clips in the right axilla. No acute bone finding. IMPRESSION: No active disease.  Mild cardiomegaly.  Pacemaker. Electronically Signed   By: Nelson Chimes M.D.   On: 05/14/2018 16:28   Ct Head Wo Contrast  Result Date: 05/14/2018 CLINICAL DATA:  Fall, anticoagulation EXAM: CT HEAD WITHOUT CONTRAST CT FACIAL BONES WITHOUT CONTRAST CT CERVICAL SPINE WITHOUT CONTRAST TECHNIQUE: Multidetector CT imaging of the head, facial bones and cervical spine was performed following the standard protocol without intravenous contrast. Multiplanar CT image reconstructions of the facial bones and cervical spine were also generated. COMPARISON:  11/21/2014 FINDINGS: CT HEAD FINDINGS Brain: No evidence of acute infarction, hemorrhage, hydrocephalus, extra-axial collection or mass lesion/mass effect. Periventricular white matter hypodensity and global volume loss. Vascular: No hyperdense vessel or unexpected calcification. CT FACIAL BONES FINDINGS Skull: Normal. Negative for fracture or focal lesion. Facial bones: No displaced fractures or dislocations. Sinuses/Orbits: No acute finding. Other: None. CT CERVICAL SPINE FINDINGS Alignment: Degenerative straightening of the normal cervical lordosis. Skull base and vertebrae: No acute fracture. No primary bone lesion or focal pathologic process. Soft tissues and spinal canal: No prevertebral fluid or swelling. No visible canal hematoma. Disc  levels: Moderate multilevel disc and facet degenerative disease. Upper chest: Negative. Other: None. IMPRESSION: 1. No acute intracranial pathology. Small-vessel white matter disease and global volume loss in keeping with patient age. 2.  No displaced fracture of the facial bones. 3. No fracture or static subluxation of the cervical spine. Moderate multilevel disc and facet degenerative disease. Electronically Signed   By: Eddie Candle M.D.   On: 05/14/2018 16:03   Ct Cervical Spine Wo Contrast  Result Date: 05/14/2018 CLINICAL DATA:  Fall, anticoagulation EXAM: CT HEAD WITHOUT CONTRAST CT FACIAL BONES WITHOUT CONTRAST CT CERVICAL SPINE WITHOUT CONTRAST TECHNIQUE: Multidetector CT imaging of the head, facial bones and cervical spine was performed following the standard protocol without intravenous contrast. Multiplanar CT image reconstructions of the facial bones and cervical spine were also generated. COMPARISON:  11/21/2014 FINDINGS: CT HEAD FINDINGS Brain: No evidence of acute infarction, hemorrhage, hydrocephalus, extra-axial collection or mass lesion/mass effect. Periventricular white matter hypodensity and global volume loss. Vascular: No hyperdense vessel or unexpected calcification. CT FACIAL BONES FINDINGS Skull: Normal. Negative for fracture or focal lesion. Facial bones: No displaced fractures or dislocations. Sinuses/Orbits: No acute finding. Other: None. CT CERVICAL SPINE FINDINGS Alignment: Degenerative straightening of the normal cervical lordosis. Skull base and vertebrae: No acute fracture. No primary bone lesion or focal pathologic process. Soft tissues and spinal canal: No prevertebral fluid or swelling. No visible canal hematoma. Disc levels: Moderate multilevel disc and facet degenerative disease. Upper chest: Negative. Other: None. IMPRESSION: 1.  No acute intracranial pathology. Small-vessel white matter disease and global volume loss in keeping with patient age. 2.  No displaced fracture  of the facial bones. 3. No fracture or static subluxation of the cervical spine. Moderate multilevel disc and facet degenerative disease. Electronically Signed   By: Eddie Candle M.D.   On: 05/14/2018 16:03   Dg Hips Bilat W Or Wo Pelvis 2 Views  Result Date: 05/14/2018 CLINICAL DATA:  [place] today with bilateral hip pain. EXAM: DG HIP (WITH OR WITHOUT PELVIS) 2V BILAT COMPARISON:  None. FINDINGS: There is no evidence of hip fracture or dislocation. There is no evidence of arthropathy or other focal bone abnormality. IMPRESSION: Negative. Electronically Signed   By: Nelson Chimes M.D.   On: 05/14/2018 16:29   Ct Maxillofacial Wo Contrast  Result Date: 05/14/2018 CLINICAL DATA:  Fall, anticoagulation EXAM: CT HEAD WITHOUT CONTRAST CT FACIAL BONES WITHOUT CONTRAST CT CERVICAL SPINE WITHOUT CONTRAST TECHNIQUE: Multidetector CT imaging of the head, facial bones and cervical spine was performed following the standard protocol without intravenous contrast. Multiplanar CT image reconstructions of the facial bones and cervical spine were also generated. COMPARISON:  11/21/2014 FINDINGS: CT HEAD FINDINGS Brain: No evidence of acute infarction, hemorrhage, hydrocephalus, extra-axial collection or mass lesion/mass effect. Periventricular white matter hypodensity and global volume loss. Vascular: No hyperdense vessel or unexpected calcification. CT FACIAL BONES FINDINGS Skull: Normal. Negative for fracture or focal lesion. Facial bones: No displaced fractures or dislocations. Sinuses/Orbits: No acute finding. Other: None. CT CERVICAL SPINE FINDINGS Alignment: Degenerative straightening of the normal cervical lordosis. Skull base and vertebrae: No acute fracture. No primary bone lesion or focal pathologic process. Soft tissues and spinal canal: No prevertebral fluid or swelling. No visible canal hematoma. Disc levels: Moderate multilevel disc and facet degenerative disease. Upper chest: Negative. Other: None. IMPRESSION: 1.  No acute intracranial pathology. Small-vessel white matter disease and global volume loss in keeping with patient age. 2.  No displaced fracture of the facial bones. 3. No fracture or static subluxation of the cervical spine. Moderate multilevel disc and facet degenerative disease. Electronically Signed   By: Eddie Candle M.D.   On: 05/14/2018 16:03    Pending Labs Unresulted Labs (From admission, onward)    Start     Ordered   05/15/18 0102  Basic metabolic panel  Tomorrow morning,   R     05/14/18 2041   05/15/18 0500  CBC WITH DIFFERENTIAL  Tomorrow morning,   R     05/14/18 2041          Vitals/Pain Today's Vitals   05/14/18 1833 05/14/18 1900 05/14/18 2000 05/14/18 2100  BP: (!) 177/69 (!) 181/71 (!) 168/62 (!) 148/72  Pulse: 76 78 72 82  Resp: 16 14 16 16   Temp:      TempSrc:      SpO2: 98% 97% 96% 95%  Weight:      Height:      PainSc:        Isolation Precautions No active isolations  Medications Medications  diltiazem (CARDIZEM CD) 24 hr capsule 180 mg (has no administration in time range)  rosuvastatin (CRESTOR) tablet 5 mg (has no administration in time range)  pantoprazole (PROTONIX) EC tablet 40 mg (has no administration in time range)  apixaban (ELIQUIS) tablet 2.5 mg (has no administration in time range)  sodium chloride flush (NS) 0.9 % injection 3 mL (has no administration in time range)  0.9 %  sodium chloride infusion (has no administration  in time range)  acetaminophen (TYLENOL) tablet 650 mg (has no administration in time range)    Or  acetaminophen (TYLENOL) suppository 650 mg (has no administration in time range)  HYDROcodone-acetaminophen (NORCO/VICODIN) 5-325 MG per tablet 1 tablet (has no administration in time range)  polyethylene glycol (MIRALAX / GLYCOLAX) packet 17 g (has no administration in time range)  ondansetron (ZOFRAN) tablet 4 mg (has no administration in time range)    Or  ondansetron (ZOFRAN) injection 4 mg (has no administration  in time range)  bacitracin 500 UNIT/GM ointment (  Given 05/14/18 1946)  sodium chloride 0.9 % bolus 1,000 mL (0 mLs Intravenous Stopped 05/14/18 2111)  ondansetron (ZOFRAN) injection 4 mg (4 mg Intravenous Given 05/14/18 1929)  acetaminophen (TYLENOL) tablet 650 mg (650 mg Oral Given 05/14/18 2014)    Mobility walks

## 2018-05-14 NOTE — ED Notes (Addendum)
Patient transported to CT 

## 2018-05-14 NOTE — H&P (Signed)
History and Physical    Melika Reder EXB:[id number] DOB: Aug 03, 1927 DOA: 05/14/2018  PCP: Haywood Pao, MD   Patient coming from: Home   Chief Complaint: Syncope with facial injuries   HPI: Joy Newman is a 85 y.o. female with medical history significant for heart block status post pacer placement, CAD, paroxysmal atrial fibrillation on Eliquis, and history of recurrent episodes with transient loss of consciousness, now presenting to emergency department after a fall with possible syncope.  Patient reports a long history of episodes where she falls without any prodrome, wakes up on the ground, and has undergone work-up notable for complete heart block and carotid sinus hypersensitivity.  She had a pacer placed in 2013 and last episode was approximately 2 years ago.  She seemed to be in her usual state this morning, was standing and conversing with a friend, and then woke on the ground.  She denies any prodrome and seemed to recover quickly.  There was no shaking or incontinence.  She denies chest pain or palpitations, denies lightheadedness on standing.  ED Course: Upon arrival to the ED, patient is found to be afebrile, saturating well on room air, and with systolic blood pressure of 180.  EKG features a sinus rhythm with PAC.  Chest x-ray is negative for acute cardiopulmonary disease.  CT head, maxillofacial, and cervical spine are negative for acute pathology.  Chemistry panel is notable for an elevated albumin and CBC features a mild macrocytosis without anemia.  Troponin is undetectable x2.  Urinalysis unremarkable.  Patient was given a liter of normal saline and cardiology was consulted by the ED physician.  Pacemaker was interrogated in the ED and there were no events reported.  Hospitalists are asked to admit for further evaluation and management of recurrent syncope.  Review of Systems:  All other systems reviewed and apart from HPI, are negative.  Past Medical  History:  Diagnosis Date  . Arteriovenous malformation of colon   . Basal cell carcinoma   . CAD (coronary artery disease) no obstruction at cath    Hx of with an anteroapical M.I  . Carotid sinus hypersensitivity   . Complete heart block-intermittent   . Diastolic dysfunction   . Diverticulosis   . Hyperlipidemia   . Hypertension   . Kidney stones   . Melanoma ([place])   . MVP (mitral valve prolapse)   . Myocardial infarction ([place])    2005  . Orthostasis   . Pacemaker    St Jude  . Pulmonary hypertension (HCC)     Hx of Mild  . PVC and PACs   . Squamous carcinoma   . Syncope   . Tricuspid regurgitation    Moderate regurgitation  . Tubular adenoma of colon 02/2012    Past Surgical History:  Procedure Laterality Date  . ABDOMINAL HYSTERECTOMY  1980   Hx of  . APPENDECTOMY  1954   Hx of  . CARDIAC CATHETERIZATION     Revealed an apical wall motion abnormality  . CARDIOVASCULAR STRESS TEST  02-21-2006   EF 84%, which revealed an apical defect  . NASAL RECONSTRUCTION  1981  . PACEMAKER INSERTION    . PERMANENT PACEMAKER INSERTION N/A 04/13/2011   Procedure: PERMANENT PACEMAKER INSERTION;  Surgeon: Evans Lance, MD;  Location: [facility] CATH LAB;  Service: Cardiovascular;  Laterality: N/A;  . SKIN CANCER EXCISION     several, basal cell, squamous, melanoma  . TONSILLECTOMY  1971   Hx of     reports that  she has never smoked. She has never used smokeless tobacco. She reports current alcohol use. She reports that she does not use drugs.  Allergies  Allergen Reactions  . Imdur [Isosorbide Mononitrate]     Intolerant and cause headaches and nausea  . Isosorbide Nitrate Other (See Comments) and Nausea Only  . Polysporin [Bacitracin-Polymyxin B]     Rash  . Quinine Derivatives     Increase in Heart Rate and Decrease in BP   . Tetracyclines & Related     Rash    Family History  Problem Relation Age of Onset  . Stroke Mother   . Kidney disease Father   . Lung cancer  Brother   . Kidney disease Sister   . Stroke Brother   . Heart disease Brother        x 2  . Liver cancer Sister   . Breast cancer Neg Hx      Prior to Admission medications   Medication Sig Start Date End Date Taking? Authorizing Provider  apixaban (ELIQUIS) 2.5 MG TABS tablet Take 1 tablet (2.5 mg total) by mouth 2 (two) times daily. Re-tart this medicine on 11/27/14 11/21/14  Yes Nita Sells, MD  BIOTIN PO Take 600 mg by mouth 2 (two) times daily.    Yes [provider]  Calcium Carb-Cholecalciferol (CALCIUM 600 + D PO) Take 600 mg by mouth 2 (two) times daily.   Yes [provider]  Cholecalciferol (VITAMIN D) 2000 UNITS tablet Take 2,000 Units by mouth daily. VITAMIN D3 2000 MG   Yes [provider]  Cholecalciferol (VITAMIN D3 PO) Take 1 tablet by mouth daily.   Yes [provider]  diltiazem (CARDIZEM CD) 180 MG 24 hr capsule Take 1 capsule (180 mg total) by mouth every morning. 11/21/14  Yes Nita Sells, MD  Multiple Vitamin (MULTIVITAMIN WITH MINERALS) TABS tablet Take 1 tablet by mouth daily.   Yes [provider]  niacinamide 500 MG tablet Take 500 mg by mouth 2 (two) times daily with a meal.   Yes [provider]  pantoprazole (PROTONIX) 40 MG tablet Take 40 mg by mouth daily.    Yes [provider]  rosuvastatin (CRESTOR) 5 MG tablet Take 5 mg by mouth daily.   Yes [provider]  Ascorbic Acid (VITAMIN C) 1000 MG tablet Take 1,000 mg by mouth daily.     [provider]  OVER THE COUNTER MEDICATION Place 1 drop into both eyes daily. OTC eye drops for dry eyes    [provider]    Physical Exam: Vitals:   05/14/18 1730 05/14/18 1833 05/14/18 1900 05/14/18 2000  BP: (!) 167/66 (!) 177/69 (!) 181/71 (!) 168/62  Pulse: 62 76 78 72  Resp: 12 16 14 16   Temp:      TempSrc:      SpO2: 98% 98% 97% 96%  Weight:      Height:        Constitutional: NAD, calm  Eyes:  PERTLA, lids and conjunctivae normal ENMT: Mucous membranes are moist. Facial abrasions and ecchymosis.   Neck: normal, supple, no masses, no thyromegaly Respiratory: clear to auscultation bilaterally, no wheezing, no crackles. Normal respiratory effort.   Cardiovascular: S1 & S2 heard, regular rate and rhythm. No extremity edema.   Abdomen: No distension, no tenderness, soft. Bowel sounds active.  Musculoskeletal: no clubbing / cyanosis. No joint deformity upper and lower extremities.   Skin: Facial abrasions. Warm, dry, well-perfused. Neurologic: No facial asymmetry.  Sensation intact. Moving all extremities.  Psychiatric:  Alert and oriented x 3. Calm, cooperative.    Labs on Admission: I have personally reviewed following labs and imaging studies  CBC: Recent Labs  Lab 05/14/18 1612  WBC 13.0*  NEUTROABS 9.8*  HGB 14.8  HCT 46.8*  MCV 101.7*  PLT 254   Basic Metabolic Panel: Recent Labs  Lab 05/14/18 1612  NA 141  K 4.1  CL 100  CO2 30  GLUCOSE 105*  BUN 19  CREATININE 0.89  CALCIUM 9.5   GFR: Estimated Creatinine Clearance: 34.6 mL/min (by C-G formula based on SCr of 0.89 mg/dL). Liver Function Tests: Recent Labs  Lab 05/14/18 1612  AST 33  ALT 17  ALKPHOS 66  BILITOT 0.5  PROT 7.9  ALBUMIN 5.2*   No results for input(s): LIPASE, AMYLASE in the last 168 hours. No results for input(s): AMMONIA in the last 168 hours. Coagulation Profile: No results for input(s): INR, PROTIME in the last 168 hours. Cardiac Enzymes: Recent Labs  Lab 05/14/18 1612 05/14/18 1923  TROPONINI <0.03 <0.03   BNP (last 3 results) No results for input(s): PROBNP in the last 8760 hours. HbA1C: No results for input(s): HGBA1C in the last 72 hours. CBG: No results for input(s): GLUCAP in the last 168 hours. Lipid Profile: No results for input(s): CHOL, HDL, LDLCALC, TRIG, CHOLHDL, LDLDIRECT in the last 72 hours. Thyroid Function Tests: No results for input(s): TSH, T4TOTAL,  FREET4, T3FREE, THYROIDAB in the last 72 hours. Anemia Panel: No results for input(s): VITAMINB12, FOLATE, FERRITIN, TIBC, IRON, RETICCTPCT in the last 72 hours. Urine analysis:    Component Value Date/Time   COLORURINE STRAW (A) 05/14/2018 1612   APPEARANCEUR CLEAR 05/14/2018 1612   LABSPEC 1.005 05/14/2018 1612   PHURINE 7.0 05/14/2018 1612   GLUCOSEU NEGATIVE 05/14/2018 1612   HGBUR NEGATIVE 05/14/2018 1612   BILIRUBINUR NEGATIVE 05/14/2018 1612   KETONESUR NEGATIVE 05/14/2018 1612   PROTEINUR NEGATIVE 05/14/2018 1612   UROBILINOGEN 0.2 01/30/2013 2034   NITRITE NEGATIVE 05/14/2018 1612   LEUKOCYTESUR TRACE (A) 05/14/2018 1612   Sepsis Labs: @LABRCNTIP (procalcitonin:4,lacticidven:4) )No results found for this or any previous visit (from the past 240 hour(s)).   Radiological Exams on Admission: Dg Chest 2 View  Result Date: 05/14/2018 CLINICAL DATA:  [place] today.  Hip pain. EXAM: CHEST - 2 VIEW COMPARISON:  11/20/2014 FINDINGS: Mild cardiac enlargement. Dual lead pacemaker with leads well positioned. Aortic atherosclerotic calcification. The pulmonary vascularity is normal. The lungs are clear. Degenerative changes affect the shoulders. Surgical clips in the right axilla. No acute bone finding. IMPRESSION: No active disease.  Mild cardiomegaly.  Pacemaker. Electronically Signed   By: Nelson Chimes M.D.   On: 05/14/2018 16:28   Ct Head Wo Contrast  Result Date: 05/14/2018 CLINICAL DATA:  Fall, anticoagulation EXAM: CT HEAD WITHOUT CONTRAST CT FACIAL BONES WITHOUT CONTRAST CT CERVICAL SPINE WITHOUT CONTRAST TECHNIQUE: Multidetector CT imaging of the head, facial bones and cervical spine was performed following the standard protocol without intravenous contrast. Multiplanar CT image reconstructions of the facial bones and cervical spine were also generated. COMPARISON:  11/21/2014 FINDINGS: CT HEAD FINDINGS Brain: No evidence of acute infarction, hemorrhage, hydrocephalus, extra-axial  collection or mass lesion/mass effect. Periventricular white matter hypodensity and global volume loss. Vascular: No hyperdense vessel or unexpected calcification. CT FACIAL BONES FINDINGS Skull: Normal. Negative for fracture or focal lesion. Facial bones: No displaced fractures or dislocations. Sinuses/Orbits: No acute finding. Other: None. CT CERVICAL SPINE FINDINGS Alignment: Degenerative  straightening of the normal cervical lordosis. Skull base and vertebrae: No acute fracture. No primary bone lesion or focal pathologic process. Soft tissues and spinal canal: No prevertebral fluid or swelling. No visible canal hematoma. Disc levels: Moderate multilevel disc and facet degenerative disease. Upper chest: Negative. Other: None. IMPRESSION: 1. No acute intracranial pathology. Small-vessel white matter disease and global volume loss in keeping with patient age. 2.  No displaced fracture of the facial bones. 3. No fracture or static subluxation of the cervical spine. Moderate multilevel disc and facet degenerative disease. Electronically Signed   By: Eddie Candle M.D.   On: 05/14/2018 16:03   Ct Cervical Spine Wo Contrast  Result Date: 05/14/2018 CLINICAL DATA:  Fall, anticoagulation EXAM: CT HEAD WITHOUT CONTRAST CT FACIAL BONES WITHOUT CONTRAST CT CERVICAL SPINE WITHOUT CONTRAST TECHNIQUE: Multidetector CT imaging of the head, facial bones and cervical spine was performed following the standard protocol without intravenous contrast. Multiplanar CT image reconstructions of the facial bones and cervical spine were also generated. COMPARISON:  11/21/2014 FINDINGS: CT HEAD FINDINGS Brain: No evidence of acute infarction, hemorrhage, hydrocephalus, extra-axial collection or mass lesion/mass effect. Periventricular white matter hypodensity and global volume loss. Vascular: No hyperdense vessel or unexpected calcification. CT FACIAL BONES FINDINGS Skull: Normal. Negative for fracture or focal lesion. Facial bones: No  displaced fractures or dislocations. Sinuses/Orbits: No acute finding. Other: None. CT CERVICAL SPINE FINDINGS Alignment: Degenerative straightening of the normal cervical lordosis. Skull base and vertebrae: No acute fracture. No primary bone lesion or focal pathologic process. Soft tissues and spinal canal: No prevertebral fluid or swelling. No visible canal hematoma. Disc levels: Moderate multilevel disc and facet degenerative disease. Upper chest: Negative. Other: None. IMPRESSION: 1. No acute intracranial pathology. Small-vessel white matter disease and global volume loss in keeping with patient age. 2.  No displaced fracture of the facial bones. 3. No fracture or static subluxation of the cervical spine. Moderate multilevel disc and facet degenerative disease. Electronically Signed   By: Eddie Candle M.D.   On: 05/14/2018 16:03   Dg Hips Bilat W Or Wo Pelvis 2 Views  Result Date: 05/14/2018 CLINICAL DATA:  [place] today with bilateral hip pain. EXAM: DG HIP (WITH OR WITHOUT PELVIS) 2V BILAT COMPARISON:  None. FINDINGS: There is no evidence of hip fracture or dislocation. There is no evidence of arthropathy or other focal bone abnormality. IMPRESSION: Negative. Electronically Signed   By: Nelson Chimes M.D.   On: 05/14/2018 16:29   Ct Maxillofacial Wo Contrast  Result Date: 05/14/2018 CLINICAL DATA:  Fall, anticoagulation EXAM: CT HEAD WITHOUT CONTRAST CT FACIAL BONES WITHOUT CONTRAST CT CERVICAL SPINE WITHOUT CONTRAST TECHNIQUE: Multidetector CT imaging of the head, facial bones and cervical spine was performed following the standard protocol without intravenous contrast. Multiplanar CT image reconstructions of the facial bones and cervical spine were also generated. COMPARISON:  11/21/2014 FINDINGS: CT HEAD FINDINGS Brain: No evidence of acute infarction, hemorrhage, hydrocephalus, extra-axial collection or mass lesion/mass effect. Periventricular white matter hypodensity and global volume loss. Vascular:  No hyperdense vessel or unexpected calcification. CT FACIAL BONES FINDINGS Skull: Normal. Negative for fracture or focal lesion. Facial bones: No displaced fractures or dislocations. Sinuses/Orbits: No acute finding. Other: None. CT CERVICAL SPINE FINDINGS Alignment: Degenerative straightening of the normal cervical lordosis. Skull base and vertebrae: No acute fracture. No primary bone lesion or focal pathologic process. Soft tissues and spinal canal: No prevertebral fluid or swelling. No visible canal hematoma. Disc levels: Moderate multilevel disc and facet degenerative disease.  Upper chest: Negative. Other: None. IMPRESSION: 1. No acute intracranial pathology. Small-vessel white matter disease and global volume loss in keeping with patient age. 2.  No displaced fracture of the facial bones. 3. No fracture or static subluxation of the cervical spine. Moderate multilevel disc and facet degenerative disease. Electronically Signed   By: Eddie Candle M.D.   On: 05/14/2018 16:03    EKG: Independently reviewed. Sinus rhythm, PAC.   Assessment/Plan   1. Syncope  - Presents following a suspected syncopal episode without prodrome  - She has hx of both intermittent CHB s/p pacer, and carotid hypersensitivity  - Sinus rhythm with PAC in EKG in ED; device interrogated in ED with no events reported; troponin undetectable x2  - Continue cardiac monitoring, check orthostatic vitals (though has already received a liter NS bolus in ED), and update echocardiogram  2. Complete heart block with pacer  - Patient has hx of intermittent CHB with syncope and is s/p pacer placement in 2013  - Device interrogated in ED and no events reported    3. Paroxysmal atrial fibrillation  - In sinus rhythm on admission  - CHADS-VASc at least 5 (age x2, gender, HTN, CAD)  - Continue Eliquis and diltiazem    4. Hypertension  - BP elevated in ED, possibly from pain  - Continue pain-control, diltiazem    5. CAD - No anginal  complaints  - Continue ASA and statin   6. Leukocytosis  - WBC elevated to 13,000 on admission   - Possibly reactive to facial trauma as no infectious etiology identified on ED-workup, interview, or exam  - Culture if febrile, repeat CBC in am     DVT prophylaxis: Eliquis  Code Status: Full  Family Communication: Close friend updated at bedside with patient permission  Consults called: Cardiology consulted by ED physician  Admission status: Observation     Vianne Bulls, MD [facility] Pager [phone]  If 7PM-7AM, please contact night-coverage [email or web address] Password [facility]  05/14/2018, 8:42 PM

## 2018-05-15 ENCOUNTER — Observation Stay (HOSPITAL_BASED_OUTPATIENT_CLINIC_OR_DEPARTMENT_OTHER): Payer: Medicare Other

## 2018-05-15 DIAGNOSIS — I480 Paroxysmal atrial fibrillation: Secondary | ICD-10-CM | POA: Diagnosis not present

## 2018-05-15 DIAGNOSIS — G9001 Carotid sinus syncope: Secondary | ICD-10-CM | POA: Diagnosis not present

## 2018-05-15 DIAGNOSIS — R55 Syncope and collapse: Secondary | ICD-10-CM | POA: Diagnosis not present

## 2018-05-15 DIAGNOSIS — I351 Nonrheumatic aortic (valve) insufficiency: Secondary | ICD-10-CM

## 2018-05-15 DIAGNOSIS — I442 Atrioventricular block, complete: Secondary | ICD-10-CM | POA: Diagnosis not present

## 2018-05-15 DIAGNOSIS — I10 Essential (primary) hypertension: Secondary | ICD-10-CM

## 2018-05-15 DIAGNOSIS — I2510 Atherosclerotic heart disease of native coronary artery without angina pectoris: Secondary | ICD-10-CM | POA: Diagnosis not present

## 2018-05-15 LAB — BASIC METABOLIC PANEL
Anion gap: 5 (ref 5–15)
BUN: 16 mg/dL (ref 8–23)
CO2: 27 mmol/L (ref 22–32)
Calcium: 8.1 mg/dL — ABNORMAL LOW (ref 8.9–10.3)
Chloride: 112 mmol/L — ABNORMAL HIGH (ref 98–111)
Creatinine, Ser: 0.85 mg/dL (ref 0.44–1.00)
GFR calc non Af Amer: >60 mL/min (ref >60)
Glucose, Bld: 108 mg/dL — ABNORMAL HIGH (ref 70–99)
Potassium: 3.5 mmol/L (ref 3.5–5.1)
Sodium: 144 mmol/L (ref 135–145)

## 2018-05-15 LAB — CBC WITH DIFFERENTIAL/PLATELET
Abs Immature Granulocytes: 0.03 10*3/uL (ref 0.00–0.07)
Basophils Absolute: 0.1 10*3/uL (ref 0.0–0.1)
Basophils Relative: 1 %
Eosinophils Absolute: 0.1 10*3/uL (ref 0.0–0.5)
Eosinophils Relative: 1 %
HCT: 38.1 % (ref 36.0–46.0)
Hemoglobin: 12.2 g/dL (ref 12.0–15.0)
Immature Granulocytes: 0 %
Lymphocytes Relative: 26 %
Lymphs Abs: 2.2 10*3/uL (ref 0.7–4.0)
MCH: 32.8 pg (ref 26.0–34.0)
MCHC: 32 g/dL (ref 30.0–36.0)
MCV: 102.4 fL — ABNORMAL HIGH (ref 80.0–100.0)
Monocytes Absolute: 1 10*3/uL (ref 0.1–1.0)
Monocytes Relative: 12 %
NEUTROS PCT: 60 %
Neutro Abs: 5.2 10*3/uL (ref 1.7–7.7)
Platelets: 213 10*3/uL (ref 150–400)
RBC: 3.72 MIL/uL — AB (ref 3.87–5.11)
RDW: 13 % (ref 11.5–15.5)
WBC: 8.5 10*3/uL (ref 4.0–10.5)
nRBC: 0 % (ref 0.0–0.2)

## 2018-05-15 LAB — ECHOCARDIOGRAM COMPLETE
Height: 62 in
Weight: 1814.4 oz

## 2018-05-15 LAB — GLUCOSE, CAPILLARY: GLUCOSE-CAPILLARY: 85 mg/dL (ref 70–99)

## 2018-05-15 NOTE — Progress Notes (Signed)
Pt ambulated 1 lap around [place] assistance. No equipment, or oxygen. Tolerated well.   Ken Bonn, Bing Neighbors, RN

## 2018-05-15 NOTE — Care Management Obs Status (Signed)
[place] NOTIFICATION   Patient Details  Name: Joy Newman MRN: [id number] Date of Birth: [birth year]   Medicare Observation Status Notification Given:  Yes    Purcell Mouton, RN 05/15/2018, 3:52 PM

## 2018-05-15 NOTE — Discharge Summary (Signed)
Physician Discharge Summary  Joy Newman ZOX:[id number] DOB: 1927/06/25 DOA: 05/14/2018  PCP: Haywood Pao, MD  Admit date: 05/14/2018 Discharge date: 05/15/2018  Admitted From: Home  Discharge disposition: Home   Recommendations for Outpatient Follow-Up:   Follow-up with your primary care physician in 1 week.  Follow-up with your cardiologist as scheduled.  Discharge Diagnosis:   Principal Problem:   Syncope Active Problems:   Carotid sinus hypersensitivity   Hypertension   Atrioventricular block, complete (HCC)   PAF (paroxysmal atrial fibrillation) (HCC)   CAD (coronary artery disease)    Discharge Condition: Improved.  Diet recommendation: Low-sodium.  Wound care: None.  Code status: Full.   History of Present Illness:   Joy Newman is a 85 y.o. female with medical history significant for heart block status post pacer placement, CAD, paroxysmal atrial fibrillation on Eliquis, and history of recurrent episodes with transient loss of consciousness, now presenting to emergency department after a fall with possible syncope.  Patient reports a long history of episodes where she falls without any prodrome, wakes up on the ground, and has undergone work-up notable for complete heart block and carotid sinus hypersensitivity.  She had a pacer placed in 2013 and last episode was approximately 2 years ago.  She seemed to be in her usual state this morning, was standing and conversing with a friend, and then woke on the ground.  She denies any prodrome and seemed to recover quickly.  There was no shaking or incontinence.  She denies chest pain or palpitations, denies lightheadedness on standing  Upon arrival to the ED, patient is found to be afebrile, saturating well on room air, and with systolic blood pressure of 180.  EKG features a sinus rhythm with PAC.  Chest x-ray is negative for acute cardiopulmonary disease.  CT head, maxillofacial, and cervical  spine are negative for acute pathology.  Chemistry panel is notable for an elevated albumin and CBC features a mild macrocytosis without anemia.  Troponin is undetectable x2.  Urinalysis unremarkable.  Patient was given a liter of normal saline and cardiology was consulted by the ED physician.  Pacemaker was interrogated in the ED and there were no events reported.  Hospitalists are asked to admit for further evaluation and management of recurrent syncope.    Hospital Course:   After admission to the hospital, patient was observed closely in telemetry monitor.  Pacemaker was interrogated without any significant arrhythmias.  Patient underwent 2D echocardiogram with preserved LV function.  Patient was able to ambulate without any issues.  She did not have any dizziness, lightheadedness orthostatic hypotension prior to discharge.  Patient did not have any chest pain and troponins were negative.  I spoke with cardiology regarding the patient and cardiology recommended outpatient follow-up.  He was encouraged to use her TED hose, take time to change positions to avoid orthostatic hypotension and avoid putting pressure in her neck.  Patient ambulated well in the hallway without need of any assistive device.   Medical Consultants:    None.   Discharge Exam:   Vitals:   05/15/18 1406 05/15/18 1409  BP: (!) 145/59 (!) 148/59  Pulse: 85 86  Resp:    Temp:    SpO2: 96% 96%   Vitals:   05/15/18 0408 05/15/18 1403 05/15/18 1406 05/15/18 1409  BP:  (!) 144/57 (!) 145/59 (!) 148/59  Pulse:  80 85 86  Resp:  16    Temp:  98.1 F (36.7 C)    TempSrc:  Oral    SpO2:  97% 96% 96%  Weight: 51.4 kg     Height:        General exam: Appears calm and comfortable.  Facial abrasions and ecchymosis noted Respiratory system: Clear to auscultation. Respiratory effort normal. Cardiovascular system: S1 & S2 heard, RRR. No JVD,  rubs, gallops or clicks. No murmurs. Gastrointestinal system: Abdomen is  nondistended, soft and nontender. No organomegaly or masses felt. Normal bowel sounds heard. Central nervous system: Alert and oriented. No focal neurological deficits. Extremities: No clubbing,  or cyanosis. No edema. Skin: No rashes, lesions or ulcers.  Facial abrasions noted Psychiatry: Judgement and insight appear normal. Mood & affect appropriate.    The results of significant diagnostics from this hospitalization (including imaging, microbiology, ancillary and laboratory) are listed below for reference.     Procedures and Diagnostic Studies:   Dg Chest 2 View  Result Date: 05/14/2018 CLINICAL DATA:  [place] today.  Hip pain. EXAM: CHEST - 2 VIEW COMPARISON:  11/20/2014 FINDINGS: Mild cardiac enlargement. Dual lead pacemaker with leads well positioned. Aortic atherosclerotic calcification. The pulmonary vascularity is normal. The lungs are clear. Degenerative changes affect the shoulders. Surgical clips in the right axilla. No acute bone finding. IMPRESSION: No active disease.  Mild cardiomegaly.  Pacemaker. Electronically Signed   By: Nelson Chimes M.D.   On: 05/14/2018 16:28   Ct Head Wo Contrast  Result Date: 05/14/2018 CLINICAL DATA:  Fall, anticoagulation EXAM: CT HEAD WITHOUT CONTRAST CT FACIAL BONES WITHOUT CONTRAST CT CERVICAL SPINE WITHOUT CONTRAST TECHNIQUE: Multidetector CT imaging of the head, facial bones and cervical spine was performed following the standard protocol without intravenous contrast. Multiplanar CT image reconstructions of the facial bones and cervical spine were also generated. COMPARISON:  11/21/2014 FINDINGS: CT HEAD FINDINGS Brain: No evidence of acute infarction, hemorrhage, hydrocephalus, extra-axial collection or mass lesion/mass effect. Periventricular white matter hypodensity and global volume loss. Vascular: No hyperdense vessel or unexpected calcification. CT FACIAL BONES FINDINGS Skull: Normal. Negative for fracture or focal lesion. Facial bones: No  displaced fractures or dislocations. Sinuses/Orbits: No acute finding. Other: None. CT CERVICAL SPINE FINDINGS Alignment: Degenerative straightening of the normal cervical lordosis. Skull base and vertebrae: No acute fracture. No primary bone lesion or focal pathologic process. Soft tissues and spinal canal: No prevertebral fluid or swelling. No visible canal hematoma. Disc levels: Moderate multilevel disc and facet degenerative disease. Upper chest: Negative. Other: None. IMPRESSION: 1. No acute intracranial pathology. Small-vessel white matter disease and global volume loss in keeping with patient age. 2.  No displaced fracture of the facial bones. 3. No fracture or static subluxation of the cervical spine. Moderate multilevel disc and facet degenerative disease. Electronically Signed   By: Eddie Candle M.D.   On: 05/14/2018 16:03   Ct Cervical Spine Wo Contrast  Result Date: 05/14/2018 CLINICAL DATA:  Fall, anticoagulation EXAM: CT HEAD WITHOUT CONTRAST CT FACIAL BONES WITHOUT CONTRAST CT CERVICAL SPINE WITHOUT CONTRAST TECHNIQUE: Multidetector CT imaging of the head, facial bones and cervical spine was performed following the standard protocol without intravenous contrast. Multiplanar CT image reconstructions of the facial bones and cervical spine were also generated. COMPARISON:  11/21/2014 FINDINGS: CT HEAD FINDINGS Brain: No evidence of acute infarction, hemorrhage, hydrocephalus, extra-axial collection or mass lesion/mass effect. Periventricular white matter hypodensity and global volume loss. Vascular: No hyperdense vessel or unexpected calcification. CT FACIAL BONES FINDINGS Skull: Normal. Negative for fracture or focal lesion. Facial bones: No displaced fractures or dislocations. Sinuses/Orbits: No acute finding.  Other: None. CT CERVICAL SPINE FINDINGS Alignment: Degenerative straightening of the normal cervical lordosis. Skull base and vertebrae: No acute fracture. No primary bone lesion or focal  pathologic process. Soft tissues and spinal canal: No prevertebral fluid or swelling. No visible canal hematoma. Disc levels: Moderate multilevel disc and facet degenerative disease. Upper chest: Negative. Other: None. IMPRESSION: 1. No acute intracranial pathology. Small-vessel white matter disease and global volume loss in keeping with patient age. 2.  No displaced fracture of the facial bones. 3. No fracture or static subluxation of the cervical spine. Moderate multilevel disc and facet degenerative disease. Electronically Signed   By: Eddie Candle M.D.   On: 05/14/2018 16:03   Dg Hips Bilat W Or Wo Pelvis 2 Views  Result Date: 05/14/2018 CLINICAL DATA:  [place] today with bilateral hip pain. EXAM: DG HIP (WITH OR WITHOUT PELVIS) 2V BILAT COMPARISON:  None. FINDINGS: There is no evidence of hip fracture or dislocation. There is no evidence of arthropathy or other focal bone abnormality. IMPRESSION: Negative. Electronically Signed   By: Nelson Chimes M.D.   On: 05/14/2018 16:29   Ct Maxillofacial Wo Contrast  Result Date: 05/14/2018 CLINICAL DATA:  Fall, anticoagulation EXAM: CT HEAD WITHOUT CONTRAST CT FACIAL BONES WITHOUT CONTRAST CT CERVICAL SPINE WITHOUT CONTRAST TECHNIQUE: Multidetector CT imaging of the head, facial bones and cervical spine was performed following the standard protocol without intravenous contrast. Multiplanar CT image reconstructions of the facial bones and cervical spine were also generated. COMPARISON:  11/21/2014 FINDINGS: CT HEAD FINDINGS Brain: No evidence of acute infarction, hemorrhage, hydrocephalus, extra-axial collection or mass lesion/mass effect. Periventricular white matter hypodensity and global volume loss. Vascular: No hyperdense vessel or unexpected calcification. CT FACIAL BONES FINDINGS Skull: Normal. Negative for fracture or focal lesion. Facial bones: No displaced fractures or dislocations. Sinuses/Orbits: No acute finding. Other: None. CT CERVICAL SPINE FINDINGS  Alignment: Degenerative straightening of the normal cervical lordosis. Skull base and vertebrae: No acute fracture. No primary bone lesion or focal pathologic process. Soft tissues and spinal canal: No prevertebral fluid or swelling. No visible canal hematoma. Disc levels: Moderate multilevel disc and facet degenerative disease. Upper chest: Negative. Other: None. IMPRESSION: 1. No acute intracranial pathology. Small-vessel white matter disease and global volume loss in keeping with patient age. 2.  No displaced fracture of the facial bones. 3. No fracture or static subluxation of the cervical spine. Moderate multilevel disc and facet degenerative disease. Electronically Signed   By: Eddie Candle M.D.   On: 05/14/2018 16:03     Labs:   Basic Metabolic Panel: Recent Labs  Lab 05/14/18 1612 05/15/18 0434  NA 141 144  K 4.1 3.5  CL 100 112*  CO2 30 27  GLUCOSE 105* 108*  BUN 19 16  CREATININE 0.89 0.85  CALCIUM 9.5 8.1*   GFR Estimated Creatinine Clearance: 36.2 mL/min (by C-G formula based on SCr of 0.85 mg/dL). Liver Function Tests: Recent Labs  Lab 05/14/18 1612  AST 33  ALT 17  ALKPHOS 66  BILITOT 0.5  PROT 7.9  ALBUMIN 5.2*   No results for input(s): LIPASE, AMYLASE in the last 168 hours. No results for input(s): AMMONIA in the last 168 hours. Coagulation profile No results for input(s): INR, PROTIME in the last 168 hours.  CBC: Recent Labs  Lab 05/14/18 1612 05/15/18 0434  WBC 13.0* 8.5  NEUTROABS 9.8* 5.2  HGB 14.8 12.2  HCT 46.8* 38.1  MCV 101.7* 102.4*  PLT 267 213   Cardiac Enzymes: Recent  Labs  Lab 05/14/18 1612 05/14/18 1923  TROPONINI <0.03 <0.03   BNP: Invalid input(s): POCBNP CBG: Recent Labs  Lab 05/15/18 0750  GLUCAP 85   D-Dimer No results for input(s): DDIMER in the last 72 hours. Hgb A1c No results for input(s): HGBA1C in the last 72 hours. Lipid Profile No results for input(s): CHOL, HDL, LDLCALC, TRIG, CHOLHDL, LDLDIRECT in the  last 72 hours. Thyroid function studies No results for input(s): TSH, T4TOTAL, T3FREE, THYROIDAB in the last 72 hours.  Invalid input(s): FREET3 Anemia work up No results for input(s): VITAMINB12, FOLATE, FERRITIN, TIBC, IRON, RETICCTPCT in the last 72 hours. Microbiology No results found for this or any previous visit (from the past 240 hour(s)).   Discharge Instructions:   Discharge Instructions    Diet - low sodium heart healthy   Complete by:  As directed    Discharge instructions   Complete by:  As directed    Take time to change positions.  Follow-up with your cardiology doctor as scheduled.  Increase the fluid intake.  Use TED hose.  Avoid any pressure on the neck   Increase activity slowly   Complete by:  As directed      Allergies as of 05/15/2018      Reactions   Imdur [isosorbide Mononitrate]    Intolerant and cause headaches and nausea   Isosorbide Nitrate Other (See Comments), Nausea Only   Polysporin [bacitracin-polymyxin B]    Rash   Quinine Derivatives    Increase in Heart Rate and Decrease in BP    Tetracyclines & Related    Rash      Medication List    TAKE these medications   apixaban 2.5 MG Tabs tablet Commonly known as:  ELIQUIS Take 1 tablet (2.5 mg total) by mouth 2 (two) times daily. Re-tart this medicine on 11/27/14   BIOTIN PO Take 600 mg by mouth 2 (two) times daily.   CALCIUM 600 + D PO Take 600 mg by mouth 2 (two) times daily.   diltiazem 180 MG 24 hr capsule Commonly known as:  CARDIZEM CD Take 1 capsule (180 mg total) by mouth every morning.   multivitamin with minerals Tabs tablet Take 1 tablet by mouth daily.   niacinamide 500 MG tablet Take 500 mg by mouth 2 (two) times daily with a meal.   OVER THE COUNTER MEDICATION Place 1 drop into both eyes daily. OTC eye drops for dry eyes   pantoprazole 40 MG tablet Commonly known as:  PROTONIX Take 40 mg by mouth daily.   rosuvastatin 5 MG tablet Commonly known as:   CRESTOR Take 5 mg by mouth daily.   vitamin C 1000 MG tablet Take 1,000 mg by mouth daily.   VITAMIN D3 PO Take 1 tablet by mouth daily.   Vitamin D 50 MCG (2000 UT) tablet Take 2,000 Units by mouth daily. VITAMIN D3 2000 MG       Time coordinating discharge: 39 minutes  Signed:  Queenie Aufiero  [facility] 05/15/2018, 2:49 PM

## 2018-05-16 ENCOUNTER — Encounter: Payer: Self-pay | Admitting: Cardiology

## 2018-05-16 NOTE — Telephone Encounter (Signed)
This encounter was created in error - please disregard.

## 2018-05-21 DIAGNOSIS — Z6820 Body mass index (BMI) 20.0-20.9, adult: Secondary | ICD-10-CM | POA: Diagnosis not present

## 2018-05-21 DIAGNOSIS — Z950 Presence of cardiac pacemaker: Secondary | ICD-10-CM | POA: Diagnosis not present

## 2018-05-21 DIAGNOSIS — R55 Syncope and collapse: Secondary | ICD-10-CM | POA: Diagnosis not present

## 2018-06-10 ENCOUNTER — Ambulatory Visit (INDEPENDENT_AMBULATORY_CARE_PROVIDER_SITE_OTHER): Payer: Medicare Other | Admitting: *Deleted

## 2018-06-10 DIAGNOSIS — I442 Atrioventricular block, complete: Secondary | ICD-10-CM | POA: Diagnosis not present

## 2018-06-10 DIAGNOSIS — I480 Paroxysmal atrial fibrillation: Secondary | ICD-10-CM

## 2018-06-12 LAB — CUP PACEART REMOTE DEVICE CHECK
Battery Remaining Longevity: 79 mo
Battery Remaining Percentage: 65 %
Battery Voltage: 2.9 V
Brady Statistic AP VP Percent: 1.8 %
Brady Statistic AP VS Percent: 20 %
Brady Statistic AS VP Percent: 2.3 %
Brady Statistic RA Percent Paced: 19 %
Brady Statistic RV Percent Paced: 5 %
Date Time Interrogation Session: 20200309135347
Implantable Lead Implant Date: 20130110
Implantable Lead Location: 753859
Implantable Lead Location: 753860
Implantable Pulse Generator Implant Date: 20130110
Lead Channel Impedance Value: 360 Ohm
Lead Channel Impedance Value: 410 Ohm
Lead Channel Pacing Threshold Amplitude: 1.25 V
Lead Channel Pacing Threshold Pulse Width: 0.4 ms
Lead Channel Pacing Threshold Pulse Width: 0.4 ms
Lead Channel Sensing Intrinsic Amplitude: 12 mV
Lead Channel Sensing Intrinsic Amplitude: 3.1 mV
Lead Channel Setting Pacing Amplitude: 2 V
Lead Channel Setting Pacing Amplitude: 2.5 V
Lead Channel Setting Pacing Pulse Width: 0.4 ms
Lead Channel Setting Sensing Sensitivity: 4.5 mV
MDC IDC LEAD IMPLANT DT: 20130110
MDC IDC MSMT LEADCHNL RA PACING THRESHOLD AMPLITUDE: 0.5 V
MDC IDC STAT BRADY AS VS PERCENT: 75 %
Pulse Gen Model: 2210
Pulse Gen Serial Number: 7303108

## 2018-06-18 NOTE — Progress Notes (Signed)
Remote pacemaker transmission.   

## 2018-07-11 ENCOUNTER — Telehealth: Payer: Self-pay | Admitting: Neurology

## 2018-07-11 NOTE — Telephone Encounter (Signed)
I called and spoke with patient regarding her VV. She told me that the more she has thought about it, the more she feels that it may be best to hold off and wait until she can come into the office for this visit. She would like me to make sure this would be ok? Could you advise and I will be happy to call her back?

## 2018-07-11 NOTE — Telephone Encounter (Signed)
Pt called in for a update of her appt on 07/17/2018 I advised patient of COVID-19 precautions. Pt has a computer and states she will be able to do the video visit but she will need her son to set it up due to her not being computer savy.

## 2018-07-11 NOTE — Telephone Encounter (Signed)
I called and scheduled the patient for June 15th. DW

## 2018-07-11 NOTE — Telephone Encounter (Signed)
Yes ok to reschedule for June when hopefully things will be better and we can have her into office.

## 2018-07-17 ENCOUNTER — Ambulatory Visit: Payer: Medicare Other | Admitting: Neurology

## 2018-08-08 ENCOUNTER — Telehealth: Payer: Self-pay

## 2018-08-08 NOTE — Telephone Encounter (Signed)

## 2018-08-08 NOTE — Telephone Encounter (Signed)
I see that patient has switched over to a telehealth visit. We need consent to bill insurance for visit.

## 2018-08-08 NOTE — Telephone Encounter (Signed)
I called and left patient a message about switching 08/12/18 office visit to a telehealth visit.

## 2018-08-08 NOTE — Telephone Encounter (Signed)
New Message     Pt is calling and is giving consent.   Please call back for additional information if needed

## 2018-08-12 ENCOUNTER — Encounter: Payer: Medicare Other | Admitting: Internal Medicine

## 2018-08-16 ENCOUNTER — Other Ambulatory Visit: Payer: Self-pay

## 2018-08-16 ENCOUNTER — Telehealth (INDEPENDENT_AMBULATORY_CARE_PROVIDER_SITE_OTHER): Payer: Medicare Other | Admitting: Internal Medicine

## 2018-08-16 ENCOUNTER — Encounter: Payer: Self-pay | Admitting: Internal Medicine

## 2018-08-16 VITALS — BP 140/60 | Ht 62.0 in | Wt 108.0 lb

## 2018-08-16 DIAGNOSIS — I442 Atrioventricular block, complete: Secondary | ICD-10-CM

## 2018-08-16 DIAGNOSIS — Z950 Presence of cardiac pacemaker: Secondary | ICD-10-CM

## 2018-08-16 NOTE — Progress Notes (Signed)
Electrophysiology TeleHealth Note   Due to national recommendations of social distancing due to COVID 19, an audio/video telehealth visit is felt to be most appropriate for this patient at this time. The patient did not have access to video technology/had technical difficulties with video requiring transitioning to audio format only (telephone).  All issues noted in this document were discussed and addressed.  No physical exam could be performed with this format.     See MyChart message from today for the patient's consent to telehealth for [facility].   Date:  08/16/2018   ID:  Joy Newman, DOB May 07, 1927, MRN [id number]  Location: patient's home  Provider location: [address], [place]  Evaluation Performed: Follow-up visit  PCP:  Tisovec, Fransico Him, MD  Cardiologist:   *  Electrophysiologist:  SK   Chief Complaint:   History of Present Illness:    Joy Newman is a 85 y.o. female who presents via audio/video conferencing for a telehealth visit today.  Since last being seen in our clinic, the patient reports  Interval syncope prompting ER eval 2/20;  Hospital orthostatics were abnormal ( HR 56>>92 with standing the first day and 82>>86 the second day)  Date Cr K Hgb  2/20* 0.85 3.5 12.2 (MCV 102)         She has no recollection of prodrome, indeed without prompting had no recollection ( nor desire to recount) the event  The patient denies chest pain, shortness of breath, nocturnal dyspnea, orthopnea or peripheral edema.  There have been no palpitations, lightheadedness or syncope.    The patient denies symptoms of fevers, chills, cough, or new SOB worrisome for COVID 19.    Past Medical History:  Diagnosis Date  . Arteriovenous malformation of colon   . Basal cell carcinoma   . CAD (coronary artery disease) no obstruction at cath    Hx of with an anteroapical M.I  . Carotid sinus hypersensitivity   . Complete heart  block-intermittent   . Diastolic dysfunction   . Diverticulosis   . Hyperlipidemia   . Hypertension   . Kidney stones   . Melanoma ([place])   . MVP (mitral valve prolapse)   . Myocardial infarction ([place])    2005  . Orthostasis   . Pacemaker    St Jude  . Pulmonary hypertension (HCC)     Hx of Mild  . PVC and PACs   . Squamous carcinoma   . Syncope   . Tricuspid regurgitation    Moderate regurgitation  . Tubular adenoma of colon 02/2012    Past Surgical History:  Procedure Laterality Date  . ABDOMINAL HYSTERECTOMY  1980   Hx of  . APPENDECTOMY  1954   Hx of  . CARDIAC CATHETERIZATION     Revealed an apical wall motion abnormality  . CARDIOVASCULAR STRESS TEST  02-21-2006   EF 84%, which revealed an apical defect  . NASAL RECONSTRUCTION  1981  . PACEMAKER INSERTION    . PERMANENT PACEMAKER INSERTION N/A 04/13/2011   Procedure: PERMANENT PACEMAKER INSERTION;  Surgeon: Evans Lance, MD;  Location: [facility] C[facility] LAB;  Service: Cardiovascular;  Laterality: N/A;  . SKIN CANCER EXCISION     several, basal cell, squamous, melanoma  . TONSILLECTOMY  1971   Hx of    Current Outpatient Medications  Medication Sig Dispense Refill  . apixaban (ELIQUIS) 2.5 MG TABS tablet Take 1 tablet (2.5 mg total) by mouth 2 (two) times daily. Re-tart this medicine  on 11/27/14 60 tablet 11  . Calcium Carb-Cholecalciferol (CALCIUM 600 + D PO) Take 600 mg by mouth 2 (two) times daily.    Marland Kitchen diltiazem (CARDIZEM CD) 180 MG 24 hr capsule Take 1 capsule (180 mg total) by mouth every morning.    . Multiple Vitamin (MULTIVITAMIN WITH MINERALS) TABS tablet Take 1 tablet by mouth daily.    Marland Kitchen OVER THE COUNTER MEDICATION Place 1 drop into both eyes daily. OTC eye drops for dry eyes    . pantoprazole (PROTONIX) 40 MG tablet Take 40 mg by mouth daily.     . rosuvastatin (CRESTOR) 5 MG tablet Take 5 mg by mouth daily.     No current facility-administered medications for this visit.     Allergies:   Imdur  [isosorbide mononitrate]; Isosorbide nitrate; Polysporin [bacitracin-polymyxin b]; Quinine derivatives; and Tetracyclines & related   Social History:  The patient  reports that she has never smoked. She has never used smokeless tobacco. She reports current alcohol use. She reports that she does not use drugs.   Family History:  The patient's   family history includes Heart disease in her brother; Kidney disease in her father and sister; Liver cancer in her sister; Lung cancer in her brother; Stroke in her brother and mother.   ROS:  Please see the history of present illness.   All other systems are personally reviewed and negative.    Exam:    Vital Signs:  BP 140/60   Ht 5\' 2"  (1.575 m)   Wt 108 lb (49 kg)   BMI 19.75 kg/m        Labs/Other Tests and Data Reviewed:    Recent Labs: 05/14/2018: ALT 17 05/15/2018: BUN 16; Creatinine, Ser 0.85; Hemoglobin 12.2; Platelets 213; Potassium 3.5; Sodium 144   Wt Readings from Last 3 Encounters:  08/16/18 108 lb (49 kg)  05/15/18 113 lb 6.4 oz (51.4 kg)  08/09/17 116 lb (52.6 kg)     Other studies personally reviewed: Additional studies/ records that were reviewed today include:  Hospital records   Last device remote is reviewed from [facility] PDF dated 3/20 which reveals normal device function,   arrhythmias - none noise reversion with false positive afib detection    ASSESSMENT & PLAN:    Atrial fibrillation-paroxysmal  Pacemaker-St. Jude  .        Syncope-carotid sinus hypersensitivity  Intermittent complete heart block  Nose reversion-atrial/ventricular lead  Interval syncope   Initial measurements at hospital with delta HR suggested vasomotor as the likely mechanism, Unassociated with prodrome  Working on staying hydrated;   Not particularly interested in talking about that event -- wants to look forward  Yeah  But:((       COVID 19 screen The patient denies symptoms of COVID 19 at this time.  The importance of  social distancing was discussed today.  Follow-up:  53M Next remote: As Scheduled   Current medicines are reviewed at length with the patient today.   The patient does not have concerns regarding her medicines.  The following changes were made today:  none  Labs/ tests ordered today include:   No orders of the defined types were placed in this encounter.   Future tests ( post COVID )     Patient Risk:  after full review of this patients clinical status, I feel that they are at moderate risk at this time.  Today, I have spent  12 minutes with the patient with telehealth technology discussing the  above.  Signed, Virl Axe, MD  08/16/2018 2:01 PM     [facility] [address] [place] [address] [phone] (office) [phone] (fax)

## 2018-09-09 ENCOUNTER — Ambulatory Visit (INDEPENDENT_AMBULATORY_CARE_PROVIDER_SITE_OTHER): Payer: Medicare Other | Admitting: *Deleted

## 2018-09-09 DIAGNOSIS — I442 Atrioventricular block, complete: Secondary | ICD-10-CM | POA: Diagnosis not present

## 2018-09-09 DIAGNOSIS — I480 Paroxysmal atrial fibrillation: Secondary | ICD-10-CM

## 2018-09-09 LAB — CUP PACEART REMOTE DEVICE CHECK
Battery Remaining Longevity: 89 mo
Battery Remaining Percentage: 73 %
Battery Voltage: 2.92 V
Brady Statistic AP VP Percent: 2.1 %
Brady Statistic AP VS Percent: 22 %
Brady Statistic AS VP Percent: 2.4 %
Brady Statistic AS VS Percent: 72 %
Brady Statistic RA Percent Paced: 22 %
Brady Statistic RV Percent Paced: 5.2 %
Date Time Interrogation Session: 20200608070900
Implantable Lead Implant Date: 20130110
Implantable Lead Implant Date: 20130110
Implantable Lead Location: 753859
Implantable Lead Location: 753860
Implantable Pulse Generator Implant Date: 20130110
Lead Channel Impedance Value: 380 Ohm
Lead Channel Impedance Value: 440 Ohm
Lead Channel Pacing Threshold Amplitude: 0.5 V
Lead Channel Pacing Threshold Amplitude: 1.25 V
Lead Channel Pacing Threshold Pulse Width: 0.4 ms
Lead Channel Pacing Threshold Pulse Width: 0.4 ms
Lead Channel Sensing Intrinsic Amplitude: 12 mV
Lead Channel Sensing Intrinsic Amplitude: 2.6 mV
Lead Channel Setting Pacing Amplitude: 2 V
Lead Channel Setting Pacing Amplitude: 2.5 V
Lead Channel Setting Pacing Pulse Width: 0.4 ms
Lead Channel Setting Sensing Sensitivity: 4.5 mV
Pulse Gen Model: 2210
Pulse Gen Serial Number: 7303108

## 2018-09-11 ENCOUNTER — Telehealth: Payer: Self-pay | Admitting: *Deleted

## 2018-09-11 NOTE — Telephone Encounter (Signed)
Spoke with pt regarding 09/16/2018 2:30 pm appt. Pt stated she is comfortable coming in given the risks associated with COVID19, her age, and medical history. Discussed the check-in process with pt (temp, screening questions, drive-up check-in). She will bring her own mask. Pt passed the [place] screening questions today. She will have a ride to the appt.

## 2018-09-12 ENCOUNTER — Encounter: Payer: Self-pay | Admitting: *Deleted

## 2018-09-16 ENCOUNTER — Ambulatory Visit (INDEPENDENT_AMBULATORY_CARE_PROVIDER_SITE_OTHER): Payer: Medicare Other | Admitting: Neurology

## 2018-09-16 ENCOUNTER — Ambulatory Visit: Payer: Medicare Other | Admitting: Neurology

## 2018-09-16 ENCOUNTER — Encounter: Payer: Self-pay | Admitting: Neurology

## 2018-09-16 ENCOUNTER — Telehealth: Payer: Self-pay

## 2018-09-16 VITALS — BP 154/70 | HR 55 | Temp 98.6°F | Ht 62.0 in | Wt 110.0 lb

## 2018-09-16 DIAGNOSIS — Z5181 Encounter for therapeutic drug level monitoring: Secondary | ICD-10-CM | POA: Diagnosis not present

## 2018-09-16 DIAGNOSIS — R55 Syncope and collapse: Secondary | ICD-10-CM

## 2018-09-16 NOTE — Telephone Encounter (Signed)
Per Dr. Jaynee Eagles Ms [place] needed to be rescheduled due to being out of the office of 09/16/18. Dr. Jannifer Franklin had an opening at 12 pm and 4 pm. Pt states her son will be bringing her to the office and she would accept the 4 pm at this time but will call back if her son could not accommodate this time frame. Pt was advised this would be ok.

## 2018-09-16 NOTE — Progress Notes (Signed)
Reason for visit: Recurrent syncope  Referring physician: Dr. Zenon Mayo is a 85 y.o. female  History of present illness:  Joy Newman is an 85 year old right-handed white female with a history of recurrent syncopal events.  The patient was admitted to the hospital on 14 May 2018 with an event of syncope.  The patient has a history of heart block, status post pacemaker placement and a history of coronary artery disease.  She has atrial fibrillation and is on Eliquis for this reason.  The patient had a cardiac pacemaker placed in 2013, but she has continued to have episodic syncope.  The patient comes in today with a family friend, he believes that she has had at least 4 or 5 syncopal events in the last 8 years.  The last syncopal event was associated with an unprotected fall, the patient had facial trauma.  The patient believes that the blackout was very brief, she generally has no warning when the blackout is going to take place.  She does not have any dizziness or diaphoresis, chest pain or shortness of breath.  The pacemaker was interrogated, no cardiac rhythm abnormality was noted.  The patient for this reason is sent to this office for neurologic evaluation.  The patient generally drinks plenty of fluids and tries to stay hydrated.  She reports no focal numbness or weakness of the face, arms, legs.  She denies any dizziness or headache issues.  The patient currently is not operating motor vehicle.  She does live alone.  She has been witnessed on occasion to have staring episodes without overt syncope.  Past Medical History:  Diagnosis Date  . Arteriovenous malformation of colon   . Basal cell carcinoma   . CAD (coronary artery disease) no obstruction at cath    Hx of with an anteroapical M.I  . Carotid sinus hypersensitivity   . Complete heart block-intermittent   . Diastolic dysfunction   . Diverticulosis   . Fall 05/2018  . Heart disease   .  Hyperlipidemia   . Hypertension   . Kidney stones   . Melanoma ([place])   . MVP (mitral valve prolapse)   . Myocardial infarction ([place])    2005  . Orthostasis   . Pacemaker    St Jude  . Pulmonary hypertension (HCC)     Hx of Mild  . PVC and PACs   . Squamous carcinoma   . Syncope   . Tricuspid regurgitation    Moderate regurgitation  . Tubular adenoma of colon 02/2012    Past Surgical History:  Procedure Laterality Date  . ABDOMINAL HYSTERECTOMY  1980   Hx of  . APPENDECTOMY  1954   Hx of  . CARDIAC CATHETERIZATION     Revealed an apical wall motion abnormality  . CARDIOVASCULAR STRESS TEST  02-21-2006   EF 84%, which revealed an apical defect  . CATARACT EXTRACTION Bilateral   . NASAL RECONSTRUCTION  1981  . PACEMAKER INSERTION    . PERMANENT PACEMAKER INSERTION N/A 04/13/2011   Procedure: PERMANENT PACEMAKER INSERTION;  Surgeon: Evans Lance, MD;  Location: [facility] CATH LAB;  Service: Cardiovascular;  Laterality: N/A;  . SKIN CANCER EXCISION     several, basal cell, squamous, melanoma  . TONSILLECTOMY  1971   Hx of    Family History  Problem Relation Age of Onset  . Stroke Mother   . Kidney disease Father   . Lung cancer Brother   . Kidney disease Sister   .  Stroke Brother   . Heart disease Brother        x 2  . Liver cancer Sister   . Breast cancer Neg Hx     Social history:  reports that she has never smoked. She has never used smokeless tobacco. She reports current alcohol use. She reports that she does not use drugs.  Medications:  Prior to Admission medications   Medication Sig Start Date End Date Taking? Authorizing Provider  apixaban (ELIQUIS) 2.5 MG TABS tablet Take 1 tablet (2.5 mg total) by mouth 2 (two) times daily. Re-tart this medicine on 11/27/14 11/21/14  Yes Nita Sells, MD  buPROPion (WELLBUTRIN) 75 MG tablet Take 75 mg by mouth. 1/2 tab in the am and 1/2 tab in the pm.   Yes [provider]  Calcium Carb-Cholecalciferol  (CALCIUM 600 + D PO) Take 600 mg by mouth 2 (two) times daily.   Yes [provider]  diltiazem (CARDIZEM CD) 180 MG 24 hr capsule Take 1 capsule (180 mg total) by mouth every morning. 11/21/14  Yes Nita Sells, MD  Multiple Vitamin (MULTIVITAMIN WITH MINERALS) TABS tablet Take 1 tablet by mouth daily.   Yes [provider]  pantoprazole (PROTONIX) 40 MG tablet Take 40 mg by mouth daily.    Yes [provider]  rosuvastatin (CRESTOR) 5 MG tablet Take 5 mg by mouth daily.   Yes [provider]      Allergies  Allergen Reactions  . Imdur [Isosorbide Mononitrate]     Intolerant and cause headaches and nausea  . Isosorbide Nitrate Other (See Comments) and Nausea Only  . Polysporin [Bacitracin-Polymyxin B]     Rash  . Quinine Derivatives     Increase in Heart Rate and Decrease in BP   . Tetracyclines & Related     Rash    ROS:  Out of a complete 14 system review of symptoms, the patient complains only of the following symptoms, and all other reviewed systems are negative.  Moles  leg cramps   Blood pressure (!) 154/70, pulse (!) 55, temperature 98.6 F (37 C), temperature source Oral, height 5\' 2"  (1.575 m), weight 110 lb (49.9 kg).  Physical Exam  General: The patient is alert and cooperative at the time of the examination.  Eyes: Pupils are equal, round, and reactive to light. Discs are flat bilaterally.  Neck: The neck is supple, no carotid bruits are noted.  Respiratory: The respiratory examination is clear.  Cardiovascular: The cardiovascular examination reveals a regular rate and rhythm, no obvious murmurs or rubs are noted.  Skin: Extremities are without significant edema.  Neurologic Exam  Mental status: The patient is alert and oriented x 3 at the time of the examination. The patient has apparent normal recent and remote memory, with an apparently normal attention span and concentration ability.  Cranial nerves: Facial  symmetry is present. There is good sensation of the face to pinprick and soft touch bilaterally. The strength of the facial muscles and the muscles to head turning and shoulder shrug are normal bilaterally. Speech is well enunciated, no aphasia or dysarthria is noted. Extraocular movements are full. Visual fields are full. The tongue is midline, and the patient has symmetric elevation of the soft palate. No obvious hearing deficits are noted.  Motor: The motor testing reveals 5 over 5 strength of all 4 extremities. Good symmetric motor tone is noted throughout.  Sensory: Sensory testing is intact to pinprick, soft touch, vibration sensation, and position sense  on all 4 extremities. No evidence of extinction is noted.  Coordination: Cerebellar testing reveals good finger-nose-finger and heel-to-shin bilaterally.  Gait and station: Gait is normal. Tandem gait is slightly unsteady. Romberg is negative. No drift is seen.  Reflexes: Deep tendon reflexes are symmetric and normal bilaterally. Toes are downgoing bilaterally.   Assessment/Plan:  1.  Recurrent syncope  The patient does have a cardiac rhythm issues, but she has had a pacemaker placed and no cardiac rhythm problems have been noted during her recent episodes of syncope.  The patient is not able to operate a motor vehicle currently.  The patient will be set up for CT angiogram of the head and neck, blood work will be done today.  She will have EEG evaluation.  I will contact her with the results of the above studies.  Joy Alexanders MD 09/16/2018 3:58 PM  [facility] [address] [place], [place] [address]  Phone [phone] Fax [phone]

## 2018-09-17 ENCOUNTER — Telehealth: Payer: Self-pay

## 2018-09-17 ENCOUNTER — Telehealth: Payer: Self-pay | Admitting: Neurology

## 2018-09-17 LAB — COMPREHENSIVE METABOLIC PANEL
ALT: 15 IU/L (ref 0–32)
AST: 27 IU/L (ref 0–40)
Albumin/Globulin Ratio: 2.1 (ref 1.2–2.2)
Albumin: 4.8 g/dL — ABNORMAL HIGH (ref 3.6–4.6)
Alkaline Phosphatase: 69 IU/L (ref 39–117)
BUN/Creatinine Ratio: 16 (ref 12–28)
BUN: 14 mg/dL (ref 8–27)
Bilirubin Total: 0.3 mg/dL (ref 0.0–1.2)
CO2: 27 mmol/L (ref 20–29)
Calcium: 10 mg/dL (ref 8.7–10.3)
Chloride: 100 mmol/L (ref 96–106)
Creatinine, Ser: 0.85 mg/dL (ref 0.57–1.00)
GFR calc Af Amer: 71 mL/min/{1.73_m2} (ref >59)
GFR calc non Af Amer: 61 mL/min/{1.73_m2} (ref >59)
Globulin, Total: 2.3 g/dL (ref 1.5–4.5)
Glucose: 90 mg/dL (ref 65–99)
Potassium: 4.4 mmol/L (ref 3.5–5.2)
Sodium: 141 mmol/L (ref 134–144)
Total Protein: 7.1 g/dL (ref 6.0–8.5)

## 2018-09-17 NOTE — Progress Notes (Signed)
Remote pacemaker transmission.   

## 2018-09-17 NOTE — Telephone Encounter (Signed)
-----   Message from Kathrynn Ducking, MD sent at 09/17/2018  7:17 AM EDT ----- Blood work is unremarkable except above slightly elevated albumin level, likely of no clinical significance.  Please call the patient. ----- Message ----- From: Lavone Neri Lab Results In Sent: 09/17/2018   5:37 AM EDT To: Kathrynn Ducking, MD

## 2018-09-17 NOTE — Telephone Encounter (Signed)
I reached out to the pt and advised of lab results. She voiced understanding and had no questions at this time.

## 2018-09-17 NOTE — Telephone Encounter (Signed)
Medicare order sent to GI. No auth they will reach out to the patient to schedule.  

## 2018-09-23 ENCOUNTER — Other Ambulatory Visit: Payer: Self-pay

## 2018-09-23 ENCOUNTER — Telehealth: Payer: Self-pay | Admitting: Neurology

## 2018-09-23 ENCOUNTER — Ambulatory Visit (INDEPENDENT_AMBULATORY_CARE_PROVIDER_SITE_OTHER): Payer: Medicare Other | Admitting: Neurology

## 2018-09-23 DIAGNOSIS — R55 Syncope and collapse: Secondary | ICD-10-CM

## 2018-09-23 NOTE — Procedures (Signed)
    History:  Joy Newman is an 85 year old patient with a history of recurring episodes of syncope.  The patient does have a history of heart block and has had a pacemaker placed, but recent episodes of syncope have not been associated with any cardiac rhythm abnormalities.  The patient is being evaluated for these events.  The episodes of loss of consciousness are very brief without warning.  This is a routine EEG study.  No skull defects are noted.  Medications include Eliquis, Wellbutrin, calcium supplementation, multivitamins, Protonix, and Crestor.  EEG classification: Normal awake  Description of the recording: The background rhythms of this recording consists of a fairly well modulated medium amplitude alpha rhythm of 8 Hz that is reactive to eye opening and closure. As the record progresses, the patient appears to remain in the waking state throughout the recording. Photic stimulation was performed, resulting in a bilateral and symmetric photic driving response. Hyperventilation was also performed, resulting in a minimal buildup of the background rhythm activities without significant slowing seen. At no time during the recording does there appear to be evidence of spike or spike wave discharges or evidence of focal slowing. EKG monitor shows no evidence of cardiac rhythm abnormalities with a heart rate of 66.  Impression: This is a normal EEG recording in the waking state. No evidence of ictal or interictal discharges are seen.

## 2018-09-23 NOTE — Telephone Encounter (Signed)
I called the patient.  The EEG study done today was unremarkable, CT angiogram of the head and neck is pending.

## 2018-10-08 ENCOUNTER — Ambulatory Visit
Admission: RE | Admit: 2018-10-08 | Discharge: 2018-10-08 | Disposition: A | Discharge status: Home / Self Care | Payer: Medicare Other | Source: Ambulatory Visit | Attending: Neurology | Admitting: Neurology

## 2018-10-08 ENCOUNTER — Telehealth: Payer: Self-pay | Admitting: Neurology

## 2018-10-08 ENCOUNTER — Other Ambulatory Visit: Payer: Self-pay

## 2018-10-08 DIAGNOSIS — R55 Syncope and collapse: Secondary | ICD-10-CM

## 2018-10-08 DIAGNOSIS — R413 Other amnesia: Secondary | ICD-10-CM | POA: Diagnosis not present

## 2018-10-08 DIAGNOSIS — R42 Dizziness and giddiness: Secondary | ICD-10-CM | POA: Diagnosis not present

## 2018-10-08 MED ORDER — IOPAMIDOL (ISOVUE-370) INJECTION 76%
75.0000 mL | Freq: Once | INTRAVENOUS | Status: AC | PRN
  Administered 2018-10-08: 75 mL via INTRAVENOUS

## 2018-10-08 NOTE — Telephone Encounter (Signed)
I called the patient.  The CT angiogram of the head and neck was unremarkable.  No evidence of a source of syncopal events.    CTA head and neck 10/08/18:  IMPRESSION: 1. No emergent large vessel occlusion or hemodynamically significant stenosis. 2. Mild right carotid bifurcation and aortic atherosclerosis (ICD10-I70.0).

## 2018-11-06 DIAGNOSIS — H353131 Nonexudative age-related macular degeneration, bilateral, early dry stage: Secondary | ICD-10-CM | POA: Diagnosis not present

## 2018-11-06 DIAGNOSIS — Z961 Presence of intraocular lens: Secondary | ICD-10-CM | POA: Diagnosis not present

## 2018-11-06 DIAGNOSIS — H40243 Residual stage of angle-closure glaucoma, bilateral: Secondary | ICD-10-CM | POA: Diagnosis not present

## 2018-11-06 DIAGNOSIS — H524 Presbyopia: Secondary | ICD-10-CM | POA: Diagnosis not present

## 2018-12-10 ENCOUNTER — Ambulatory Visit (INDEPENDENT_AMBULATORY_CARE_PROVIDER_SITE_OTHER): Payer: Medicare Other | Admitting: *Deleted

## 2018-12-10 DIAGNOSIS — I442 Atrioventricular block, complete: Secondary | ICD-10-CM

## 2018-12-10 LAB — CUP PACEART REMOTE DEVICE CHECK
Battery Remaining Longevity: 79 mo
Battery Remaining Percentage: 65 %
Battery Voltage: 2.9 V
Brady Statistic AP VP Percent: 2 %
Brady Statistic AP VS Percent: 22 %
Brady Statistic AS VP Percent: 2.4 %
Brady Statistic AS VS Percent: 72 %
Brady Statistic RA Percent Paced: 22 %
Brady Statistic RV Percent Paced: 5 %
Date Time Interrogation Session: 20200907074517
Implantable Lead Implant Date: 20130110
Implantable Lead Implant Date: 20130110
Implantable Lead Location: 753859
Implantable Lead Location: 753860
Implantable Pulse Generator Implant Date: 20130110
Lead Channel Impedance Value: 390 Ohm
Lead Channel Impedance Value: 430 Ohm
Lead Channel Pacing Threshold Amplitude: 0.5 V
Lead Channel Pacing Threshold Amplitude: 1.25 V
Lead Channel Pacing Threshold Pulse Width: 0.4 ms
Lead Channel Pacing Threshold Pulse Width: 0.4 ms
Lead Channel Sensing Intrinsic Amplitude: 12 mV
Lead Channel Sensing Intrinsic Amplitude: 2 mV
Lead Channel Setting Pacing Amplitude: 2 V
Lead Channel Setting Pacing Amplitude: 2.5 V
Lead Channel Setting Pacing Pulse Width: 0.4 ms
Lead Channel Setting Sensing Sensitivity: 4.5 mV
Pulse Gen Model: 2210
Pulse Gen Serial Number: 7303108

## 2018-12-25 ENCOUNTER — Encounter: Payer: Self-pay | Admitting: Cardiology

## 2018-12-25 NOTE — Progress Notes (Signed)
Remote pacemaker transmission.   

## 2019-02-11 DIAGNOSIS — M810 Age-related osteoporosis without current pathological fracture: Secondary | ICD-10-CM | POA: Diagnosis not present

## 2019-02-11 DIAGNOSIS — E7800 Pure hypercholesterolemia, unspecified: Secondary | ICD-10-CM | POA: Diagnosis not present

## 2019-02-18 DIAGNOSIS — E559 Vitamin D deficiency, unspecified: Secondary | ICD-10-CM | POA: Diagnosis not present

## 2019-02-18 DIAGNOSIS — I4821 Permanent atrial fibrillation: Secondary | ICD-10-CM | POA: Diagnosis not present

## 2019-02-18 DIAGNOSIS — D126 Benign neoplasm of colon, unspecified: Secondary | ICD-10-CM | POA: Diagnosis not present

## 2019-02-18 DIAGNOSIS — Z1331 Encounter for screening for depression: Secondary | ICD-10-CM | POA: Diagnosis not present

## 2019-02-18 DIAGNOSIS — R82998 Other abnormal findings in urine: Secondary | ICD-10-CM | POA: Diagnosis not present

## 2019-02-18 DIAGNOSIS — E7800 Pure hypercholesterolemia, unspecified: Secondary | ICD-10-CM | POA: Diagnosis not present

## 2019-02-18 DIAGNOSIS — M810 Age-related osteoporosis without current pathological fracture: Secondary | ICD-10-CM | POA: Diagnosis not present

## 2019-02-18 DIAGNOSIS — Z23 Encounter for immunization: Secondary | ICD-10-CM | POA: Diagnosis not present

## 2019-02-18 DIAGNOSIS — D6869 Other thrombophilia: Secondary | ICD-10-CM | POA: Diagnosis not present

## 2019-02-18 DIAGNOSIS — Z7901 Long term (current) use of anticoagulants: Secondary | ICD-10-CM | POA: Diagnosis not present

## 2019-02-18 DIAGNOSIS — N182 Chronic kidney disease, stage 2 (mild): Secondary | ICD-10-CM | POA: Diagnosis not present

## 2019-02-18 DIAGNOSIS — Z950 Presence of cardiac pacemaker: Secondary | ICD-10-CM | POA: Diagnosis not present

## 2019-02-18 DIAGNOSIS — I951 Orthostatic hypotension: Secondary | ICD-10-CM | POA: Diagnosis not present

## 2019-02-18 DIAGNOSIS — I1310 Hypertensive heart and chronic kidney disease without heart failure, with stage 1 through stage 4 chronic kidney disease, or unspecified chronic kidney disease: Secondary | ICD-10-CM | POA: Diagnosis not present

## 2019-02-18 DIAGNOSIS — Z1339 Encounter for screening examination for other mental health and behavioral disorders: Secondary | ICD-10-CM | POA: Diagnosis not present

## 2019-02-18 DIAGNOSIS — Z0000 Encounter for general adult medical examination without abnormal findings: Secondary | ICD-10-CM | POA: Diagnosis not present

## 2019-03-11 ENCOUNTER — Ambulatory Visit (INDEPENDENT_AMBULATORY_CARE_PROVIDER_SITE_OTHER): Payer: Medicare Other | Admitting: *Deleted

## 2019-03-11 DIAGNOSIS — I442 Atrioventricular block, complete: Secondary | ICD-10-CM

## 2019-03-12 LAB — CUP PACEART REMOTE DEVICE CHECK
Battery Remaining Longevity: 62 mo
Battery Remaining Percentage: 51 %
Battery Voltage: 2.87 V
Brady Statistic AP VP Percent: 1.9 %
Brady Statistic AP VS Percent: 23 %
Brady Statistic AS VP Percent: 2.3 %
Brady Statistic AS VS Percent: 72 %
Brady Statistic RA Percent Paced: 22 %
Brady Statistic RV Percent Paced: 4.7 %
Date Time Interrogation Session: 20201208031535
Implantable Lead Implant Date: 20130110
Implantable Lead Implant Date: 20130110
Implantable Lead Location: 753859
Implantable Lead Location: 753860
Implantable Pulse Generator Implant Date: 20130110
Lead Channel Impedance Value: 360 Ohm
Lead Channel Impedance Value: 440 Ohm
Lead Channel Pacing Threshold Amplitude: 0.5 V
Lead Channel Pacing Threshold Amplitude: 1.25 V
Lead Channel Pacing Threshold Pulse Width: 0.4 ms
Lead Channel Pacing Threshold Pulse Width: 0.4 ms
Lead Channel Sensing Intrinsic Amplitude: 12 mV
Lead Channel Sensing Intrinsic Amplitude: 2.6 mV
Lead Channel Setting Pacing Amplitude: 2 V
Lead Channel Setting Pacing Amplitude: 2.5 V
Lead Channel Setting Pacing Pulse Width: 0.4 ms
Lead Channel Setting Sensing Sensitivity: 4.5 mV
Pulse Gen Model: 2210
Pulse Gen Serial Number: 7303108

## 2019-05-14 DIAGNOSIS — F4321 Adjustment disorder with depressed mood: Secondary | ICD-10-CM | POA: Diagnosis not present

## 2019-05-17 ENCOUNTER — Ambulatory Visit: Payer: Medicare Other | Attending: Internal Medicine

## 2019-05-17 DIAGNOSIS — Z23 Encounter for immunization: Secondary | ICD-10-CM | POA: Insufficient documentation

## 2019-05-17 NOTE — Progress Notes (Signed)
   Covid-19 Vaccination Clinic  Name:  Joy Newman    MRN: [id number] DOB: December 16, 1927  05/17/2019  [name] was observed post Covid-19 immunization for 15 minutes without incidence. She was provided with Vaccine Information Sheet and instruction to access the V-Safe system.   [name] was instructed to call 911 with any severe reactions post vaccine: Marland Kitchen Difficulty breathing  . Swelling of your face and throat  . A fast heartbeat  . A bad rash all over your body  . Dizziness and weakness    Immunizations Administered    Name Date Dose VIS Date Route   Pfizer COVID-19 Vaccine 05/17/2019  8:40 AM 0.3 mL 03/14/2019 Intramuscular   Manufacturer: [place]   Lot: [id number]   [place]: [id number]

## 2019-06-04 DIAGNOSIS — F4321 Adjustment disorder with depressed mood: Secondary | ICD-10-CM | POA: Diagnosis not present

## 2019-06-09 ENCOUNTER — Ambulatory Visit: Payer: Medicare Other | Attending: Internal Medicine

## 2019-06-09 DIAGNOSIS — Z23 Encounter for immunization: Secondary | ICD-10-CM | POA: Insufficient documentation

## 2019-06-09 NOTE — Progress Notes (Signed)
   Covid-19 Vaccination Clinic  Name:  Joy Newman    MRN: [id number] DOB: [birth year]  06/09/2019  Joy Newman was observed post Covid-19 immunization for 15 minutes without incident. She was provided with Vaccine Information Sheet and instruction to access the V-Safe system.   Joy Newman was instructed to call 911 with any severe reactions post vaccine: Marland Kitchen Difficulty breathing  . Swelling of face and throat  . A fast heartbeat  . A bad rash all over body  . Dizziness and weakness   Immunizations Administered    Name Date Dose VIS Date Route   Pfizer COVID-19 Vaccine 06/09/2019  8:39 AM 0.3 mL 03/14/2019 Intramuscular   Manufacturer: [place]   Lot: [id number]   [place]: [id number]

## 2019-06-10 ENCOUNTER — Ambulatory Visit (INDEPENDENT_AMBULATORY_CARE_PROVIDER_SITE_OTHER): Payer: Medicare Other | Admitting: *Deleted

## 2019-06-10 DIAGNOSIS — I442 Atrioventricular block, complete: Secondary | ICD-10-CM | POA: Diagnosis not present

## 2019-06-10 LAB — CUP PACEART REMOTE DEVICE CHECK
Battery Remaining Longevity: 62 mo
Battery Remaining Percentage: 51 %
Battery Voltage: 2.87 V
Brady Statistic AP VP Percent: 2 %
Brady Statistic AP VS Percent: 23 %
Brady Statistic AS VP Percent: 2.4 %
Brady Statistic AS VS Percent: 72 %
Brady Statistic RA Percent Paced: 22 %
Brady Statistic RV Percent Paced: 5 %
Date Time Interrogation Session: 20210309034902
Implantable Lead Implant Date: 20130110
Implantable Lead Implant Date: 20130110
Implantable Lead Location: 753859
Implantable Lead Location: 753860
Implantable Pulse Generator Implant Date: 20130110
Lead Channel Impedance Value: 360 Ohm
Lead Channel Impedance Value: 430 Ohm
Lead Channel Pacing Threshold Amplitude: 0.5 V
Lead Channel Pacing Threshold Amplitude: 1.25 V
Lead Channel Pacing Threshold Pulse Width: 0.4 ms
Lead Channel Pacing Threshold Pulse Width: 0.4 ms
Lead Channel Sensing Intrinsic Amplitude: 12 mV
Lead Channel Sensing Intrinsic Amplitude: 2.8 mV
Lead Channel Setting Pacing Amplitude: 2 V
Lead Channel Setting Pacing Amplitude: 2.5 V
Lead Channel Setting Pacing Pulse Width: 0.4 ms
Lead Channel Setting Sensing Sensitivity: 4.5 mV
Pulse Gen Model: 2210
Pulse Gen Serial Number: 7303108

## 2019-06-11 NOTE — Progress Notes (Signed)
PPM Remote  

## 2019-08-20 DIAGNOSIS — E559 Vitamin D deficiency, unspecified: Secondary | ICD-10-CM | POA: Diagnosis not present

## 2019-08-20 DIAGNOSIS — M810 Age-related osteoporosis without current pathological fracture: Secondary | ICD-10-CM | POA: Diagnosis not present

## 2019-08-20 DIAGNOSIS — E7800 Pure hypercholesterolemia, unspecified: Secondary | ICD-10-CM | POA: Diagnosis not present

## 2019-08-20 DIAGNOSIS — I4821 Permanent atrial fibrillation: Secondary | ICD-10-CM | POA: Diagnosis not present

## 2019-08-20 DIAGNOSIS — Z1331 Encounter for screening for depression: Secondary | ICD-10-CM | POA: Diagnosis not present

## 2019-08-20 DIAGNOSIS — N182 Chronic kidney disease, stage 2 (mild): Secondary | ICD-10-CM | POA: Diagnosis not present

## 2019-08-20 DIAGNOSIS — D6869 Other thrombophilia: Secondary | ICD-10-CM | POA: Diagnosis not present

## 2019-08-20 DIAGNOSIS — Z950 Presence of cardiac pacemaker: Secondary | ICD-10-CM | POA: Diagnosis not present

## 2019-08-20 DIAGNOSIS — Z7901 Long term (current) use of anticoagulants: Secondary | ICD-10-CM | POA: Diagnosis not present

## 2019-08-20 DIAGNOSIS — I951 Orthostatic hypotension: Secondary | ICD-10-CM | POA: Diagnosis not present

## 2019-08-20 DIAGNOSIS — F4321 Adjustment disorder with depressed mood: Secondary | ICD-10-CM | POA: Diagnosis not present

## 2019-08-20 DIAGNOSIS — L814 Other melanin hyperpigmentation: Secondary | ICD-10-CM | POA: Diagnosis not present

## 2019-09-09 ENCOUNTER — Ambulatory Visit (INDEPENDENT_AMBULATORY_CARE_PROVIDER_SITE_OTHER): Payer: Medicare Other | Admitting: *Deleted

## 2019-09-09 DIAGNOSIS — I442 Atrioventricular block, complete: Secondary | ICD-10-CM | POA: Diagnosis not present

## 2019-09-09 LAB — CUP PACEART REMOTE DEVICE CHECK
Battery Remaining Longevity: 63 mo
Battery Remaining Percentage: 51 %
Battery Voltage: 2.87 V
Brady Statistic AP VP Percent: 2 %
Brady Statistic AP VS Percent: 22 %
Brady Statistic AS VP Percent: 2.3 %
Brady Statistic AS VS Percent: 72 %
Brady Statistic RA Percent Paced: 21 %
Brady Statistic RV Percent Paced: 4.8 %
Date Time Interrogation Session: 20210608032103
Implantable Lead Implant Date: 20130110
Implantable Lead Implant Date: 20130110
Implantable Lead Location: 753859
Implantable Lead Location: 753860
Implantable Pulse Generator Implant Date: 20130110
Lead Channel Impedance Value: 390 Ohm
Lead Channel Impedance Value: 440 Ohm
Lead Channel Pacing Threshold Amplitude: 0.5 V
Lead Channel Pacing Threshold Amplitude: 1.25 V
Lead Channel Pacing Threshold Pulse Width: 0.4 ms
Lead Channel Pacing Threshold Pulse Width: 0.4 ms
Lead Channel Sensing Intrinsic Amplitude: 12 mV
Lead Channel Sensing Intrinsic Amplitude: 3.3 mV
Lead Channel Setting Pacing Amplitude: 2 V
Lead Channel Setting Pacing Amplitude: 2.5 V
Lead Channel Setting Pacing Pulse Width: 0.4 ms
Lead Channel Setting Sensing Sensitivity: 4.5 mV
Pulse Gen Model: 2210
Pulse Gen Serial Number: 7303108

## 2019-09-10 NOTE — Progress Notes (Signed)
Remote pacemaker transmission.   

## 2019-11-03 DIAGNOSIS — H524 Presbyopia: Secondary | ICD-10-CM | POA: Diagnosis not present

## 2019-11-03 DIAGNOSIS — H353131 Nonexudative age-related macular degeneration, bilateral, early dry stage: Secondary | ICD-10-CM | POA: Diagnosis not present

## 2019-11-03 DIAGNOSIS — Z961 Presence of intraocular lens: Secondary | ICD-10-CM | POA: Diagnosis not present

## 2019-11-26 ENCOUNTER — Ambulatory Visit (INDEPENDENT_AMBULATORY_CARE_PROVIDER_SITE_OTHER): Payer: Medicare Other | Admitting: Internal Medicine

## 2019-11-26 ENCOUNTER — Encounter: Payer: Self-pay | Admitting: Internal Medicine

## 2019-11-26 ENCOUNTER — Other Ambulatory Visit: Payer: Self-pay

## 2019-11-26 VITALS — BP 144/68 | HR 85 | Ht 64.0 in | Wt 107.4 lb

## 2019-11-26 DIAGNOSIS — I480 Paroxysmal atrial fibrillation: Secondary | ICD-10-CM | POA: Diagnosis not present

## 2019-11-26 DIAGNOSIS — I442 Atrioventricular block, complete: Secondary | ICD-10-CM

## 2019-11-26 DIAGNOSIS — Z950 Presence of cardiac pacemaker: Secondary | ICD-10-CM | POA: Diagnosis not present

## 2019-11-26 DIAGNOSIS — R55 Syncope and collapse: Secondary | ICD-10-CM

## 2019-11-26 LAB — CUP PACEART INCLINIC DEVICE CHECK
Battery Remaining Longevity: 61 mo
Battery Voltage: 2.86 V
Brady Statistic RA Percent Paced: 20 %
Brady Statistic RV Percent Paced: 4.6 %
Date Time Interrogation Session: 20210825102044
Implantable Lead Implant Date: 20130110
Implantable Lead Implant Date: 20130110
Implantable Lead Location: 753859
Implantable Lead Location: 753860
Implantable Pulse Generator Implant Date: 20130110
Lead Channel Impedance Value: 362.5 Ohm
Lead Channel Impedance Value: 412.5 Ohm
Lead Channel Pacing Threshold Amplitude: 0.5 V
Lead Channel Pacing Threshold Amplitude: 1 V
Lead Channel Pacing Threshold Pulse Width: 0.4 ms
Lead Channel Pacing Threshold Pulse Width: 0.4 ms
Lead Channel Sensing Intrinsic Amplitude: 12 mV
Lead Channel Sensing Intrinsic Amplitude: 2.3 mV
Lead Channel Setting Pacing Amplitude: 2 V
Lead Channel Setting Pacing Amplitude: 2.5 V
Lead Channel Setting Pacing Pulse Width: 0.4 ms
Lead Channel Setting Sensing Sensitivity: 4.5 mV
Pulse Gen Model: 2210
Pulse Gen Serial Number: 7303108

## 2019-11-26 NOTE — Patient Instructions (Signed)

## 2019-11-26 NOTE — Progress Notes (Signed)
Patient Care Team: Tisovec, Fransico Him, MD as PCP - General (Internal Medicine)   HPI  Joy Newman is a 86 y.o. female Seen in followup for pacemaker implantation January 13 for syncope and intermittent complete heart block. She had carotid sinus hypersensitivity and antecedent PR prolongation suggestive of a neurally mediated trigger. Remote history of myocardial infarction and ischemic heart disease.   Echo 2013 demonstrated normal left ventricular function   She told me about her support of the [facility] in [place] received a reward for her effort; htat is now complete  The patient denies chest pain, shortness of breath, nocturnal dyspnea, orthopnea or peripheral edema.  There have been no palpitations, lightheadedness or syncope.    Walking mostly every day.  Moderately isolation secondary to Covid.  Past Medical History:  Diagnosis Date  . Arteriovenous malformation of colon   . Basal cell carcinoma   . CAD (coronary artery disease) no obstruction at cath    Hx of with an anteroapical M.I  . Carotid sinus hypersensitivity   . Complete heart block-intermittent   . Diastolic dysfunction   . Diverticulosis   . Fall 05/2018  . Heart disease   . Hyperlipidemia   . Hypertension   . Kidney stones   . Melanoma ([place])   . MVP (mitral valve prolapse)   . Myocardial infarction ([place])    2005  . Orthostasis   . Pacemaker    St Jude  . Pulmonary hypertension (HCC)     Hx of Mild  . PVC and PACs   . Squamous carcinoma   . Syncope   . Tricuspid regurgitation    Moderate regurgitation  . Tubular adenoma of colon 02/2012    Past Surgical History:  Procedure Laterality Date  . ABDOMINAL HYSTERECTOMY  1980   Hx of  . APPENDECTOMY  1954   Hx of  . CARDIAC CATHETERIZATION     Revealed an apical wall motion abnormality  . CARDIOVASCULAR STRESS TEST  02-21-2006   EF 84%, which revealed an apical defect  . CATARACT EXTRACTION Bilateral    . NASAL RECONSTRUCTION  1981  . PACEMAKER INSERTION    . PERMANENT PACEMAKER INSERTION N/A 04/13/2011   Procedure: PERMANENT PACEMAKER INSERTION;  Surgeon: Evans Lance, MD;  Location: [facility] CATH LAB;  Service: Cardiovascular;  Laterality: N/A;  . SKIN CANCER EXCISION     several, basal cell, squamous, melanoma  . TONSILLECTOMY  1971   Hx of    Current Outpatient Medications  Medication Sig Dispense Refill  . apixaban (ELIQUIS) 2.5 MG TABS tablet Take 1 tablet (2.5 mg total) by mouth 2 (two) times daily. Re-tart this medicine on 11/27/14 60 tablet 11  . ascorbic acid (VITAMIN C) 1000 MG tablet Take 1 tablet by mouth daily.    Marland Kitchen buPROPion (WELLBUTRIN) 75 MG tablet Take 75 mg by mouth. 1/2 tab in the am and 1/2 tab in the pm.    . Calcium Carb-Cholecalciferol (CALCIUM 600 + D PO) Take 600 mg by mouth 2 (two) times daily.    . Cholecalciferol 50 MCG (2000 UT) TABS Take 1 tablet by mouth daily.    Marland Kitchen diltiazem (CARDIZEM CD) 180 MG 24 hr capsule Take 1 capsule (180 mg total) by mouth every morning.    . Multiple Vitamin (MULTIVITAMIN WITH MINERALS) TABS tablet Take 1 tablet by mouth daily.    . pantoprazole (PROTONIX) 40 MG tablet Take 40 mg by mouth daily.     Marland Kitchen  rosuvastatin (CRESTOR) 5 MG tablet Take 5 mg by mouth daily.     No current facility-administered medications for this visit.    Allergies  Allergen Reactions  . Imdur [Isosorbide Mononitrate]     Intolerant and cause headaches and nausea  . Isosorbide Nitrate Other (See Comments) and Nausea Only  . Polysporin [Bacitracin-Polymyxin B]     Rash  . Quinine Derivatives     Increase in Heart Rate and Decrease in BP   . Tetracyclines & Related     Rash    Review of Systems negative except from HPI and PMH  Physical Exam BP (!) 144/68   Pulse 85   Ht 5\' 4"  (1.626 m)   Wt 107 lb 6.4 oz (48.7 kg)   SpO2 95%   BMI 18.44 kg/m  Well developed and well nourished in no acute distress HENT normal Neck supple with  JVP-flat Clear Device pocket well healed; without hematoma or erythema.  There is no tethering  Regular rate and rhythm, no   murmur Abd-soft with active BS No Clubbing cyanosis   edema Skin-warm and dry A & Oriented  Grossly normal sensory and motor function  ECG sinus at 85 Interval 43/07/36 PACs-frequent   Assessment and  Plan  Atrial fibrillation-paroxysmal  Atrial tach -nonsustained   Pacemaker-St. Jude The patient's device was interrogated.  The information was reviewed.  Device was reprogrammed to have atrial sensing exclude the ring electrode  Syncope-carotid sinus hypersensitivity  Ischemic heart disease-apical wall motion abnormality 2004 but without significant obstruction  Intermittent complete heart block  Nose reversion-atrial/ventricular lead with false afib detection ( ring electrode)    On Anticoagulation;  No bleeding issues   No intercurrent atrial fibrillation or flutter device detected episodes were false positive generated by noise   Without symptoms of ischemia .

## 2019-12-09 ENCOUNTER — Ambulatory Visit (INDEPENDENT_AMBULATORY_CARE_PROVIDER_SITE_OTHER): Payer: Medicare Other | Admitting: *Deleted

## 2019-12-09 DIAGNOSIS — I442 Atrioventricular block, complete: Secondary | ICD-10-CM

## 2019-12-09 LAB — CUP PACEART REMOTE DEVICE CHECK
Battery Remaining Longevity: 56 mo
Battery Remaining Percentage: 46 %
Battery Voltage: 2.86 V
Brady Statistic AP VP Percent: 2 %
Brady Statistic AP VS Percent: 16 %
Brady Statistic AS VP Percent: 2.9 %
Brady Statistic AS VS Percent: 78 %
Brady Statistic RA Percent Paced: 15 %
Brady Statistic RV Percent Paced: 5 %
Date Time Interrogation Session: 20210907035213
Implantable Lead Implant Date: 20130110
Implantable Lead Implant Date: 20130110
Implantable Lead Location: 753859
Implantable Lead Location: 753860
Implantable Pulse Generator Implant Date: 20130110
Lead Channel Impedance Value: 360 Ohm
Lead Channel Impedance Value: 430 Ohm
Lead Channel Pacing Threshold Amplitude: 0.5 V
Lead Channel Pacing Threshold Amplitude: 1 V
Lead Channel Pacing Threshold Pulse Width: 0.4 ms
Lead Channel Pacing Threshold Pulse Width: 0.4 ms
Lead Channel Sensing Intrinsic Amplitude: 12 mV
Lead Channel Sensing Intrinsic Amplitude: 2.7 mV
Lead Channel Setting Pacing Amplitude: 2 V
Lead Channel Setting Pacing Amplitude: 2.5 V
Lead Channel Setting Pacing Pulse Width: 0.4 ms
Lead Channel Setting Sensing Sensitivity: 4.5 mV
Pulse Gen Model: 2210
Pulse Gen Serial Number: 7303108

## 2019-12-10 NOTE — Progress Notes (Signed)
Remote pacemaker transmission.   

## 2020-02-12 DIAGNOSIS — E559 Vitamin D deficiency, unspecified: Secondary | ICD-10-CM | POA: Diagnosis not present

## 2020-02-12 DIAGNOSIS — E7800 Pure hypercholesterolemia, unspecified: Secondary | ICD-10-CM | POA: Diagnosis not present

## 2020-02-19 DIAGNOSIS — I341 Nonrheumatic mitral (valve) prolapse: Secondary | ICD-10-CM | POA: Diagnosis not present

## 2020-02-19 DIAGNOSIS — Z0000 Encounter for general adult medical examination without abnormal findings: Secondary | ICD-10-CM | POA: Diagnosis not present

## 2020-02-19 DIAGNOSIS — I951 Orthostatic hypotension: Secondary | ICD-10-CM | POA: Diagnosis not present

## 2020-02-19 DIAGNOSIS — I1310 Hypertensive heart and chronic kidney disease without heart failure, with stage 1 through stage 4 chronic kidney disease, or unspecified chronic kidney disease: Secondary | ICD-10-CM | POA: Diagnosis not present

## 2020-02-19 DIAGNOSIS — I700 Atherosclerosis of aorta: Secondary | ICD-10-CM | POA: Diagnosis not present

## 2020-02-19 DIAGNOSIS — E7800 Pure hypercholesterolemia, unspecified: Secondary | ICD-10-CM | POA: Diagnosis not present

## 2020-02-19 DIAGNOSIS — K2950 Unspecified chronic gastritis without bleeding: Secondary | ICD-10-CM | POA: Diagnosis not present

## 2020-02-19 DIAGNOSIS — R413 Other amnesia: Secondary | ICD-10-CM | POA: Diagnosis not present

## 2020-02-19 DIAGNOSIS — R82998 Other abnormal findings in urine: Secondary | ICD-10-CM | POA: Diagnosis not present

## 2020-02-19 DIAGNOSIS — I4821 Permanent atrial fibrillation: Secondary | ICD-10-CM | POA: Diagnosis not present

## 2020-02-19 DIAGNOSIS — N1831 Chronic kidney disease, stage 3a: Secondary | ICD-10-CM | POA: Diagnosis not present

## 2020-02-19 DIAGNOSIS — Z7901 Long term (current) use of anticoagulants: Secondary | ICD-10-CM | POA: Diagnosis not present

## 2020-02-19 DIAGNOSIS — D6869 Other thrombophilia: Secondary | ICD-10-CM | POA: Diagnosis not present

## 2020-02-19 DIAGNOSIS — I10 Essential (primary) hypertension: Secondary | ICD-10-CM | POA: Diagnosis not present

## 2020-02-23 DIAGNOSIS — Z23 Encounter for immunization: Secondary | ICD-10-CM | POA: Diagnosis not present

## 2020-02-24 ENCOUNTER — Telehealth: Payer: Self-pay | Admitting: Neurology

## 2020-02-24 NOTE — Telephone Encounter (Signed)
Patient had blood work done on 19 February 2020. Vitamin B12 level of 499, RPR nonreactive. Glucose 92, total bilirubin of of 0.6, creatinine 2.9 sodium 142, potassium 4.2, chloride of 101, CO2 27, calcium 9.8, total protein 6.8, albumin 4.3, liver profile is unremarkable, BUN of 16.  White blood count of 7.28, hemoglobin of 14.5, hematocrit 42.9, MCV of 101.0, platelets of 219

## 2020-02-25 ENCOUNTER — Ambulatory Visit (INDEPENDENT_AMBULATORY_CARE_PROVIDER_SITE_OTHER): Payer: Medicare Other | Admitting: Neurology

## 2020-02-25 ENCOUNTER — Telehealth: Payer: Self-pay | Admitting: Neurology

## 2020-02-25 ENCOUNTER — Encounter: Payer: Self-pay | Admitting: Neurology

## 2020-02-25 ENCOUNTER — Other Ambulatory Visit: Payer: Self-pay

## 2020-02-25 DIAGNOSIS — R413 Other amnesia: Secondary | ICD-10-CM | POA: Diagnosis not present

## 2020-02-25 HISTORY — DX: Other amnesia: R41.3

## 2020-02-25 NOTE — Progress Notes (Signed)
Reason for visit: Memory disturbance  Referring physician: Dr. Zenon Mayo is a 86 y.o. female  History of present illness:  Joy Newman is a 86 year old right-handed white female who was seen previously through this office with episodic syncopal events.  The patient returns at this point with a new problem.  She has noted some changes in memory over the last 6 months or so.  The patient is no longer operating a motor vehicle, she stopped driving about 2 years ago, her son who is present today indicates that she had one episode of getting lost when she was driving out of town prior to her stopping her driving.  The patient lives alone, she does her own finances, she keeps up with medications and appointments.  The patient does some cooking and has no problems with this.  She does report some short-term memory issues and occasional word finding.  The son indicates that recently they visited her family's farm, when they returned she seemed to be confused about whether she was home or not.  The patient does have some underlying anxiety issues.  She is sleeping well.  She comes to the office today for further evaluation.   Past Medical History:  Diagnosis Date  . Arteriovenous malformation of colon   . Basal cell carcinoma   . CAD (coronary artery disease) no obstruction at cath    Hx of with an anteroapical M.I  . Carotid sinus hypersensitivity   . Complete heart block-intermittent   . Diastolic dysfunction   . Diverticulosis   . Fall 05/2018  . Heart disease   . Hyperlipidemia   . Hypertension   . Kidney stones   . Melanoma ([place])   . MVP (mitral valve prolapse)   . Myocardial infarction ([place])    2005  . Orthostasis   . Pacemaker    St Jude  . Pulmonary hypertension (HCC)     Hx of Mild  . PVC and PACs   . Squamous carcinoma   . Syncope   . Tricuspid regurgitation    Moderate regurgitation  . Tubular adenoma of colon 02/2012    Past Surgical  History:  Procedure Laterality Date  . ABDOMINAL HYSTERECTOMY  1980   Hx of  . APPENDECTOMY  1954   Hx of  . CARDIAC CATHETERIZATION     Revealed an apical wall motion abnormality  . CARDIOVASCULAR STRESS TEST  02-21-2006   EF 84%, which revealed an apical defect  . CATARACT EXTRACTION Bilateral   . NASAL RECONSTRUCTION  1981  . PACEMAKER INSERTION    . PERMANENT PACEMAKER INSERTION N/A 04/13/2011   Procedure: PERMANENT PACEMAKER INSERTION;  Surgeon: Evans Lance, MD;  Location: [facility] CATH LAB;  Service: Cardiovascular;  Laterality: N/A;  . SKIN CANCER EXCISION     several, basal cell, squamous, melanoma  . TONSILLECTOMY  1971   Hx of    Family History  Problem Relation Age of Onset  . Stroke Mother   . Kidney disease Father   . Lung cancer Brother   . Kidney disease Sister   . Stroke Brother   . Heart disease Brother        x 2  . Liver cancer Sister   . Breast cancer Neg Hx     Social history:  reports that she has never smoked. She has never used smokeless tobacco. She reports current alcohol use. She reports that she does not use drugs.  Medications:  Prior to Admission  medications   Medication Sig Start Date End Date Taking? Authorizing Provider  apixaban (ELIQUIS) 2.5 MG TABS tablet Take 1 tablet (2.5 mg total) by mouth 2 (two) times daily. Re-tart this medicine on 11/27/14 11/21/14  Yes Nita Sells, MD  buPROPion (WELLBUTRIN) 75 MG tablet Take 75 mg by mouth. 1/2 tab in the am and 1/2 tab in the pm.   Yes [provider]  Cholecalciferol 50 MCG (2000 UT) TABS Take 1 tablet by mouth daily.   Yes [provider]  diltiazem (CARDIZEM CD) 180 MG 24 hr capsule Take 1 capsule (180 mg total) by mouth every morning. 11/21/14  Yes Nita Sells, MD  Multiple Vitamin (MULTIVITAMIN WITH MINERALS) TABS tablet Take 1 tablet by mouth daily.   Yes [provider]  pantoprazole (PROTONIX) 40 MG tablet Take 40 mg by mouth daily.    Yes [provider]  rosuvastatin (CRESTOR) 5 MG tablet Take 5 mg by mouth daily.   Yes [provider]  sertraline (ZOLOFT) 25 MG tablet Take 25 mg by mouth in the morning and at bedtime.   Yes [provider]  ascorbic acid (VITAMIN C) 1000 MG tablet Take 1 tablet by mouth daily.    [provider]  Calcium Carb-Cholecalciferol (CALCIUM 600 + D PO) Take 600 mg by mouth 2 (two) times daily.    [provider]      Allergies  Allergen Reactions  . Imdur [Isosorbide Mononitrate]     Intolerant and cause headaches and nausea  . Isosorbide Nitrate Other (See Comments) and Nausea Only  . Polysporin [Bacitracin-Polymyxin B]     Rash  . Quinine Derivatives     Increase in Heart Rate and Decrease in BP   . Tetracyclines & Related     Rash    ROS:  Out of a complete 14 system review of symptoms, the patient complains only of the following symptoms, and all other reviewed systems are negative.  Memory troubles Anxiety  Blood pressure 124/65, pulse 81, height 5\' 2"  (1.575 m), weight 107 lb (48.5 kg).  Physical Exam  General: The patient is alert and cooperative at the time of the examination.  Eyes: Pupils are equal, round, and reactive to light. Discs are flat bilaterally.  Good venous pulsations are seen.  Neck: The neck is supple, no carotid bruits are noted.  Respiratory: The respiratory examination is clear.  Cardiovascular: The cardiovascular examination reveals a regular rate and rhythm, no obvious murmurs or rubs are noted.  Skin: Extremities are without significant edema.  Neurologic Exam  Mental status: The patient is alert and oriented x 3 at the time of the examination. The Mini-Mental status examination done today shows a total score 20/30.  Cranial nerves: Facial symmetry is present. There is good sensation of the face to pinprick and soft touch bilaterally. The strength of the facial muscles and the muscles to head turning and  shoulder shrug are normal bilaterally. Speech is well enunciated, no aphasia or dysarthria is noted. Extraocular movements are restricted in the vertical plane, the patient has almost no ability to superiorly deviate the eyes, minimal inferior deviation is seen.  Horizontal movements appear to be more normal. Visual fields are full. The tongue is midline, and the patient has symmetric elevation of the soft palate. No obvious hearing deficits are noted.  Motor: The motor testing reveals 5 over 5 strength of all 4 extremities. Good symmetric motor tone is noted throughout.  Sensory: Sensory testing is  intact to pinprick, soft touch, vibration sensation, and position sense on all 4 extremities, with exception that the position sense in the left foot is decreased. No evidence of extinction is noted.  Coordination: Cerebellar testing reveals good finger-nose-finger and heel-to-shin bilaterally.  Gait and station: Gait is normal. Tandem gait is minimally unsteady. Romberg is negative. No drift is seen.  Reflexes: Deep tendon reflexes are symmetric and normal bilaterally. Toes are downgoing bilaterally.   Assessment/Plan:  1.  Mild memory disturbance  The patient claims that she is no longer having episodes of syncope.  Her clinical examination today shows significant restriction of vertical eye movements, the etiology of this is not clear.  The patient be set up for CT scan of the brain.  She has already had blood work done to include a normal B12 level and negative RPR.  She does not wish to go on any medications for memory currently.  She will follow-up here in 6 months.   Jill Alexanders MD 02/25/2020 11:04 AM  [facility] [address] [place], [place] [address]  Phone [phone] Fax [phone]

## 2020-02-25 NOTE — Telephone Encounter (Signed)
medicare order sent to GI. No auth they will reach out to the patient to schedule.  

## 2020-03-03 ENCOUNTER — Ambulatory Visit
Admission: RE | Admit: 2020-03-03 | Discharge: 2020-03-03 | Disposition: A | Discharge status: Home / Self Care | Payer: Medicare Other | Source: Ambulatory Visit | Attending: Neurology | Admitting: Neurology

## 2020-03-03 ENCOUNTER — Other Ambulatory Visit: Payer: Self-pay

## 2020-03-03 DIAGNOSIS — R413 Other amnesia: Secondary | ICD-10-CM

## 2020-03-06 ENCOUNTER — Telehealth: Payer: Self-pay | Admitting: Neurology

## 2020-03-06 NOTE — Telephone Encounter (Signed)
I called about the CT of the head, this was relatively unremarkable, mild small vessel disease was seen.  No explanation for vertical gaze restriction or memory problem.   CT head 03/04/20:  IMPRESSION: Unremarkable CT scan of the head without contrast showing age-appropriate changes of chronic small vessel disease and generalized atrophy.  Compared to previous CT scan from 2016 these appear to be more progressed.

## 2020-03-08 NOTE — Telephone Encounter (Signed)
I called the son back, discussed the results of the CT.

## 2020-03-08 NOTE — Telephone Encounter (Signed)
Pt's son called, is tere anything else need to be concerned about with her up and down vision. Would like a call back

## 2020-03-08 NOTE — Telephone Encounter (Signed)
I called the son, left a message, the CT scan of the head looked fairly normal for age.

## 2020-03-08 NOTE — Telephone Encounter (Signed)
Pt's son Bufford Lope (on [place]) called mother did not understand this morning about her CT Scan. Dr. Jannifer Franklin can contact me at [phone] to discuss CT results

## 2020-03-09 ENCOUNTER — Ambulatory Visit (INDEPENDENT_AMBULATORY_CARE_PROVIDER_SITE_OTHER): Payer: Medicare Other

## 2020-03-09 DIAGNOSIS — I442 Atrioventricular block, complete: Secondary | ICD-10-CM

## 2020-03-09 LAB — CUP PACEART REMOTE DEVICE CHECK
Battery Remaining Longevity: 49 mo
Battery Remaining Percentage: 40 %
Battery Voltage: 2.84 V
Brady Statistic AP VP Percent: 1 %
Brady Statistic AP VS Percent: 19 %
Brady Statistic AS VP Percent: 1.8 %
Brady Statistic AS VS Percent: 77 %
Brady Statistic RA Percent Paced: 18 %
Brady Statistic RV Percent Paced: 2.9 %
Date Time Interrogation Session: 20211207032130
Implantable Lead Implant Date: 20130110
Implantable Lead Implant Date: 20130110
Implantable Lead Location: 753859
Implantable Lead Location: 753860
Implantable Pulse Generator Implant Date: 20130110
Lead Channel Impedance Value: 350 Ohm
Lead Channel Impedance Value: 430 Ohm
Lead Channel Pacing Threshold Amplitude: 0.5 V
Lead Channel Pacing Threshold Amplitude: 1 V
Lead Channel Pacing Threshold Pulse Width: 0.4 ms
Lead Channel Pacing Threshold Pulse Width: 0.4 ms
Lead Channel Sensing Intrinsic Amplitude: 12 mV
Lead Channel Sensing Intrinsic Amplitude: 2.8 mV
Lead Channel Setting Pacing Amplitude: 2 V
Lead Channel Setting Pacing Amplitude: 2.5 V
Lead Channel Setting Pacing Pulse Width: 0.4 ms
Lead Channel Setting Sensing Sensitivity: 4.5 mV
Pulse Gen Model: 2210
Pulse Gen Serial Number: 7303108

## 2020-03-19 NOTE — Progress Notes (Signed)
Remote pacemaker transmission.   

## 2020-03-25 ENCOUNTER — Encounter (HOSPITAL_COMMUNITY): Payer: Self-pay | Admitting: *Deleted

## 2020-03-25 ENCOUNTER — Emergency Department (HOSPITAL_COMMUNITY)
Admission: EM | Admit: 2020-03-25 | Discharge: 2020-03-25 | Disposition: A | Discharge status: Home / Self Care | Payer: Medicare Other | Attending: Emergency Medicine | Admitting: Emergency Medicine

## 2020-03-25 ENCOUNTER — Emergency Department (HOSPITAL_COMMUNITY): Payer: Medicare Other

## 2020-03-25 DIAGNOSIS — I5030 Unspecified diastolic (congestive) heart failure: Secondary | ICD-10-CM | POA: Insufficient documentation

## 2020-03-25 DIAGNOSIS — I110 Hypertensive heart disease with heart failure: Secondary | ICD-10-CM | POA: Insufficient documentation

## 2020-03-25 DIAGNOSIS — W19XXXA Unspecified fall, initial encounter: Secondary | ICD-10-CM | POA: Diagnosis not present

## 2020-03-25 DIAGNOSIS — W010XXA Fall on same level from slipping, tripping and stumbling without subsequent striking against object, initial encounter: Secondary | ICD-10-CM | POA: Insufficient documentation

## 2020-03-25 DIAGNOSIS — Z043 Encounter for examination and observation following other accident: Secondary | ICD-10-CM | POA: Diagnosis not present

## 2020-03-25 DIAGNOSIS — S00501A Unspecified superficial injury of lip, initial encounter: Secondary | ICD-10-CM | POA: Diagnosis present

## 2020-03-25 DIAGNOSIS — I2510 Atherosclerotic heart disease of native coronary artery without angina pectoris: Secondary | ICD-10-CM | POA: Insufficient documentation

## 2020-03-25 DIAGNOSIS — S01501A Unspecified open wound of lip, initial encounter: Secondary | ICD-10-CM | POA: Diagnosis not present

## 2020-03-25 DIAGNOSIS — S0990XA Unspecified injury of head, initial encounter: Secondary | ICD-10-CM | POA: Diagnosis not present

## 2020-03-25 DIAGNOSIS — Z950 Presence of cardiac pacemaker: Secondary | ICD-10-CM | POA: Diagnosis not present

## 2020-03-25 DIAGNOSIS — I4891 Unspecified atrial fibrillation: Secondary | ICD-10-CM | POA: Diagnosis not present

## 2020-03-25 DIAGNOSIS — S01511A Laceration without foreign body of lip, initial encounter: Secondary | ICD-10-CM | POA: Diagnosis not present

## 2020-03-25 DIAGNOSIS — Y9301 Activity, walking, marching and hiking: Secondary | ICD-10-CM | POA: Insufficient documentation

## 2020-03-25 DIAGNOSIS — Z7901 Long term (current) use of anticoagulants: Secondary | ICD-10-CM | POA: Insufficient documentation

## 2020-03-25 DIAGNOSIS — S0993XA Unspecified injury of face, initial encounter: Secondary | ICD-10-CM | POA: Diagnosis not present

## 2020-03-25 DIAGNOSIS — R52 Pain, unspecified: Secondary | ICD-10-CM | POA: Diagnosis not present

## 2020-03-25 MED ORDER — LIDOCAINE HCL (PF) 1 % IJ SOLN
5.0000 mL | Freq: Once | INTRAMUSCULAR | Status: AC
  Administered 2020-03-25: 5 mL
  Filled 2020-03-25: qty 30

## 2020-03-25 NOTE — ED Provider Notes (Signed)
Care assumed from Dr. Alvino Chapel at shift change.  Patient on Eliquis presenting here after a fall.  Patient had sutures placed to her lip and is awaiting results of her head CT.  The head CT has resulted and is negative for bleed or other acute intracranial process.  Patient is awake, alert, and appears clinically well.  Patient will be discharged with as needed return.   Veryl Speak, MD 03/25/20 [phone]

## 2020-03-25 NOTE — Discharge Instructions (Addendum)
Have the stitches taken out in around 5 days.  Return to the emergency department for worsening headache, visual disturbances, vomiting, or other new and concerning symptoms.

## 2020-03-25 NOTE — ED Provider Notes (Signed)
[facility] DEPT Provider Note   CSN: [id number] Arrival date & time: 03/25/20  1308     History Chief Complaint  Patient presents with  . Fall  . Facial Laceration    Lower lip    Joy Newman is a 86 y.o. female.  HPI Patient presents after a fall.  Reportedly lost her balance and fell.  Denies loss of consciousness.  No headache.  Did hit her lower lip and has a laceration.  Tetanus is up-to-date.  Does not know if she is on anticoagulation but does not think so.  States check her medicine list.  No neck pain.  No jaw pain.  No vision changes.  No chest or abdominal pain.  No extremity pain.    Past Medical History:  Diagnosis Date  . Arteriovenous malformation of colon   . Basal cell carcinoma   . CAD (coronary artery disease) no obstruction at cath    Hx of with an anteroapical M.I  . Carotid sinus hypersensitivity   . Complete heart block-intermittent   . Diastolic dysfunction   . Diverticulosis   . Fall 05/2018  . Heart disease   . Hyperlipidemia   . Hypertension   . Kidney stones   . Melanoma ([place])   . Memory difficulty 02/25/2020  . MVP (mitral valve prolapse)   . Myocardial infarction ([place])    2005  . Orthostasis   . Pacemaker    St Jude  . Pulmonary hypertension (HCC)     Hx of Mild  . PVC and PACs   . Squamous carcinoma   . Syncope   . Tricuspid regurgitation    Moderate regurgitation  . Tubular adenoma of colon 02/2012    Patient Active Problem List   Diagnosis Date Noted  . Memory difficulty 02/25/2020  . PAF (paroxysmal atrial fibrillation) ([place]) 05/14/2018  . CAD (coronary artery disease) 05/14/2018  . Injury of face   . Subdural hematoma ([place]) 11/20/2014  . Syncope and collapse 11/20/2014  . Basal cell carcinoma 11/20/2014  . Complicated laceration of lip 11/20/2014  . Noise reversion on the ventricular lead 05/14/2012  . Pacemaker-St Judes   . Atrioventricular block, complete ([place]) 04/12/2011   . Carotid sinus hypersensitivity   . Hypertension   . PVC and PACs   . Syncope 04/11/2011  . Hyperlipidemia   . Diastolic dysfunction   . Mitral valve prolapse   . Tricuspid regurgitation   . History of Arteriovenous malformation of colon     Past Surgical History:  Procedure Laterality Date  . ABDOMINAL HYSTERECTOMY  1980   Hx of  . APPENDECTOMY  1954   Hx of  . CARDIAC CATHETERIZATION     Revealed an apical wall motion abnormality  . CARDIOVASCULAR STRESS TEST  02-21-2006   EF 84%, which revealed an apical defect  . CATARACT EXTRACTION Bilateral   . NASAL RECONSTRUCTION  1981  . PACEMAKER INSERTION    . PERMANENT PACEMAKER INSERTION N/A 04/13/2011   Procedure: PERMANENT PACEMAKER INSERTION;  Surgeon: Evans Lance, MD;  Location: [facility] CATH LAB;  Service: Cardiovascular;  Laterality: N/A;  . SKIN CANCER EXCISION     several, basal cell, squamous, melanoma  . TONSILLECTOMY  1971   Hx of     OB History   No obstetric history on file.     Family History  Problem Relation Age of Onset  . Stroke Mother   . Kidney disease Father   . Lung cancer Brother   .  Kidney disease Sister   . Stroke Brother   . Heart disease Brother        x 2  . Liver cancer Sister   . Breast cancer Neg Hx     Social History   Tobacco Use  . Smoking status: Never Smoker  . Smokeless tobacco: Never Used  Vaping Use  . Vaping Use: Never used  Substance Use Topics  . Alcohol use: Yes    Comment: Rarely Wine with dinner   . Drug use: No    Home Medications Prior to Admission medications   Medication Sig Start Date End Date Taking? Authorizing Provider  apixaban (ELIQUIS) 2.5 MG TABS tablet Take 1 tablet (2.5 mg total) by mouth 2 (two) times daily. Re-tart this medicine on 11/27/14 11/21/14   Nita Sells, MD  buPROPion (WELLBUTRIN) 75 MG tablet Take 75 mg by mouth. 1/2 tab in the am and 1/2 tab in the pm.    [provider]  Cholecalciferol 50 MCG (2000 UT) TABS Take 1  tablet by mouth daily.    [provider]  diltiazem (CARDIZEM CD) 180 MG 24 hr capsule Take 1 capsule (180 mg total) by mouth every morning. 11/21/14   Nita Sells, MD  Multiple Vitamin (MULTIVITAMIN WITH MINERALS) TABS tablet Take 1 tablet by mouth daily.    [provider]  pantoprazole (PROTONIX) 40 MG tablet Take 40 mg by mouth daily.     [provider]  rosuvastatin (CRESTOR) 5 MG tablet Take 5 mg by mouth daily.    [provider]  sertraline (ZOLOFT) 25 MG tablet Take 25 mg by mouth in the morning and at bedtime.    [provider]    Allergies    Imdur [isosorbide mononitrate], Isosorbide nitrate, Polysporin [bacitracin-polymyxin b], Quinine derivatives, and Tetracyclines & related  Review of Systems   Review of Systems  Constitutional: Negative for appetite change.  HENT: Negative for congestion.   Respiratory: Negative for shortness of breath.   Gastrointestinal: Negative for abdominal pain.  Genitourinary: Negative for flank pain.  Musculoskeletal: Negative for back pain.  Skin: Positive for wound.  Neurological: Negative for weakness and light-headedness.  Psychiatric/Behavioral: Negative for confusion.    Physical Exam Updated Vital Signs BP (!) 163/64 (BP Location: Left Arm)   Pulse 78   Temp 97.6 F (36.4 C) (Oral)   Resp 16   SpO2 96%   Physical Exam Vitals and nursing note reviewed.  HENT:     Mouth/Throat:     Comments: Approximate 1.5 cm L-shaped laceration on left lower lip mucosal aspect.  Also on skin side has a separate proximally 42mm laceration.  No tenderness over teeth.  Jaw stable. Eyes:     Pupils: Pupils are equal, round, and reactive to light.  Cardiovascular:     Rate and Rhythm: Regular rhythm.  Pulmonary:     Breath sounds: No wheezing or rhonchi.  Abdominal:     Tenderness: There is no abdominal tenderness.  Musculoskeletal:        General: No tenderness.     Cervical back: Neck  supple.  Skin:    General: Skin is warm.     Capillary Refill: Capillary refill takes less than 2 seconds.  Neurological:     Mental Status: She is alert and oriented to person, place, and time.     ED Results / Procedures / Treatments   Labs (all labs ordered are listed, but only abnormal results are displayed) Labs Reviewed -  No data to display  EKG None  Radiology No results found.  Procedures .Marland KitchenLaceration Repair  Date/Time: 03/25/2020 4:23 PM Performed by: Davonna Belling, MD Authorized by: Davonna Belling, MD   Consent:    Consent obtained:  Verbal   Consent given by:  Patient   Risks, benefits, and alternatives were discussed: yes     Risks discussed:  Pain, infection, need for additional repair, poor cosmetic result, poor wound healing and nerve damage   Alternatives discussed:  No treatment and delayed treatment Universal protocol:    Procedure explained and questions answered to patient or proxy's satisfaction: yes     Patient identity confirmed:  Verbally with patient and arm band Anesthesia:    Anesthesia method:  Local infiltration   Local anesthetic:  Lidocaine 1% w/o epi Laceration details:    Location:  Lip   Lip location: Lower external left and internal lower lip.   Length (cm):  2 Pre-procedure details:    Preparation:  Patient was prepped and draped in usual sterile fashion Exploration:    Hemostasis achieved with:  Direct pressure   Wound exploration: wound explored through full range of motion and entire depth of wound visualized   Treatment:    Wound cleansed with: Alcohol.   Debridement:  None Skin repair:    Repair method:  Sutures   Suture size:  5-0   Wound skin closure material used: Vicryl rapide.   Number of sutures:  2 Approximation:    Approximation:  Close Repair type:    Repair type:  Simple Post-procedure details:    Dressing:  Open (no dressing)   Procedure completion:  Tolerated well, no immediate complications    (including critical care time)  Medications Ordered in ED Medications  lidocaine (PF) (XYLOCAINE) 1 % injection 5 mL (5 mLs Infiltration Given by Other 03/25/20 1518)    ED Course  I have reviewed the triage vital signs and the nursing notes.  Pertinent labs & imaging results that were available during my care of the patient were reviewed by me and considered in my medical decision making (see chart for details).    MDM Rules/Calculators/A&P                          Patient with what sounds to be mechanical fall.  Fell hitting her lip.  Has laceration both on the skin side and mucosal side.  Both wounds closed with absorbable sutures.  Can be removed in around 5 days although potentially the mucosal side could stay in.  Patient appears to be on anticoagulation.  Will get head CT because of this.  Cervical spine nontender do not think we need imaging of it.  Face seen.  Stable.  No other apparent injury.  Discharge home after scan if no bleeding.  Care turned over to Dr. Stark Jock Final Clinical Impression(s) / ED Diagnoses Final diagnoses:  Fall, initial encounter  Lip laceration, initial encounter    Rx / DC Orders ED Discharge Orders    None       Davonna Belling, MD 03/25/20 1659

## 2020-03-25 NOTE — ED Triage Notes (Signed)
Per EMS, pt tripped and fell while walking this morning. She has laceration to lower lip. No other complaints. No neck/back pain. No LOC.   BP 154/70 HR 80 RR 18 O2 99%

## 2020-03-31 ENCOUNTER — Telehealth: Payer: Self-pay | Admitting: Neurology

## 2020-03-31 NOTE — Telephone Encounter (Signed)
Pt's son called with mother on the line stating that the pt has decided to go ahead and try the medication that was mentioned to her during her appt to help with her memory loss. Please advise.

## 2020-04-01 NOTE — Telephone Encounter (Signed)
LVM (on DPR) for son informing him Dr. Anne Hahn will return Monday and prescribe memory medicine.

## 2020-04-02 MED ORDER — DONEPEZIL HCL 5 MG PO TABS: 5.0000 mg | ORAL_TABLET | Freq: Every day | ORAL | 1 refills | Status: DC

## 2020-04-02 NOTE — Telephone Encounter (Signed)
I called the son, left a message that I will call in a prescription for the Aricept.

## 2020-04-02 NOTE — Addendum Note (Signed)
Addended by: York Spaniel on: 04/02/2020 01:01 PM   Modules accepted: Orders

## 2020-04-29 ENCOUNTER — Telehealth: Payer: Self-pay | Admitting: Neurology

## 2020-04-29 NOTE — Telephone Encounter (Signed)
Pt.'s friend Jenny Reichmann states he & the family are noticing a change in pt.'s condition. He states in the evenings, she is more agitated & irritable from taking donepezil (ARICEPT) 5 MG tablet. He asks can the medication be altered or changed completely. Please advise.

## 2020-04-29 NOTE — Telephone Encounter (Signed)
I called the son.  The patient is having paradoxical irritability and agitation off of the Aricept with vivid dreams.  They are to stop the medication, if desired I would be happy to call in a prescription for Namenda.  They are to contact me if they want me to call in a prescription.

## 2020-04-29 NOTE — Telephone Encounter (Signed)
Called Joy Newman back and he said that she more irritable after taking the Aricept and she is having more vivid dreams.  He said this is all new since taking the medicine.  Aware Dr. Jannifer Franklin is out of office til Monday.

## 2020-04-30 NOTE — Telephone Encounter (Signed)
Pt's son returned the call and stated that they will be stopping the donepezil (ARICEPT) 5 MG tablet and would like the Namenda called in for the pt. Please advise.

## 2020-05-01 MED ORDER — MEMANTINE HCL 5 MG PO TABS: ORAL_TABLET | ORAL | 0 refills | Status: DC

## 2020-05-01 NOTE — Addendum Note (Signed)
Addended by: Kathrynn Ducking on: 05/01/2020 03:50 PM   Modules accepted: Orders

## 2020-05-01 NOTE — Telephone Encounter (Signed)
A prescription for the Namenda was sent in. 

## 2020-06-08 ENCOUNTER — Ambulatory Visit (INDEPENDENT_AMBULATORY_CARE_PROVIDER_SITE_OTHER): Payer: Medicare Other

## 2020-06-08 DIAGNOSIS — I442 Atrioventricular block, complete: Secondary | ICD-10-CM

## 2020-06-10 LAB — CUP PACEART REMOTE DEVICE CHECK
Battery Remaining Longevity: 43 mo
Battery Remaining Percentage: 35 %
Battery Voltage: 2.83 V
Brady Statistic AP VP Percent: 1 %
Brady Statistic AP VS Percent: 17 %
Brady Statistic AS VP Percent: 1.4 %
Brady Statistic AS VS Percent: 80 %
Brady Statistic RA Percent Paced: 16 %
Brady Statistic RV Percent Paced: 2.3 %
Date Time Interrogation Session: 20220309112944
Implantable Lead Implant Date: 20130110
Implantable Lead Implant Date: 20130110
Implantable Lead Location: 753859
Implantable Lead Location: 753860
Implantable Pulse Generator Implant Date: 20130110
Lead Channel Impedance Value: 340 Ohm
Lead Channel Impedance Value: 430 Ohm
Lead Channel Pacing Threshold Amplitude: 0.5 V
Lead Channel Pacing Threshold Amplitude: 1 V
Lead Channel Pacing Threshold Pulse Width: 0.4 ms
Lead Channel Pacing Threshold Pulse Width: 0.4 ms
Lead Channel Sensing Intrinsic Amplitude: 12 mV
Lead Channel Sensing Intrinsic Amplitude: 2.6 mV
Lead Channel Setting Pacing Amplitude: 2 V
Lead Channel Setting Pacing Amplitude: 2.5 V
Lead Channel Setting Pacing Pulse Width: 0.4 ms
Lead Channel Setting Sensing Sensitivity: 4.5 mV
Pulse Gen Model: 2210
Pulse Gen Serial Number: 7303108

## 2020-06-16 NOTE — Progress Notes (Signed)
Remote pacemaker transmission.   

## 2020-06-17 DIAGNOSIS — F39 Unspecified mood [affective] disorder: Secondary | ICD-10-CM | POA: Diagnosis not present

## 2020-06-17 DIAGNOSIS — M810 Age-related osteoporosis without current pathological fracture: Secondary | ICD-10-CM | POA: Diagnosis not present

## 2020-06-17 DIAGNOSIS — Z79899 Other long term (current) drug therapy: Secondary | ICD-10-CM | POA: Diagnosis not present

## 2020-06-17 DIAGNOSIS — E7800 Pure hypercholesterolemia, unspecified: Secondary | ICD-10-CM | POA: Diagnosis not present

## 2020-06-17 DIAGNOSIS — I4821 Permanent atrial fibrillation: Secondary | ICD-10-CM | POA: Diagnosis not present

## 2020-06-17 DIAGNOSIS — I1310 Hypertensive heart and chronic kidney disease without heart failure, with stage 1 through stage 4 chronic kidney disease, or unspecified chronic kidney disease: Secondary | ICD-10-CM | POA: Diagnosis not present

## 2020-06-17 DIAGNOSIS — N1831 Chronic kidney disease, stage 3a: Secondary | ICD-10-CM | POA: Diagnosis not present

## 2020-08-24 ENCOUNTER — Ambulatory Visit (INDEPENDENT_AMBULATORY_CARE_PROVIDER_SITE_OTHER): Payer: Medicare Other | Admitting: Neurology

## 2020-08-24 ENCOUNTER — Encounter: Payer: Self-pay | Admitting: Neurology

## 2020-08-24 VITALS — BP 150/63 | HR 75 | Ht 62.0 in | Wt 107.0 lb

## 2020-08-24 DIAGNOSIS — R413 Other amnesia: Secondary | ICD-10-CM | POA: Diagnosis not present

## 2020-08-24 NOTE — Progress Notes (Signed)
I have read the note, and I agree with the clinical assessment and plan.  Reginold Beale K Dezyre Hoefer   

## 2020-08-24 NOTE — Progress Notes (Signed)
PATIENT: Joy Newman DOB: 1928/03/06  REASON FOR VISIT: follow up HISTORY FROM: patient  HISTORY OF PRESENT ILLNESS: Today 08/24/20 Joy Newman is a 87 year old female with history of episodic syncopal events and memory issues. Dr. Jannifer Franklin has noticed vertical gaze restriction with unknown etiology.  CT head was relatively unremarkable, mild small vessel disease was seen.  Could not tolerate Aricept due to irritability and agitation. Namenda resulted in agitation as well, restless. She is living alone still, doing her cleaning, household. Is walking outside, daily. Doesn't drive, gave her car up. Eating well. Has an aide 5 days a week for several hours, trying to get 7 days a week, help with cooking, provide supervision. Here today with her granddaughter. Does well with long term memory, short term is problematic. Pills are in timed pill box, aide monitors medication. 2 falls since last seen, lose balance, or shuffle feet. No recent syncopal episodes. Enjoys walking, reading. MMSE 16/30. No major health issues since last seen. Sleeping well. Uses cane when she walks.   HISTORY  02/25/2020 Dr. Jannifer Franklin: Joy Newman is a 87 year old right-handed white female who was seen previously through this office with episodic syncopal events.  The patient returns at this point with a new problem.  She has noted some changes in memory over the last 6 months or so.  The patient is no longer operating a motor vehicle, she stopped driving about 2 years ago, her son who is present today indicates that she had one episode of getting lost when she was driving out of town prior to her stopping her driving.  The patient lives alone, she does her own finances, she keeps up with medications and appointments.  The patient does some cooking and has no problems with this.  She does report some short-term memory issues and occasional word finding.  The son indicates that recently they visited her family's  farm, when they returned she seemed to be confused about whether she was home or not.  The patient does have some underlying anxiety issues.  She is sleeping well.  She comes to the office today for further evaluation.  REVIEW OF SYSTEMS: Out of a complete 14 system review of symptoms, the patient complains only of the following symptoms, and all other reviewed systems are negative.  See HPI  ALLERGIES: Allergies  Allergen Reactions  . Aricept [Donepezil]     Irritability, vivid dreams  . Imdur [Isosorbide Mononitrate]     Intolerant and cause headaches and nausea  . Isosorbide Nitrate Other (See Comments) and Nausea Only  . Polysporin [Bacitracin-Polymyxin B]     Rash  . Quinine Derivatives     Increase in Heart Rate and Decrease in BP   . Tetracyclines & Related     Rash    HOME MEDICATIONS: Outpatient Medications Prior to Visit  Medication Sig Dispense Refill  . apixaban (ELIQUIS) 2.5 MG TABS tablet Take 1 tablet (2.5 mg total) by mouth 2 (two) times daily. Re-tart this medicine on 11/27/14 60 tablet 11  . buPROPion (WELLBUTRIN) 75 MG tablet Take 75 mg by mouth. 1/2 tab in the am and 1/2 tab in the pm.    . Cholecalciferol 50 MCG (2000 UT) TABS Take 1 tablet by mouth daily.    Marland Kitchen diltiazem (CARDIZEM CD) 180 MG 24 hr capsule Take 1 capsule (180 mg total) by mouth every morning.    . donepezil (ARICEPT) 5 MG tablet Take 1 tablet (5 mg total) by mouth at bedtime. [place]  tablet 1  . memantine (NAMENDA) 5 MG tablet Take 1 tablet daily for one week, then take 1 tablet twice daily for one week, then take 1 tablet in the morning and 2 in the evening for one week, then take 2 tablets twice daily 70 tablet 0  . Multiple Vitamin (MULTIVITAMIN WITH MINERALS) TABS tablet Take 1 tablet by mouth daily.    . pantoprazole (PROTONIX) 40 MG tablet Take 40 mg by mouth daily.     . rosuvastatin (CRESTOR) 5 MG tablet Take 5 mg by mouth daily.    . sertraline (ZOLOFT) 25 MG tablet Take 25 mg by mouth in the  morning and at bedtime.     No facility-administered medications prior to visit.    PAST MEDICAL HISTORY: Past Medical History:  Diagnosis Date  . Arteriovenous malformation of colon   . Basal cell carcinoma   . CAD (coronary artery disease) no obstruction at cath    Hx of with an anteroapical M.I  . Carotid sinus hypersensitivity   . Complete heart block-intermittent   . Diastolic dysfunction   . Diverticulosis   . Fall 05/2018  . Heart disease   . Hyperlipidemia   . Hypertension   . Kidney stones   . Melanoma ([place])   . Memory difficulty 02/25/2020  . MVP (mitral valve prolapse)   . Myocardial infarction ([place])    2005  . Orthostasis   . Pacemaker    St Jude  . Pulmonary hypertension (HCC)     Hx of Mild  . PVC and PACs   . Squamous carcinoma   . Syncope   . Tricuspid regurgitation    Moderate regurgitation  . Tubular adenoma of colon 02/2012    PAST SURGICAL HISTORY: Past Surgical History:  Procedure Laterality Date  . ABDOMINAL HYSTERECTOMY  1980   Hx of  . APPENDECTOMY  1954   Hx of  . CARDIAC CATHETERIZATION     Revealed an apical wall motion abnormality  . CARDIOVASCULAR STRESS TEST  02-21-2006   EF 84%, which revealed an apical defect  . CATARACT EXTRACTION Bilateral   . NASAL RECONSTRUCTION  1981  . PACEMAKER INSERTION    . PERMANENT PACEMAKER INSERTION N/A 04/13/2011   Procedure: PERMANENT PACEMAKER INSERTION;  Surgeon: Evans Lance, MD;  Location: [facility] CATH LAB;  Service: Cardiovascular;  Laterality: N/A;  . SKIN CANCER EXCISION     several, basal cell, squamous, melanoma  . TONSILLECTOMY  1971   Hx of    FAMILY HISTORY: Family History  Problem Relation Age of Onset  . Stroke Mother   . Kidney disease Father   . Lung cancer Brother   . Kidney disease Sister   . Stroke Brother   . Heart disease Brother        x 2  . Liver cancer Sister   . Breast cancer Neg Hx     SOCIAL HISTORY: Social History   Socioeconomic History  . Marital  status: Widowed    Spouse name: Not on file  . Number of children: 0  . Years of education: Not on file  . Highest education level: Not on file  Occupational History  . Occupation: Retired OR Marine scientist  Tobacco Use  . Smoking status: Never Smoker  . Smokeless tobacco: Never Used  Vaping Use  . Vaping Use: Never used  Substance and Sexual Activity  . Alcohol use: Yes    Comment: Rarely Wine with dinner   . Drug use: No  .  Sexual activity: Not on file  Other Topics Concern  . Not on file  Social History Narrative    Lives alone.     Has adopted son, friend Wille Glaser   Right handed       Social Determinants of Health   Financial Resource Strain: Not on file  Food Insecurity: Not on file  Transportation Needs: Not on file  Physical Activity: Not on file  Stress: Not on file  Social Connections: Not on file  Intimate Partner Violence: Not on file   PHYSICAL EXAM  Vitals:   08/24/20 1328  BP: (!) 150/63  Pulse: 75  Weight: 107 lb (48.5 kg)  Height: 5\' 2"  (1.575 m)   Body mass index is 19.57 kg/m.  Generalized: Well developed, in no acute distress  MMSE - Mini Mental State Exam 08/24/2020 02/25/2020  Orientation to time 2 4  Orientation to Place 3 4  Registration 3 3  Attention/ Calculation 1 0  Recall 1 1  Language- name 2 objects 2 2  Language- repeat 0 0  Language- follow 3 step command 2 3  Language- read & follow direction 1 1  Write a sentence 1 1  Copy design 0 1  Total score 16 20    Neurological examination  Mentation: Alert oriented to time, place, history is equally provided by patient and granddaughter. Follows all commands speech and language fluent Cranial nerve II-XII: Pupils were equal round reactive to light. Extraocular movements were full, visual field were full on confrontational test, horizontal movement was normal (Dr. Jannifer Franklin noted vertical gaze restriction). Facial sensation and strength were normal. Head turning and shoulder shrug  were normal  and symmetric. Motor: Good strength all extremities is noted. Sensory: Sensory testing is intact to soft touch on all 4 extremities. No evidence of extinction is noted.  Coordination: Cerebellar testing reveals good finger-nose-finger and heel-to-shin bilaterally.  Gait and station: Gait is normal, slightly swift Reflexes: Deep tendon reflexes are symmetric and normal bilaterally.   DIAGNOSTIC DATA (LABS, IMAGING, TESTING) - I reviewed patient records, labs, notes, testing and imaging myself where available.  Lab Results  Component Value Date   WBC 8.5 05/15/2018   HGB 12.2 05/15/2018   HCT 38.1 05/15/2018   MCV 102.4 (H) 05/15/2018   PLT 213 05/15/2018      Component Value Date/Time   NA 141 09/16/2018 1613   K 4.4 09/16/2018 1613   CL 100 09/16/2018 1613   CO2 27 09/16/2018 1613   GLUCOSE 90 09/16/2018 1613   GLUCOSE 108 (H) 05/15/2018 0434   BUN 14 09/16/2018 1613   CREATININE 0.85 09/16/2018 1613   CALCIUM 10.0 09/16/2018 1613   PROT 7.1 09/16/2018 1613   ALBUMIN 4.8 (H) 09/16/2018 1613   AST 27 09/16/2018 1613   ALT 15 09/16/2018 1613   ALKPHOS 69 09/16/2018 1613   BILITOT 0.3 09/16/2018 1613   GFRNONAA 61 09/16/2018 1613   GFRAA 71 09/16/2018 1613   Lab Results  Component Value Date   CHOL 210 (H) 11/20/2011   HDL 108.40 11/20/2011   LDLDIRECT 87.5 11/20/2011   TRIG 103.0 11/20/2011   CHOLHDL 2 11/20/2011   Lab Results  Component Value Date   HGBA1C 5.7 (H) 07/24/2012   No results found for: [id number] Lab Results  Component Value Date   TSH 2.240 07/24/2012   ASSESSMENT AND PLAN 87 y.o. year old female  has a past medical history of Arteriovenous malformation of colon, Basal cell carcinoma, CAD (coronary artery disease)  no obstruction at cath, Carotid sinus hypersensitivity, Complete heart block-intermittent, Diastolic dysfunction, Diverticulosis, Fall (05/2018), Heart disease, Hyperlipidemia, Hypertension, Kidney stones, Melanoma ([place]), Memory  difficulty (02/25/2020), MVP (mitral valve prolapse), Myocardial infarction ([place]), Orthostasis, Pacemaker, Pulmonary hypertension ([place]), PVC and PACs, Squamous carcinoma, Syncope, Tricuspid regurgitation, and Tubular adenoma of colon (02/2012). here with:  1.  Memory disturbance  -Decline, MMSE 16/30, was 20/30 at last visit -Has not been able to tolerate Aricept or Namenda due to side effect -CT head relatively unremarkable, mild small vessel disease was seen, no explanation for memory issues -Has an aide in the home, providing more supervision, looking to establish this 7 days a week -Dementia packet given to granddaughter, discussed consideration of more supervision, need to plan for future plan -Will follow-up in 6 months or sooner if needed  I spent 30 minutes of face-to-face and non-face-to-face time with patient.  This included previsit chart review, lab review, study review, order entry, memory score, medications, need for supervision in future.  Butler Denmark, AGNP-C, DNP 08/24/2020, 1:37 PM [facility] [address] [place], [place] [address] [phone]

## 2020-08-24 NOTE — Patient Instructions (Signed)
Will hold off on memory medications  Closely monitor for need for increased supervision  See you back in 6 months

## 2020-09-06 DIAGNOSIS — I700 Atherosclerosis of aorta: Secondary | ICD-10-CM | POA: Diagnosis not present

## 2020-09-06 DIAGNOSIS — M25552 Pain in left hip: Secondary | ICD-10-CM | POA: Diagnosis not present

## 2020-09-06 DIAGNOSIS — M1990 Unspecified osteoarthritis, unspecified site: Secondary | ICD-10-CM | POA: Diagnosis not present

## 2020-09-06 DIAGNOSIS — N1831 Chronic kidney disease, stage 3a: Secondary | ICD-10-CM | POA: Diagnosis not present

## 2020-09-06 DIAGNOSIS — M810 Age-related osteoporosis without current pathological fracture: Secondary | ICD-10-CM | POA: Diagnosis not present

## 2020-09-06 DIAGNOSIS — R413 Other amnesia: Secondary | ICD-10-CM | POA: Diagnosis not present

## 2020-09-06 DIAGNOSIS — E7800 Pure hypercholesterolemia, unspecified: Secondary | ICD-10-CM | POA: Diagnosis not present

## 2020-09-06 DIAGNOSIS — Z1331 Encounter for screening for depression: Secondary | ICD-10-CM | POA: Diagnosis not present

## 2020-09-06 DIAGNOSIS — Z7901 Long term (current) use of anticoagulants: Secondary | ICD-10-CM | POA: Diagnosis not present

## 2020-09-06 DIAGNOSIS — I1310 Hypertensive heart and chronic kidney disease without heart failure, with stage 1 through stage 4 chronic kidney disease, or unspecified chronic kidney disease: Secondary | ICD-10-CM | POA: Diagnosis not present

## 2020-09-06 DIAGNOSIS — I341 Nonrheumatic mitral (valve) prolapse: Secondary | ICD-10-CM | POA: Diagnosis not present

## 2020-09-06 DIAGNOSIS — I951 Orthostatic hypotension: Secondary | ICD-10-CM | POA: Diagnosis not present

## 2020-09-07 ENCOUNTER — Ambulatory Visit (INDEPENDENT_AMBULATORY_CARE_PROVIDER_SITE_OTHER): Payer: Medicare Other

## 2020-09-07 DIAGNOSIS — I442 Atrioventricular block, complete: Secondary | ICD-10-CM

## 2020-09-07 LAB — CUP PACEART REMOTE DEVICE CHECK
Battery Remaining Longevity: 36 mo
Battery Remaining Percentage: 29 %
Battery Voltage: 2.81 V
Brady Statistic AP VP Percent: 1.1 %
Brady Statistic AP VS Percent: 17 %
Brady Statistic AS VP Percent: 1.7 %
Brady Statistic AS VS Percent: 79 %
Brady Statistic RA Percent Paced: 16 %
Brady Statistic RV Percent Paced: 2.9 %
Date Time Interrogation Session: 20220607032244
Implantable Lead Implant Date: 20130110
Implantable Lead Implant Date: 20130110
Implantable Lead Location: 753859
Implantable Lead Location: 753860
Implantable Pulse Generator Implant Date: 20130110
Lead Channel Impedance Value: 310 Ohm
Lead Channel Impedance Value: 400 Ohm
Lead Channel Pacing Threshold Amplitude: 0.5 V
Lead Channel Pacing Threshold Amplitude: 1 V
Lead Channel Pacing Threshold Pulse Width: 0.4 ms
Lead Channel Pacing Threshold Pulse Width: 0.4 ms
Lead Channel Sensing Intrinsic Amplitude: 12 mV
Lead Channel Sensing Intrinsic Amplitude: 2.7 mV
Lead Channel Setting Pacing Amplitude: 2 V
Lead Channel Setting Pacing Amplitude: 2.5 V
Lead Channel Setting Pacing Pulse Width: 0.4 ms
Lead Channel Setting Sensing Sensitivity: 4.5 mV
Pulse Gen Model: 2210
Pulse Gen Serial Number: 7303108

## 2020-09-27 ENCOUNTER — Encounter: Payer: Self-pay | Admitting: Orthopaedic Surgery

## 2020-09-27 ENCOUNTER — Other Ambulatory Visit: Payer: Self-pay

## 2020-09-27 ENCOUNTER — Ambulatory Visit (INDEPENDENT_AMBULATORY_CARE_PROVIDER_SITE_OTHER): Payer: Medicare Other | Admitting: Orthopaedic Surgery

## 2020-09-27 ENCOUNTER — Ambulatory Visit (INDEPENDENT_AMBULATORY_CARE_PROVIDER_SITE_OTHER): Payer: Medicare Other

## 2020-09-27 VITALS — Ht 62.0 in | Wt 107.0 lb

## 2020-09-27 DIAGNOSIS — M25552 Pain in left hip: Secondary | ICD-10-CM

## 2020-09-27 DIAGNOSIS — M7062 Trochanteric bursitis, left hip: Secondary | ICD-10-CM

## 2020-09-27 NOTE — Progress Notes (Signed)
Office Visit Note   Patient: Joy Newman           Date of Birth: 05-21-1927           MRN: [id number] Visit Date: 09/27/2020              Requested by: Haywood Pao, MD [address] [place],  [place] [address] PCP: Haywood Pao, MD   Assessment & Plan: Visit Diagnoses:  1. Pain in left hip   2. Trochanteric bursitis, left hip     Plan: She tolerated the trochanteric injection well today.  She will work on IT band stretching exercises as shown in head to her demonstrate back.  Questions were encouraged and answered by Dr. Ninfa Linden and myself.  Follow-up as needed.  Follow-Up Instructions: No follow-ups on file.   Orders:  Orders Placed This Encounter  Procedures   XR HIP UNILAT W OR W/O PELVIS 1V LEFT   No orders of the defined types were placed in this encounter.     Procedures: No procedures performed   Clinical Data: No additional findings.   Subjective: Chief Complaint  Patient presents with   Left Hip - Pain    HPI Joy Newman is a 87 year old female comes in today with left hip pain off and on for some time.  She is accompanied by her son.  She states sometimes the pain is intense mostly left buttocks and hip region.  No numbness tingling down the leg.  She denies any groin pain.  Her son who accompanies her today states she limps at times.  She does not use any assistive device.  She has had no known injury.  She is nondiabetic.  Denies any recent fevers, chills, or shortness of breath. Review of Systems  Constitutional:  Negative for chills and fever.    Objective: Vital Signs: Ht 5\' 2"  (1.575 m)   Wt 107 lb (48.5 kg)   BMI 19.57 kg/m   Physical Exam Constitutional:      Appearance: She is normal weight. She is not ill-appearing or diaphoretic.  Pulmonary:     Effort: Pulmonary effort is normal.  Neurological:     Mental Status: She is alert and oriented to person, place, and time.  Psychiatric:        Mood and Affect: Mood  normal.    Ortho Exam Bilateral hips excellent range of motion without pain.  She has tight hamstrings bilaterally.  Tenderness over the greater trochanteric region of both hips left greater than right.  Lower extremity strength 5 out of 5 throughout.  Negative straight leg raise bilaterally.  Specialty Comments:  No specialty comments available.  Imaging: No results found.   PMFS History: Patient Active Problem List   Diagnosis Date Noted   Memory difficulty 02/25/2020   PAF (paroxysmal atrial fibrillation) ([place]) 05/14/2018   CAD (coronary artery disease) 05/14/2018   Injury of face    Subdural hematoma ([place]) 11/20/2014   Syncope and collapse 11/20/2014   Basal cell carcinoma 67/03/4579   Complicated laceration of lip 11/20/2014   Noise reversion on the ventricular lead 05/14/2012   Pacemaker-St Judes    Atrioventricular block, complete ([place]) 04/12/2011   Carotid sinus hypersensitivity    Hypertension    PVC and PACs    Syncope 04/11/2011   Hyperlipidemia    Diastolic dysfunction    Mitral valve prolapse    Tricuspid regurgitation    History of Arteriovenous malformation of colon    Past Medical History:  Diagnosis Date   Arteriovenous malformation of colon    Basal cell carcinoma    CAD (coronary artery disease) no obstruction at cath    Hx of with an anteroapical M.I   Carotid sinus hypersensitivity    Complete heart block-intermittent    Diastolic dysfunction    Diverticulosis    Fall 05/2018   Heart disease    Hyperlipidemia    Hypertension    Kidney stones    Melanoma ([place])    Memory difficulty 02/25/2020   MVP (mitral valve prolapse)    Myocardial infarction [facility])    2005   Orthostasis    Pacemaker    St Jude   Pulmonary hypertension (HCC)     Hx of Mild   PVC and PACs    Squamous carcinoma    Syncope    Tricuspid regurgitation    Moderate regurgitation   Tubular adenoma of colon 02/2012    Family History  Problem Relation Age of Onset    Stroke Mother    Kidney disease Father    Lung cancer Brother    Kidney disease Sister    Stroke Brother    Heart disease Brother        x 2   Liver cancer Sister    Breast cancer Neg Hx     Past Surgical History:  Procedure Laterality Date   ABDOMINAL HYSTERECTOMY  1980   Hx of   APPENDECTOMY  1954   Hx of   CARDIAC CATHETERIZATION     Revealed an apical wall motion abnormality   CARDIOVASCULAR STRESS TEST  02-21-2006   EF 84%, which revealed an apical defect   CATARACT EXTRACTION Bilateral    NASAL RECONSTRUCTION  1981   PACEMAKER INSERTION     PERMANENT PACEMAKER INSERTION N/A 04/13/2011   Procedure: PERMANENT PACEMAKER INSERTION;  Surgeon: Evans Lance, MD;  Location: [facility] CATH LAB;  Service: Cardiovascular;  Laterality: N/A;   SKIN CANCER EXCISION     several, basal cell, squamous, melanoma   TONSILLECTOMY  1971   Hx of   Social History   Occupational History   Occupation: Retired Haematologist  Tobacco Use   Smoking status: Never   Smokeless tobacco: Never  Vaping Use   Vaping Use: Never used  Substance and Sexual Activity   Alcohol use: Yes    Comment: Rarely Wine with dinner    Drug use: No   Sexual activity: Not on file

## 2020-09-29 NOTE — Progress Notes (Signed)
Remote pacemaker transmission.   

## 2020-11-26 NOTE — Progress Notes (Signed)
Feeling better     Patient Care Team: Tisovec, Fransico Him, MD as PCP - General (Internal Medicine)   HPI  Joy Newman is a 87 y.o. female Seen in followup for pacemaker implantation January 13 for syncope and intermittent complete heart block. She had carotid sinus hypersensitivity and antecedent PR prolongation suggestive of a neurally mediated trigger. Remote history of myocardial infarction and ischemic heart disease.   Echo 2013 demonstrated normal left ventricular function   She told me about her support of the [facility] in [place] received a reward for her effort; that is now complete  t unreported Tylenol at he patient denies chest pain, shortness of breath, nocturnal dyspnea, orthopnea or peripheral edema.  There have been no palpitations, lightheadedness or syncope.      DATE TEST EF   2/20 Echo  >65 %     Date Cr K Hgb  2/20 0.85 3.5 12.2  6/20 0.85 4.4      Past Medical History:  Diagnosis Date   Arteriovenous malformation of colon    Basal cell carcinoma    CAD (coronary artery disease) no obstruction at cath    Hx of with an anteroapical M.I   Carotid sinus hypersensitivity    Complete heart block-intermittent    Diastolic dysfunction    Diverticulosis    Fall 05/2018   Heart disease    Hyperlipidemia    Hypertension    Kidney stones    Melanoma ([place])    Memory difficulty 02/25/2020   MVP (mitral valve prolapse)    Myocardial infarction [facility])    2005   Orthostasis    Pacemaker    St Jude   Pulmonary hypertension (HCC)     Hx of Mild   PVC and PACs    Squamous carcinoma    Syncope    Tricuspid regurgitation    Moderate regurgitation   Tubular adenoma of colon 02/2012    Past Surgical History:  Procedure Laterality Date   ABDOMINAL HYSTERECTOMY  1980   Hx of   APPENDECTOMY  1954   Hx of   CARDIAC CATHETERIZATION     Revealed an apical wall motion abnormality   CARDIOVASCULAR STRESS TEST  02-21-2006    EF 84%, which revealed an apical defect   CATARACT EXTRACTION Bilateral    NASAL RECONSTRUCTION  1981   PACEMAKER INSERTION     PERMANENT PACEMAKER INSERTION N/A 04/13/2011   Procedure: PERMANENT PACEMAKER INSERTION;  Surgeon: Evans Lance, MD;  Location: [facility] CATH LAB;  Service: Cardiovascular;  Laterality: N/A;   SKIN CANCER EXCISION     several, basal cell, squamous, melanoma   TONSILLECTOMY  1971   Hx of    Current Outpatient Medications  Medication Sig Dispense Refill   alendronate (FOSAMAX) 70 MG tablet Take 70 mg by mouth once a week.     apixaban (ELIQUIS) 2.5 MG TABS tablet Take 1 tablet (2.5 mg total) by mouth 2 (two) times daily. Re-tart this medicine on 11/27/14 60 tablet 11   buPROPion (WELLBUTRIN) 75 MG tablet Take 75 mg by mouth. 1/2 tab in the am and 1/2 tab in the pm.     Cholecalciferol 50 MCG (2000 UT) TABS Take 1 tablet by mouth daily.     diltiazem (CARDIZEM CD) 180 MG 24 hr capsule Take 1 capsule (180 mg total) by mouth every morning.     Multiple Vitamin (MULTIVITAMIN WITH MINERALS) TABS tablet Take 1 tablet by mouth daily.     pantoprazole (  PROTONIX) 40 MG tablet Take 40 mg by mouth daily.      rosuvastatin (CRESTOR) 5 MG tablet Take 5 mg by mouth daily.     sertraline (ZOLOFT) 25 MG tablet Take 25 mg by mouth in the morning and at bedtime.     No current facility-administered medications for this visit.    Allergies  Allergen Reactions   Aricept [Donepezil]     Irritability, vivid dreams   Imdur [Isosorbide Mononitrate]     Intolerant and cause headaches and nausea   Isosorbide Nitrate Other (See Comments) and Nausea Only   Polysporin [Bacitracin-Polymyxin B]     Rash   Quinine Derivatives     Increase in Heart Rate and Decrease in BP    Tetracyclines & Related     Rash    Review of Systems negative except from HPI and PMH  Physical Exam BP 130/64   Pulse 76   Ht '5\' 2"'$  (1.575 m)   Wt 110 lb (49.9 kg)   SpO2 98%   BMI 20.12 kg/m  Well  developed and well nourished in no acute distress HENT normal Neck supple with JVP-flat Clear Device pocket well healed; without hematoma or erythema.  There is no tethering  Regular rate and rhythm, no murmur Abd-soft with active BS No Clubbing cyanosis  edema Skin-warm and dry A & Oriented  Grossly normal sensory and motor function  ECG sinus @ 76 19/07/38    Assessment and  Plan  Atrial fibrillation-paroxysmal  Atrial tach -nonsustained   Pacemaker-St. Jude The patient's device was interrogated.  The information was reviewed.  Device was reprogrammed to have atrial sensing exclude the ring electrode  Syncope-carotid sinus hypersensitivity  Ischemic heart disease-apical wall motion abnormality 2004 but without significant obstruction  Intermittent complete heart block  Nose reversion-atrial/ventricular lead with false afib detection ( ring electrode)    No identified interval atrial fibrillation; continue anticoagulation Eliquis 2.5 mg twice daily.  According to her accompanying friend, blood work was obtained by Dr. Osborne Casco.  Blood pressure reasonably controlled.  We will continue on diltiazem 180 mg daily and Crestor 5

## 2020-11-30 ENCOUNTER — Ambulatory Visit (INDEPENDENT_AMBULATORY_CARE_PROVIDER_SITE_OTHER): Payer: Medicare Other | Admitting: Internal Medicine

## 2020-11-30 ENCOUNTER — Other Ambulatory Visit: Payer: Self-pay

## 2020-11-30 ENCOUNTER — Encounter: Payer: Self-pay | Admitting: Internal Medicine

## 2020-11-30 VITALS — BP 130/64 | HR 76 | Ht 62.0 in | Wt 110.0 lb

## 2020-11-30 DIAGNOSIS — I442 Atrioventricular block, complete: Secondary | ICD-10-CM | POA: Diagnosis not present

## 2020-11-30 DIAGNOSIS — Z950 Presence of cardiac pacemaker: Secondary | ICD-10-CM | POA: Diagnosis not present

## 2020-11-30 NOTE — Patient Instructions (Addendum)
Medication Instructions:  Your physician recommends that you continue on your current medications as directed. Please refer to the Current Medication list given to you today.  Labwork: None ordered.  Testing/Procedures: None ordered.  Follow-Up: Your physician recommends that you schedule a follow-up appointment in:   10 months with Dr. Caryl Comes  Any Other Special Instructions Will Be Listed Below (If Applicable).     If you need a refill on your cardiac medications before your next appointment, please call your pharmacy.

## 2020-12-07 ENCOUNTER — Ambulatory Visit (INDEPENDENT_AMBULATORY_CARE_PROVIDER_SITE_OTHER): Payer: Medicare Other

## 2020-12-07 DIAGNOSIS — I442 Atrioventricular block, complete: Secondary | ICD-10-CM

## 2020-12-07 LAB — CUP PACEART REMOTE DEVICE CHECK
Battery Remaining Longevity: 10 mo
Battery Remaining Percentage: 9 %
Battery Voltage: 2.8 V
Brady Statistic AP VP Percent: 1 %
Brady Statistic AP VS Percent: 16 %
Brady Statistic AS VP Percent: 1.7 %
Brady Statistic AS VS Percent: 80 %
Brady Statistic RA Percent Paced: 15 %
Brady Statistic RV Percent Paced: 2.8 %
Date Time Interrogation Session: 20220906035739
Implantable Lead Implant Date: 20130110
Implantable Lead Implant Date: 20130110
Implantable Lead Location: 753859
Implantable Lead Location: 753860
Implantable Pulse Generator Implant Date: 20130110
Lead Channel Impedance Value: 360 Ohm
Lead Channel Impedance Value: 430 Ohm
Lead Channel Pacing Threshold Amplitude: 0.5 V
Lead Channel Pacing Threshold Amplitude: 0.75 V
Lead Channel Pacing Threshold Pulse Width: 0.4 ms
Lead Channel Pacing Threshold Pulse Width: 0.4 ms
Lead Channel Sensing Intrinsic Amplitude: 12 mV
Lead Channel Sensing Intrinsic Amplitude: 2.6 mV
Lead Channel Setting Pacing Amplitude: 2 V
Lead Channel Setting Pacing Amplitude: 2.25 V
Lead Channel Setting Pacing Pulse Width: 0.4 ms
Lead Channel Setting Sensing Sensitivity: 4.5 mV
Pulse Gen Model: 2210
Pulse Gen Serial Number: 7303108

## 2020-12-16 NOTE — Progress Notes (Signed)
Remote pacemaker transmission.   

## 2020-12-23 DIAGNOSIS — Z961 Presence of intraocular lens: Secondary | ICD-10-CM | POA: Diagnosis not present

## 2020-12-23 DIAGNOSIS — H524 Presbyopia: Secondary | ICD-10-CM | POA: Diagnosis not present

## 2021-02-19 IMAGING — CT CT HEAD W/O CM
3 series · 15 of 47 positions shown, 18 images · non-contrast
Comparison: 03/03/2020

CLINICAL DATA: Fall

EXAM:
CT HEAD WITHOUT CONTRAST
TECHNIQUE: Contiguous axial images were obtained from the base of the skull
through the vertex without intravenous contrast.

[Series 2: head wo · axial · 0.47mm/px · z∈[-127,-2]mm · 9 of 30 slices shown, 12 images]
[im 3/30  brain]
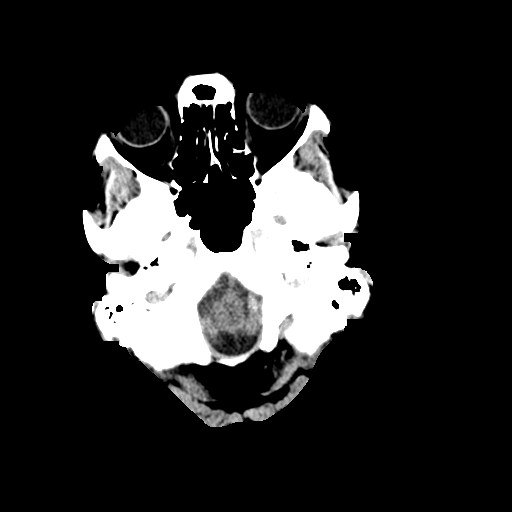
[im 3/30  bone]
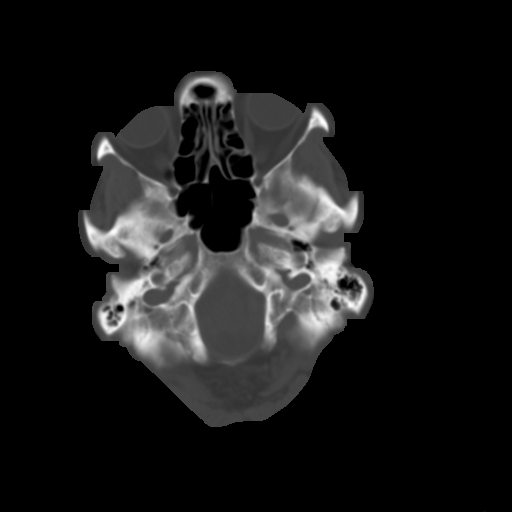
[im 6/30  brain]
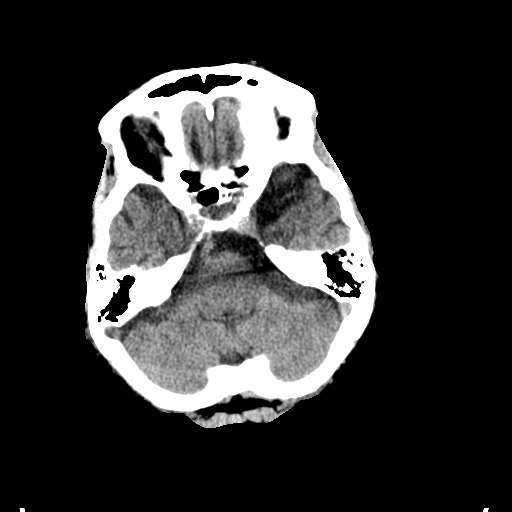
[im 9/30  brain]
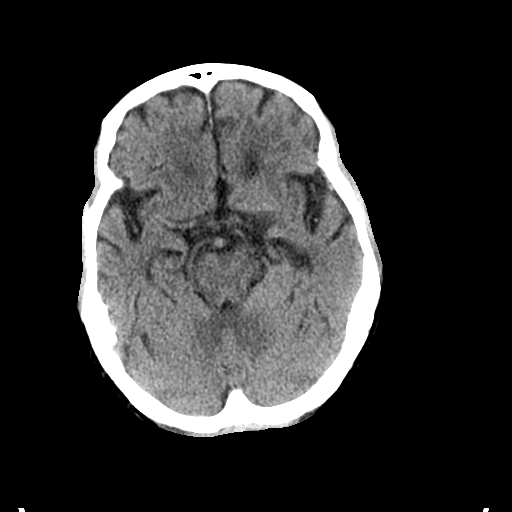
[im 12/30  brain]
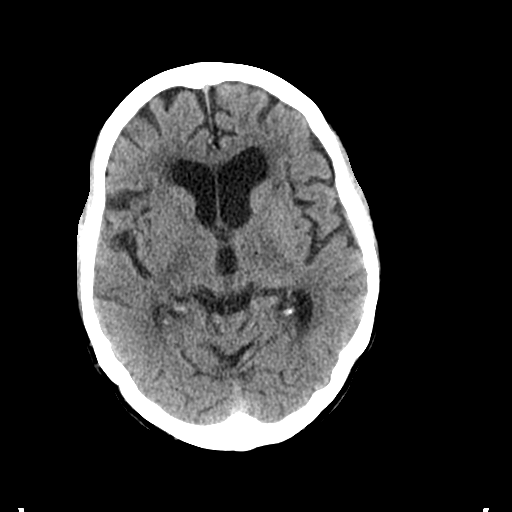
[im 16/30  brain]
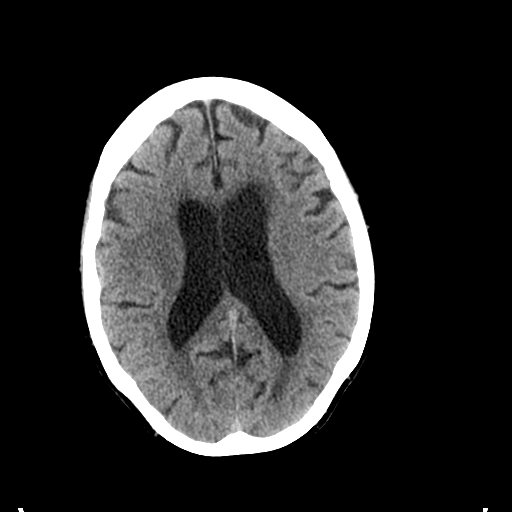
[im 16/30  bone]
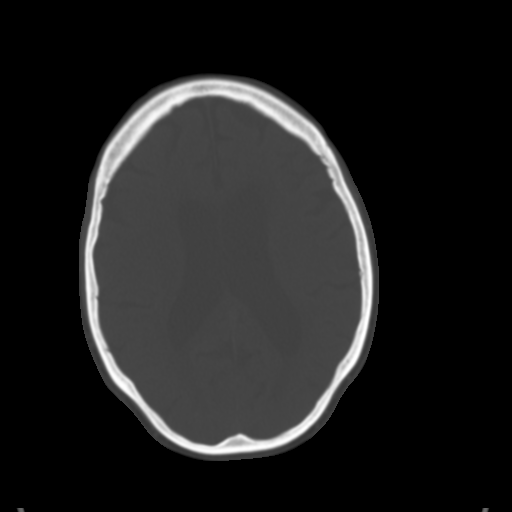
[im 19/30  brain]
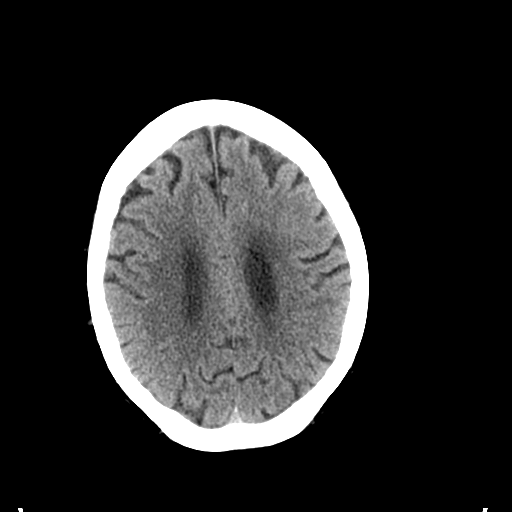
[im 22/30  brain]
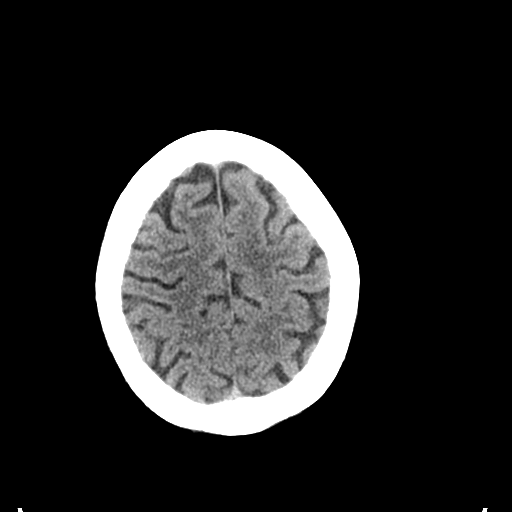
[im 25/30  brain]
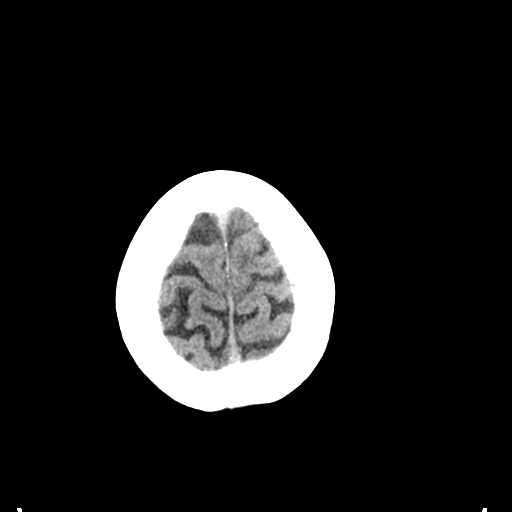
[im 28/30  brain]
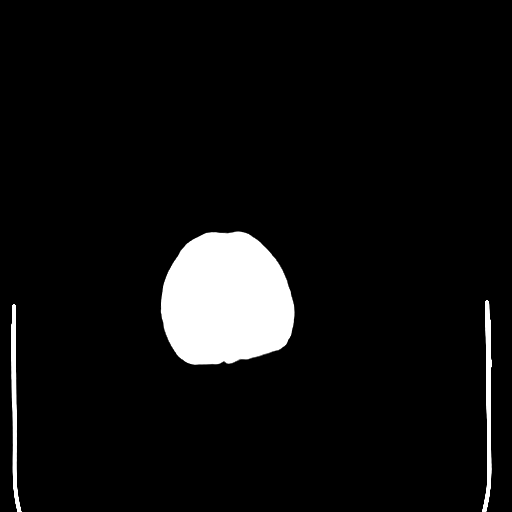
[im 28/30  bone]
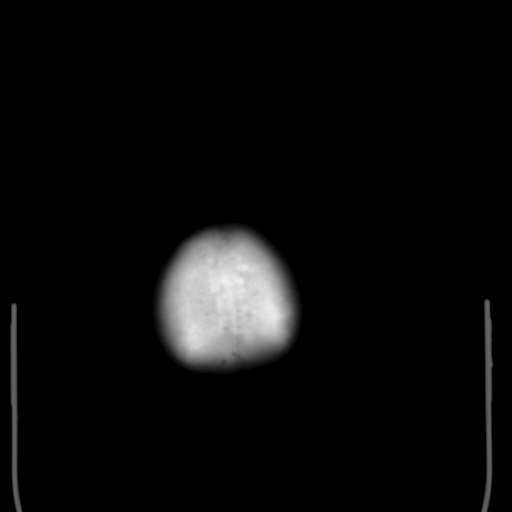

[Series 5: coronal soft tissue · coronal · 0.29mm/px · 3 of 71 slices shown]
[im 24/71  brain]
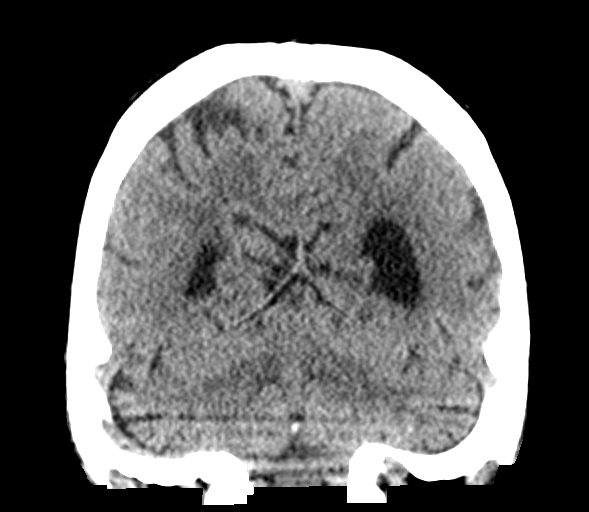
[im 32/71  brain]
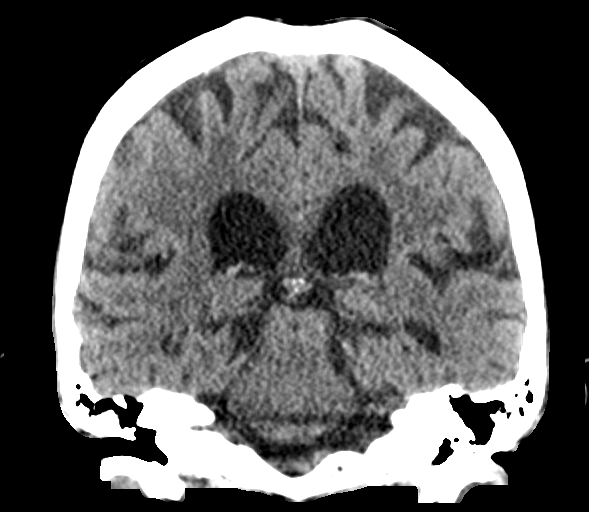
[im 39/71  brain]
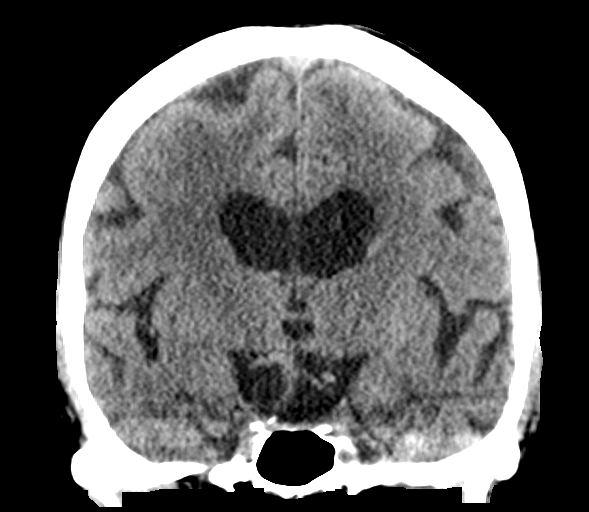

[Series 6: sagittal soft tissue · sagittal · 0.29mm/px · 3 of 55 slices shown]
[im 19/55  brain]
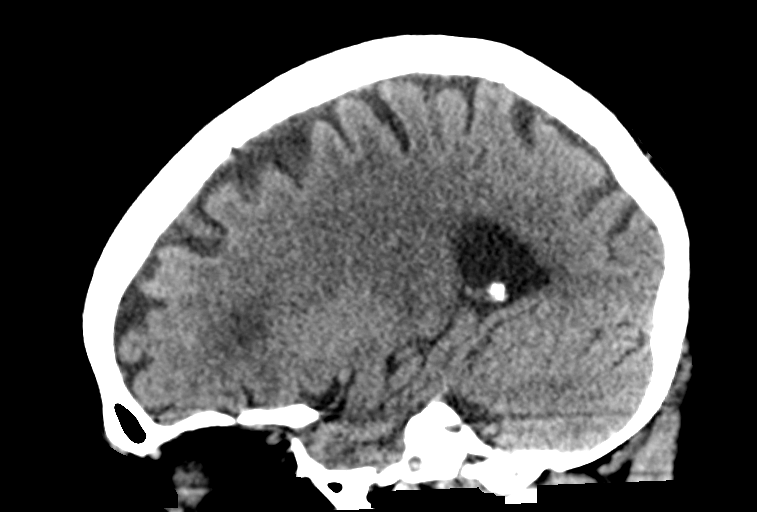
[im 28/55  brain]
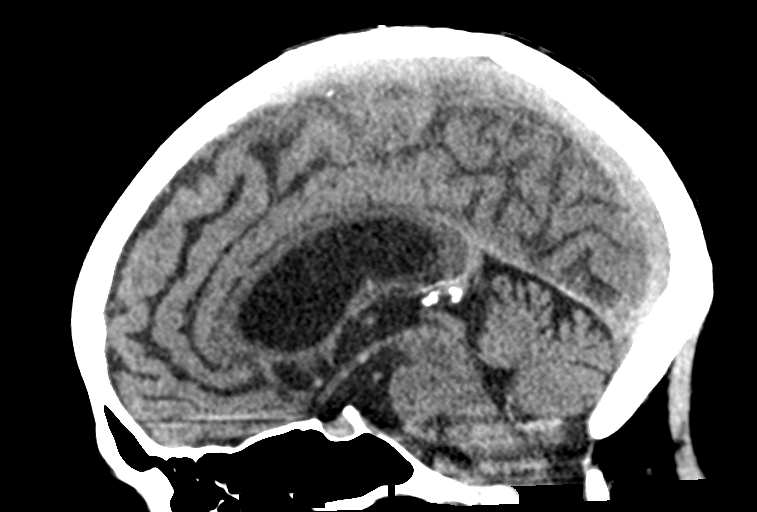
[im 37/55  brain]
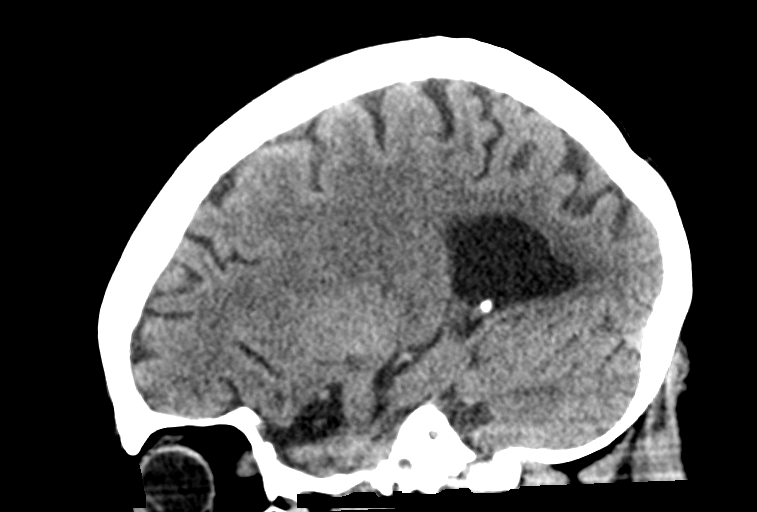

[15 of 47 positions shown; findings below may reference images not displayed]

FINDINGS: Brain: There is no acute intracranial hemorrhage, mass effect, or
edema. Gray-white differentiation is preserved. There is no
extra-axial fluid collection. Ventricles and sulci are stable in
size and configuration. Patchy hypoattenuation in the supratentorial
white matter likely reflects stable chronic microvascular ischemic
changes.

Vascular: There is atherosclerotic calcification at the skull base.

Skull: Calvarium is unremarkable.

Sinuses/Orbits: Mild mucosal thickening. No significant orbital
abnormality.

Other: None.
IMPRESSION: No evidence of acute intracranial injury. No significant change
since recent prior study.

## 2021-03-02 DIAGNOSIS — E7800 Pure hypercholesterolemia, unspecified: Secondary | ICD-10-CM | POA: Diagnosis not present

## 2021-03-02 DIAGNOSIS — E559 Vitamin D deficiency, unspecified: Secondary | ICD-10-CM | POA: Diagnosis not present

## 2021-03-07 ENCOUNTER — Ambulatory Visit: Payer: Medicare Other | Admitting: Neurology

## 2021-03-08 ENCOUNTER — Ambulatory Visit (INDEPENDENT_AMBULATORY_CARE_PROVIDER_SITE_OTHER): Payer: Medicare Other

## 2021-03-08 DIAGNOSIS — I480 Paroxysmal atrial fibrillation: Secondary | ICD-10-CM

## 2021-03-08 LAB — CUP PACEART REMOTE DEVICE CHECK
Battery Remaining Longevity: 8 mo
Battery Remaining Percentage: 7 %
Battery Voltage: 2.78 V
Brady Statistic AP VP Percent: 1 %
Brady Statistic AP VS Percent: 15 %
Brady Statistic AS VP Percent: 2.1 %
Brady Statistic AS VS Percent: 81 %
Brady Statistic RA Percent Paced: 14 %
Brady Statistic RV Percent Paced: 3.1 %
Date Time Interrogation Session: 20221206032727
Implantable Lead Implant Date: 20130110
Implantable Lead Implant Date: 20130110
Implantable Lead Location: 753859
Implantable Lead Location: 753860
Implantable Pulse Generator Implant Date: 20130110
Lead Channel Impedance Value: 350 Ohm
Lead Channel Impedance Value: 410 Ohm
Lead Channel Pacing Threshold Amplitude: 0.5 V
Lead Channel Pacing Threshold Amplitude: 0.75 V
Lead Channel Pacing Threshold Pulse Width: 0.4 ms
Lead Channel Pacing Threshold Pulse Width: 0.4 ms
Lead Channel Sensing Intrinsic Amplitude: 12 mV
Lead Channel Sensing Intrinsic Amplitude: 3 mV
Lead Channel Setting Pacing Amplitude: 2 V
Lead Channel Setting Pacing Amplitude: 2.25 V
Lead Channel Setting Pacing Pulse Width: 0.4 ms
Lead Channel Setting Sensing Sensitivity: 4.5 mV
Pulse Gen Model: 2210
Pulse Gen Serial Number: 7303108

## 2021-03-09 DIAGNOSIS — M1990 Unspecified osteoarthritis, unspecified site: Secondary | ICD-10-CM | POA: Diagnosis not present

## 2021-03-09 DIAGNOSIS — I1310 Hypertensive heart and chronic kidney disease without heart failure, with stage 1 through stage 4 chronic kidney disease, or unspecified chronic kidney disease: Secondary | ICD-10-CM | POA: Diagnosis not present

## 2021-03-09 DIAGNOSIS — I4821 Permanent atrial fibrillation: Secondary | ICD-10-CM | POA: Diagnosis not present

## 2021-03-09 DIAGNOSIS — N1831 Chronic kidney disease, stage 3a: Secondary | ICD-10-CM | POA: Diagnosis not present

## 2021-03-09 DIAGNOSIS — I341 Nonrheumatic mitral (valve) prolapse: Secondary | ICD-10-CM | POA: Diagnosis not present

## 2021-03-09 DIAGNOSIS — R413 Other amnesia: Secondary | ICD-10-CM | POA: Diagnosis not present

## 2021-03-09 DIAGNOSIS — Z7901 Long term (current) use of anticoagulants: Secondary | ICD-10-CM | POA: Diagnosis not present

## 2021-03-09 DIAGNOSIS — D6869 Other thrombophilia: Secondary | ICD-10-CM | POA: Diagnosis not present

## 2021-03-09 DIAGNOSIS — I700 Atherosclerosis of aorta: Secondary | ICD-10-CM | POA: Diagnosis not present

## 2021-03-09 DIAGNOSIS — Z0000 Encounter for general adult medical examination without abnormal findings: Secondary | ICD-10-CM | POA: Diagnosis not present

## 2021-03-09 DIAGNOSIS — Z1339 Encounter for screening examination for other mental health and behavioral disorders: Secondary | ICD-10-CM | POA: Diagnosis not present

## 2021-03-09 DIAGNOSIS — Z23 Encounter for immunization: Secondary | ICD-10-CM | POA: Diagnosis not present

## 2021-03-09 DIAGNOSIS — E7800 Pure hypercholesterolemia, unspecified: Secondary | ICD-10-CM | POA: Diagnosis not present

## 2021-03-09 DIAGNOSIS — K2950 Unspecified chronic gastritis without bleeding: Secondary | ICD-10-CM | POA: Diagnosis not present

## 2021-03-09 DIAGNOSIS — Z950 Presence of cardiac pacemaker: Secondary | ICD-10-CM | POA: Diagnosis not present

## 2021-03-09 DIAGNOSIS — Z1331 Encounter for screening for depression: Secondary | ICD-10-CM | POA: Diagnosis not present

## 2021-03-17 NOTE — Progress Notes (Signed)
Remote pacemaker transmission.   

## 2021-03-30 ENCOUNTER — Other Ambulatory Visit: Payer: Self-pay

## 2021-03-30 ENCOUNTER — Encounter: Payer: Self-pay | Admitting: Neurology

## 2021-03-30 ENCOUNTER — Ambulatory Visit (INDEPENDENT_AMBULATORY_CARE_PROVIDER_SITE_OTHER): Payer: Medicare Other | Admitting: Neurology

## 2021-03-30 DIAGNOSIS — F02B0 Dementia in other diseases classified elsewhere, moderate, without behavioral disturbance, psychotic disturbance, mood disturbance, and anxiety: Secondary | ICD-10-CM | POA: Diagnosis not present

## 2021-03-30 DIAGNOSIS — G309 Alzheimer's disease, unspecified: Secondary | ICD-10-CM | POA: Diagnosis not present

## 2021-03-30 DIAGNOSIS — F0390 Unspecified dementia without behavioral disturbance: Secondary | ICD-10-CM | POA: Insufficient documentation

## 2021-03-30 NOTE — Progress Notes (Addendum)
Newman: Joy Newman DOB: 1927-08-12  REASON FOR VISIT: follow up HISTORY FROM: Newman and family friend, Joy Newman  Primary Neurologist: Dr. Jannifer Franklin, now followed by Dr. Rexene Alberts   HISTORY OF PRESENT ILLNESS: Today 03/30/21 Joy Newman is here today for follow-up for memory. Here today with her family friend, Joy Newman, who is like her son. Living alone, has comfort keepers aide who comes 5 days a week in Joy morning until 1, comes back at 4-7. Helps with medications and meal prep. Has caregivers on Joy weekend as well. Continues to do ADLs independently, some light housework. Fall 2 months ago, walking outside, caught herself. Has pacemaker, needs new battery in 6 months. Sleeps well. Mood is good, very sweet, very pleasant. Joy Newman has cameras to monitor for safety. Sleeping 10-12 hours at night. Weight is up 3 lbs, at 110 lbs. Enjoys reading, walking outside is really enjoyable for her. MMSE 15/30 today. She is former Immunologist.   Update 08/24/2020 SS: Joy Newman is a 87 year old female with history of episodic syncopal events and memory issues. Dr. Jannifer Franklin has noticed vertical gaze restriction with unknown etiology.  CT head was relatively unremarkable, mild small vessel disease was seen.  Could not tolerate Aricept due to irritability and agitation. Namenda resulted in agitation as well, restless. She is living alone still, doing her cleaning, household. Is walking outside, daily. Doesn't drive, gave her car up. Eating well. Has an aide 5 days a week for several hours, trying to get 7 days a week, help with cooking, provide supervision. Here today with her granddaughter. Does well with long term memory, short term is problematic. Pills are in timed pill box, aide monitors medication. 2 falls since last seen, lose balance, or shuffle feet. No recent syncopal episodes. Enjoys walking, reading. MMSE 16/30. No major health issues since last seen. Sleeping well. Uses cane when she walks.    HISTORY  02/25/2020 Dr. Jannifer Franklin: Joy Newman is a 87 year old right-handed white female who was seen previously through this office with episodic syncopal events.  Joy Newman returns at this point with a new problem.  She has noted some changes in memory over Joy last 6 months or so.  Joy Newman is no longer operating a motor vehicle, she stopped driving about 2 years ago, her son who is present today indicates that she had one episode of getting lost when she was driving out of town prior to her stopping her driving.  Joy Newman lives alone, she does her own finances, she keeps up with medications and appointments.  Joy Newman does some cooking and has no problems with this.  She does report some short-term memory issues and occasional word finding.  Joy son indicates that recently they visited her family's farm, when they returned she seemed to be confused about whether she was home or not.  Joy Newman does have some underlying anxiety issues.  She is sleeping well.  She comes to Joy office today for further evaluation.  REVIEW OF SYSTEMS: Out of a complete 14 system review of symptoms, Joy Newman complains only of Joy following symptoms, and all other reviewed systems are negative.  See HPI  ALLERGIES: Allergies  Allergen Reactions   Aricept [Donepezil]     Irritability, vivid dreams   Imdur [Isosorbide Mononitrate]     Intolerant and cause headaches and nausea   Isosorbide Nitrate Other (See Comments) and Nausea Only   Polysporin [Bacitracin-Polymyxin B]     Rash   Quinine Derivatives  Increase in Heart Rate and Decrease in BP    Tetracyclines & Related     Rash    HOME MEDICATIONS: Outpatient Medications Prior to Visit  Medication Sig Dispense Refill   alendronate (FOSAMAX) 70 MG tablet Take 70 mg by mouth once a week.     apixaban (ELIQUIS) 2.5 MG TABS tablet Take 1 tablet (2.5 mg total) by mouth 2 (two) times daily. Re-tart this medicine on 11/27/14 60 tablet 11    buPROPion (WELLBUTRIN) 75 MG tablet Take 100 mg by mouth. 1/2 tab in Joy am and 1/2 tab in Joy pm.     Cholecalciferol 50 MCG (2000 UT) TABS Take 1 tablet by mouth daily.     diltiazem (CARDIZEM CD) 180 MG 24 hr capsule Take 1 capsule (180 mg total) by mouth every morning.     Multiple Vitamin (MULTIVITAMIN WITH MINERALS) TABS tablet Take 1 tablet by mouth daily.     pantoprazole (PROTONIX) 40 MG tablet Take 40 mg by mouth daily.      rosuvastatin (CRESTOR) 5 MG tablet Take 5 mg by mouth daily.     sertraline (ZOLOFT) 25 MG tablet Take 25 mg by mouth in Joy morning and at bedtime.     No facility-administered medications prior to visit.    PAST MEDICAL HISTORY: Past Medical History:  Diagnosis Date   Arteriovenous malformation of colon    Basal cell carcinoma    CAD (coronary artery disease) no obstruction at cath    Hx of with an anteroapical M.I   Carotid sinus hypersensitivity    Complete heart block-intermittent    Diastolic dysfunction    Diverticulosis    Fall 05/2018   Heart disease    Hyperlipidemia    Hypertension    Kidney stones    Melanoma ([place])    Memory difficulty 02/25/2020   MVP (mitral valve prolapse)    Myocardial infarction [facility])    2005   Orthostasis    Pacemaker    St Jude   Pulmonary hypertension (HCC)     Hx of Mild   PVC and PACs    Squamous carcinoma    Syncope    Tricuspid regurgitation    Moderate regurgitation   Tubular adenoma of colon 02/2012    PAST SURGICAL HISTORY: Past Surgical History:  Procedure Laterality Date   ABDOMINAL HYSTERECTOMY  1980   Hx of   APPENDECTOMY  1954   Hx of   CARDIAC CATHETERIZATION     Revealed an apical wall motion abnormality   CARDIOVASCULAR STRESS TEST  02-21-2006   EF 84%, which revealed an apical defect   CATARACT EXTRACTION Bilateral    NASAL RECONSTRUCTION  1981   PACEMAKER INSERTION     PERMANENT PACEMAKER INSERTION N/A 04/13/2011   Procedure: PERMANENT PACEMAKER INSERTION;  Surgeon: Evans Lance, MD;  Location: [facility] CATH LAB;  Service: Cardiovascular;  Laterality: N/A;   SKIN CANCER EXCISION     several, basal cell, squamous, melanoma   TONSILLECTOMY  1971   Hx of    FAMILY HISTORY: Family History  Problem Relation Age of Onset   Stroke Mother    Kidney disease Father    Lung cancer Brother    Kidney disease Sister    Stroke Brother    Heart disease Brother        x 2   Liver cancer Sister    Breast cancer Neg Hx     SOCIAL HISTORY: Social History   Socioeconomic History  Marital status: Widowed    Spouse name: Not on file   Number of children: 0   Years of education: Not on file   Highest education level: Not on file  Occupational History   Occupation: Retired OR Nurse  Tobacco Use   Smoking status: Never   Smokeless tobacco: Never  Vaping Use   Vaping Use: Never used  Substance and Sexual Activity   Alcohol use: Yes    Comment: Rarely Wine with dinner    Drug use: No   Sexual activity: Not on file  Other Topics Concern   Not on file  Social History Narrative    Lives alone.     Has adopted son, friend Joy Newman   Right handed       Social Determinants of Health   Financial Resource Strain: Not on file  Food Insecurity: Not on file  Transportation Needs: Not on file  Physical Activity: Not on file  Stress: Not on file  Social Connections: Not on file  Intimate Partner Violence: Not on file   PHYSICAL EXAM  Vitals:   03/30/21 1304  BP: (!) 150/73  Pulse: 72  Weight: 110 lb (49.9 kg)  Height: 5\' 2"  (1.575 m)    Body mass index is 20.12 kg/m.  Generalized: Well developed, in no acute distress  MMSE - Mini Mental State Exam 03/30/2021 08/24/2020 02/25/2020  Orientation to time 1 2 4   Orientation to Place 3 3 4   Registration 3 3 3   Attention/ Calculation 0 1 0  Recall 0 1 1  Language- name 2 objects 2 2 2   Language- repeat 1 0 0  Language- follow 3 step command 3 2 3   Language- read & follow direction 1 1 1   Write a sentence 1 1  1   Copy design 0 0 1  Total score 15 16 20     Neurological examination  Mentation: Alert oriented to time, place, history is provided by Newman and her son. Follows all commands speech and language fluent, mild trouble following some exam commands Cranial nerve II-XII: Pupils were equal round reactive to light. Extraocular movements were full, visual field were full on confrontational test, horizontal movement was normal (Dr. Jannifer Franklin noted vertical gaze restriction). Facial sensation and strength were normal. Head turning and shoulder shrug were normal and symmetric. Motor: Good strength all extremities is noted. Sensory: Sensory testing is intact to soft touch on all 4 extremities. No evidence of extinction is noted.  Coordination: Cerebellar testing reveals good finger-nose-finger and heel-to-shin bilaterally.  Gait and station: Gait is normal, slightly wide based, cautious Reflexes: Deep tendon reflexes are symmetric and normal bilaterally.   DIAGNOSTIC DATA (LABS, IMAGING, TESTING) - I reviewed Newman records, labs, notes, testing and imaging myself where available.  Lab Results  Component Value Date   WBC 8.5 05/15/2018   HGB 12.2 05/15/2018   HCT 38.1 05/15/2018   MCV 102.4 (H) 05/15/2018   PLT 213 05/15/2018      Component Value Date/Time   NA 141 09/16/2018 1613   K 4.4 09/16/2018 1613   CL 100 09/16/2018 1613   CO2 27 09/16/2018 1613   GLUCOSE 90 09/16/2018 1613   GLUCOSE 108 (H) 05/15/2018 0434   BUN 14 09/16/2018 1613   CREATININE 0.85 09/16/2018 1613   CALCIUM 10.0 09/16/2018 1613   PROT 7.1 09/16/2018 1613   ALBUMIN 4.8 (H) 09/16/2018 1613   AST 27 09/16/2018 1613   ALT 15 09/16/2018 1613   ALKPHOS 69 09/16/2018 1613  BILITOT 0.3 09/16/2018 1613   GFRNONAA 61 09/16/2018 1613   GFRAA 71 09/16/2018 1613   Lab Results  Component Value Date   CHOL 210 (H) 11/20/2011   HDL 108.40 11/20/2011   LDLDIRECT 87.5 11/20/2011   TRIG 103.0 11/20/2011   CHOLHDL 2  11/20/2011   Lab Results  Component Value Date   HGBA1C 5.7 (H) 07/24/2012   No results found for: LOVFIEPP29 Lab Results  Component Value Date   TSH 2.240 07/24/2012   ASSESSMENT AND PLAN 87 y.o. year old female  has a past medical history of Arteriovenous malformation of colon, Basal cell carcinoma, CAD (coronary artery disease) no obstruction at cath, Carotid sinus hypersensitivity, Complete heart block-intermittent, Diastolic dysfunction, Diverticulosis, Fall (05/2018), Heart disease, Hyperlipidemia, Hypertension, Kidney stones, Melanoma ([place]), Memory difficulty (02/25/2020), MVP (mitral valve prolapse), Myocardial infarction ([place]), Orthostasis, Pacemaker, Pulmonary hypertension ([place]), PVC and PACs, Squamous carcinoma, Syncope, Tricuspid regurgitation, and Tubular adenoma of colon (02/2012). here with:  1.  Dementia   -Appears overall stable, MMSE 15/30 today, very pleasant and participatory at visit  -Has not been able to tolerate Aricept or Namenda due to side effects -Family seems to have good system in place for caregivers and supervision, this will need to be closely monitored to ensure she is still safe to live at home alone -CT head relatively unremarkable, mild small vessel disease was seen, no explanation for memory issues -Encouraged to keep up her exercise, but consider doing so indoors with supervision, we also discussed adult day programs -Continue to see PCP, follow-up in 1 year here, will then return here PRN basis, will be followed by Dr. Rexene Alberts since Dr .Jannifer Franklin has retired  I spent 32 minutes of face-to-face and non-face-to-face time with Newman.  This included previsit chart review, lab review, study review, order entry, electronic health record documentation, Newman education, discussing safety at home, plan and follow-up.   Butler Denmark, AGNP-C, DNP 03/30/2021, 1:17 PM [facility] [address], [place], [place] [address] [phone]  I reviewed Joy above note and documentation by Joy Nurse Practitioner and agree with Joy history, exam, assessment and plan as outlined above. I was available for consultation. Star Age, MD, PhD [facility])

## 2021-03-30 NOTE — Patient Instructions (Signed)
Great to see you today  Continue to see your primary care doctor  Continue to monitor for safety  See you back in 1 year or sooner if needed

## 2021-04-05 ENCOUNTER — Telehealth: Payer: Self-pay

## 2021-04-05 NOTE — Telephone Encounter (Signed)
Patient son called back and I have let him know everything was normal and her battery life is 7.9 months. Patient son Jenny Reichmann [facility]) also wants [place] to know since his mom has dementia, if we can call him instead so that nothing gets missed

## 2021-04-05 NOTE — Telephone Encounter (Signed)
03/08/21 Scheduled remote reviewed. Normal device function.  Battery longevity 7.9 months.   Successful telephone encounter to patient and caregiver to discuss need for monthly battery checks beginning 04/11/21. Patient is a manual send and monthly scheduled is provided. Patient and caregiver appreciative of call. Will continue to monitor.

## 2021-04-11 ENCOUNTER — Ambulatory Visit (INDEPENDENT_AMBULATORY_CARE_PROVIDER_SITE_OTHER): Payer: Medicare Other

## 2021-04-11 DIAGNOSIS — Z950 Presence of cardiac pacemaker: Secondary | ICD-10-CM

## 2021-04-12 LAB — CUP PACEART REMOTE DEVICE CHECK
Battery Remaining Longevity: 7 mo
Battery Remaining Percentage: 6 %
Battery Voltage: 2.77 V
Brady Statistic AP VP Percent: 1 %
Brady Statistic AP VS Percent: 15 %
Brady Statistic AS VP Percent: 2.2 %
Brady Statistic AS VS Percent: 81 %
Brady Statistic RA Percent Paced: 14 %
Brady Statistic RV Percent Paced: 3.2 %
Date Time Interrogation Session: 20230109034236
Implantable Lead Implant Date: 20130110
Implantable Lead Implant Date: 20130110
Implantable Lead Location: 753859
Implantable Lead Location: 753860
Implantable Pulse Generator Implant Date: 20130110
Lead Channel Impedance Value: 340 Ohm
Lead Channel Impedance Value: 400 Ohm
Lead Channel Pacing Threshold Amplitude: 0.5 V
Lead Channel Pacing Threshold Amplitude: 0.75 V
Lead Channel Pacing Threshold Pulse Width: 0.4 ms
Lead Channel Pacing Threshold Pulse Width: 0.4 ms
Lead Channel Sensing Intrinsic Amplitude: 12 mV
Lead Channel Sensing Intrinsic Amplitude: 2.3 mV
Lead Channel Setting Pacing Amplitude: 2 V
Lead Channel Setting Pacing Amplitude: 2.25 V
Lead Channel Setting Pacing Pulse Width: 0.4 ms
Lead Channel Setting Sensing Sensitivity: 4.5 mV
Pulse Gen Model: 2210
Pulse Gen Serial Number: 7303108

## 2021-04-19 NOTE — Progress Notes (Signed)
Remote pacemaker transmission.   

## 2021-04-19 NOTE — Addendum Note (Signed)
Addended by: Cheri Kearns A on: 04/19/2021 02:41 PM   Modules accepted: Level of Service

## 2021-05-12 ENCOUNTER — Ambulatory Visit (INDEPENDENT_AMBULATORY_CARE_PROVIDER_SITE_OTHER): Payer: Medicare Other

## 2021-05-12 DIAGNOSIS — Z950 Presence of cardiac pacemaker: Secondary | ICD-10-CM

## 2021-05-12 LAB — CUP PACEART REMOTE DEVICE CHECK
Battery Remaining Longevity: 7 mo
Battery Remaining Percentage: 6 %
Battery Voltage: 2.77 V
Brady Statistic AP VP Percent: 1 %
Brady Statistic AP VS Percent: 15 %
Brady Statistic AS VP Percent: 2.2 %
Brady Statistic AS VS Percent: 81 %
Brady Statistic RA Percent Paced: 14 %
Brady Statistic RV Percent Paced: 3.3 %
Date Time Interrogation Session: 20230209035333
Implantable Lead Implant Date: 20130110
Implantable Lead Implant Date: 20130110
Implantable Lead Location: 753859
Implantable Lead Location: 753860
Implantable Pulse Generator Implant Date: 20130110
Lead Channel Impedance Value: 340 Ohm
Lead Channel Impedance Value: 410 Ohm
Lead Channel Pacing Threshold Amplitude: 0.5 V
Lead Channel Pacing Threshold Amplitude: 0.75 V
Lead Channel Pacing Threshold Pulse Width: 0.4 ms
Lead Channel Pacing Threshold Pulse Width: 0.4 ms
Lead Channel Sensing Intrinsic Amplitude: 12 mV
Lead Channel Sensing Intrinsic Amplitude: 2.6 mV
Lead Channel Setting Pacing Amplitude: 2 V
Lead Channel Setting Pacing Amplitude: 2.25 V
Lead Channel Setting Pacing Pulse Width: 0.4 ms
Lead Channel Setting Sensing Sensitivity: 4.5 mV
Pulse Gen Model: 2210
Pulse Gen Serial Number: 7303108

## 2021-05-17 NOTE — Progress Notes (Signed)
Remote pacemaker transmission.   Remote pacemaker transmission.

## 2021-06-13 ENCOUNTER — Ambulatory Visit (INDEPENDENT_AMBULATORY_CARE_PROVIDER_SITE_OTHER): Payer: Medicare Other

## 2021-06-13 DIAGNOSIS — Z950 Presence of cardiac pacemaker: Secondary | ICD-10-CM

## 2021-06-13 LAB — CUP PACEART REMOTE DEVICE CHECK
Battery Remaining Longevity: 6 mo
Battery Remaining Percentage: 5 %
Battery Voltage: 2.75 V
Brady Statistic AP VP Percent: 1 %
Brady Statistic AP VS Percent: 15 %
Brady Statistic AS VP Percent: 2.1 %
Brady Statistic AS VS Percent: 81 %
Brady Statistic RA Percent Paced: 14 %
Brady Statistic RV Percent Paced: 3.2 %
Date Time Interrogation Session: 20230312030423
Implantable Lead Implant Date: 20130110
Implantable Lead Implant Date: 20130110
Implantable Lead Location: 753859
Implantable Lead Location: 753860
Implantable Pulse Generator Implant Date: 20130110
Lead Channel Impedance Value: 360 Ohm
Lead Channel Impedance Value: 390 Ohm
Lead Channel Pacing Threshold Amplitude: 0.5 V
Lead Channel Pacing Threshold Amplitude: 0.75 V
Lead Channel Pacing Threshold Pulse Width: 0.4 ms
Lead Channel Pacing Threshold Pulse Width: 0.4 ms
Lead Channel Sensing Intrinsic Amplitude: 12 mV
Lead Channel Sensing Intrinsic Amplitude: 2.5 mV
Lead Channel Setting Pacing Amplitude: 2 V
Lead Channel Setting Pacing Amplitude: 2.25 V
Lead Channel Setting Pacing Pulse Width: 0.4 ms
Lead Channel Setting Sensing Sensitivity: 4.5 mV
Pulse Gen Model: 2210
Pulse Gen Serial Number: 7303108

## 2021-06-24 NOTE — Progress Notes (Signed)
Remote pacemaker transmission.   

## 2021-07-13 LAB — CUP PACEART REMOTE DEVICE CHECK
Battery Remaining Longevity: 6 mo
Battery Remaining Percentage: 5 %
Battery Voltage: 2.75 V
Brady Statistic AP VP Percent: 1 %
Brady Statistic AP VS Percent: 15 %
Brady Statistic AS VP Percent: 2.1 %
Brady Statistic AS VS Percent: 82 %
Brady Statistic RA Percent Paced: 14 %
Brady Statistic RV Percent Paced: 3.1 %
Date Time Interrogation Session: 20230412031213
Implantable Lead Implant Date: 20130110
Implantable Lead Implant Date: 20130110
Implantable Lead Location: 753859
Implantable Lead Location: 753860
Implantable Pulse Generator Implant Date: 20130110
Lead Channel Impedance Value: 340 Ohm
Lead Channel Impedance Value: 440 Ohm
Lead Channel Pacing Threshold Amplitude: 0.5 V
Lead Channel Pacing Threshold Amplitude: 0.75 V
Lead Channel Pacing Threshold Pulse Width: 0.4 ms
Lead Channel Pacing Threshold Pulse Width: 0.4 ms
Lead Channel Sensing Intrinsic Amplitude: 1.5 mV
Lead Channel Sensing Intrinsic Amplitude: 12 mV
Lead Channel Setting Pacing Amplitude: 2 V
Lead Channel Setting Pacing Amplitude: 2.25 V
Lead Channel Setting Pacing Pulse Width: 0.4 ms
Lead Channel Setting Sensing Sensitivity: 4.5 mV
Pulse Gen Model: 2210
Pulse Gen Serial Number: 7303108

## 2021-07-14 ENCOUNTER — Ambulatory Visit (INDEPENDENT_AMBULATORY_CARE_PROVIDER_SITE_OTHER): Payer: Medicare Other

## 2021-07-14 DIAGNOSIS — I442 Atrioventricular block, complete: Secondary | ICD-10-CM

## 2021-07-23 DIAGNOSIS — Z20822 Contact with and (suspected) exposure to covid-19: Secondary | ICD-10-CM | POA: Diagnosis not present

## 2021-08-01 NOTE — Progress Notes (Signed)
Remote pacemaker transmission.   

## 2021-08-02 DIAGNOSIS — Z20822 Contact with and (suspected) exposure to covid-19: Secondary | ICD-10-CM | POA: Diagnosis not present

## 2021-08-15 ENCOUNTER — Ambulatory Visit (INDEPENDENT_AMBULATORY_CARE_PROVIDER_SITE_OTHER): Payer: Medicare Other

## 2021-08-15 DIAGNOSIS — I442 Atrioventricular block, complete: Secondary | ICD-10-CM

## 2021-08-16 LAB — CUP PACEART REMOTE DEVICE CHECK
Battery Remaining Longevity: 6 mo
Battery Remaining Percentage: 5 %
Battery Voltage: 2.74 V
Brady Statistic AP VP Percent: 1 %
Brady Statistic AP VS Percent: 14 %
Brady Statistic AS VP Percent: 2.1 %
Brady Statistic AS VS Percent: 82 %
Brady Statistic RA Percent Paced: 14 %
Brady Statistic RV Percent Paced: 3.1 %
Date Time Interrogation Session: 20230513032126
Implantable Lead Implant Date: 20130110
Implantable Lead Implant Date: 20130110
Implantable Lead Location: 753859
Implantable Lead Location: 753860
Implantable Pulse Generator Implant Date: 20130110
Lead Channel Impedance Value: 350 Ohm
Lead Channel Impedance Value: 410 Ohm
Lead Channel Pacing Threshold Amplitude: 0.5 V
Lead Channel Pacing Threshold Amplitude: 0.75 V
Lead Channel Pacing Threshold Pulse Width: 0.4 ms
Lead Channel Pacing Threshold Pulse Width: 0.4 ms
Lead Channel Sensing Intrinsic Amplitude: 12 mV
Lead Channel Sensing Intrinsic Amplitude: 2.5 mV
Lead Channel Setting Pacing Amplitude: 2 V
Lead Channel Setting Pacing Amplitude: 2.25 V
Lead Channel Setting Pacing Pulse Width: 0.4 ms
Lead Channel Setting Sensing Sensitivity: 4.5 mV
Pulse Gen Model: 2210
Pulse Gen Serial Number: 7303108

## 2021-09-02 NOTE — Addendum Note (Signed)
Addended by: Cheri Kearns A on: 09/02/2021 11:05 AM   Modules accepted: Level of Service

## 2021-09-02 NOTE — Progress Notes (Signed)
Remote pacemaker transmission.   

## 2021-09-12 DIAGNOSIS — I4821 Permanent atrial fibrillation: Secondary | ICD-10-CM | POA: Diagnosis not present

## 2021-09-12 DIAGNOSIS — I951 Orthostatic hypotension: Secondary | ICD-10-CM | POA: Diagnosis not present

## 2021-09-12 DIAGNOSIS — D6869 Other thrombophilia: Secondary | ICD-10-CM | POA: Diagnosis not present

## 2021-09-12 DIAGNOSIS — M810 Age-related osteoporosis without current pathological fracture: Secondary | ICD-10-CM | POA: Diagnosis not present

## 2021-09-12 DIAGNOSIS — I700 Atherosclerosis of aorta: Secondary | ICD-10-CM | POA: Diagnosis not present

## 2021-09-12 DIAGNOSIS — D7589 Other specified diseases of blood and blood-forming organs: Secondary | ICD-10-CM | POA: Diagnosis not present

## 2021-09-12 DIAGNOSIS — E7800 Pure hypercholesterolemia, unspecified: Secondary | ICD-10-CM | POA: Diagnosis not present

## 2021-09-12 DIAGNOSIS — Z7901 Long term (current) use of anticoagulants: Secondary | ICD-10-CM | POA: Diagnosis not present

## 2021-09-12 DIAGNOSIS — K2950 Unspecified chronic gastritis without bleeding: Secondary | ICD-10-CM | POA: Diagnosis not present

## 2021-09-12 DIAGNOSIS — I1310 Hypertensive heart and chronic kidney disease without heart failure, with stage 1 through stage 4 chronic kidney disease, or unspecified chronic kidney disease: Secondary | ICD-10-CM | POA: Diagnosis not present

## 2021-09-12 DIAGNOSIS — R300 Dysuria: Secondary | ICD-10-CM | POA: Diagnosis not present

## 2021-09-12 DIAGNOSIS — R413 Other amnesia: Secondary | ICD-10-CM | POA: Diagnosis not present

## 2021-09-12 DIAGNOSIS — N1831 Chronic kidney disease, stage 3a: Secondary | ICD-10-CM | POA: Diagnosis not present

## 2021-09-14 LAB — CUP PACEART REMOTE DEVICE CHECK
Battery Remaining Longevity: 5 mo
Battery Remaining Percentage: 4 %
Battery Voltage: 2.72 V
Brady Statistic AP VP Percent: 1 %
Brady Statistic AP VS Percent: 14 %
Brady Statistic AS VP Percent: 2.3 %
Brady Statistic AS VS Percent: 82 %
Brady Statistic RA Percent Paced: 14 %
Brady Statistic RV Percent Paced: 3.3 %
Date Time Interrogation Session: 20230613033634
Implantable Lead Implant Date: 20130110
Implantable Lead Implant Date: 20130110
Implantable Lead Location: 753859
Implantable Lead Location: 753860
Implantable Pulse Generator Implant Date: 20130110
Lead Channel Impedance Value: 330 Ohm
Lead Channel Impedance Value: 430 Ohm
Lead Channel Pacing Threshold Amplitude: 0.5 V
Lead Channel Pacing Threshold Amplitude: 0.75 V
Lead Channel Pacing Threshold Pulse Width: 0.4 ms
Lead Channel Pacing Threshold Pulse Width: 0.4 ms
Lead Channel Sensing Intrinsic Amplitude: 1.6 mV
Lead Channel Sensing Intrinsic Amplitude: 12 mV
Lead Channel Setting Pacing Amplitude: 2 V
Lead Channel Setting Pacing Amplitude: 2.25 V
Lead Channel Setting Pacing Pulse Width: 0.4 ms
Lead Channel Setting Sensing Sensitivity: 4.5 mV
Pulse Gen Model: 2210
Pulse Gen Serial Number: 7303108

## 2021-09-15 ENCOUNTER — Ambulatory Visit (INDEPENDENT_AMBULATORY_CARE_PROVIDER_SITE_OTHER): Payer: Medicare Other

## 2021-09-15 DIAGNOSIS — I442 Atrioventricular block, complete: Secondary | ICD-10-CM

## 2021-10-17 ENCOUNTER — Ambulatory Visit (INDEPENDENT_AMBULATORY_CARE_PROVIDER_SITE_OTHER): Payer: Medicare Other

## 2021-10-17 DIAGNOSIS — I442 Atrioventricular block, complete: Secondary | ICD-10-CM

## 2021-10-18 LAB — CUP PACEART REMOTE DEVICE CHECK
Battery Remaining Longevity: 5 mo
Battery Remaining Percentage: 4 %
Battery Voltage: 2.72 V
Brady Statistic AP VP Percent: 1 %
Brady Statistic AP VS Percent: 14 %
Brady Statistic AS VP Percent: 2.4 %
Brady Statistic AS VS Percent: 82 %
Brady Statistic RA Percent Paced: 13 %
Brady Statistic RV Percent Paced: 3.4 %
Date Time Interrogation Session: 20230714034601
Implantable Lead Implant Date: 20130110
Implantable Lead Implant Date: 20130110
Implantable Lead Location: 753859
Implantable Lead Location: 753860
Implantable Pulse Generator Implant Date: 20130110
Lead Channel Impedance Value: 310 Ohm
Lead Channel Impedance Value: 400 Ohm
Lead Channel Pacing Threshold Amplitude: 0.5 V
Lead Channel Pacing Threshold Amplitude: 0.75 V
Lead Channel Pacing Threshold Pulse Width: 0.4 ms
Lead Channel Pacing Threshold Pulse Width: 0.4 ms
Lead Channel Sensing Intrinsic Amplitude: 1 mV
Lead Channel Sensing Intrinsic Amplitude: 12 mV
Lead Channel Setting Pacing Amplitude: 2 V
Lead Channel Setting Pacing Amplitude: 2.25 V
Lead Channel Setting Pacing Pulse Width: 0.4 ms
Lead Channel Setting Sensing Sensitivity: 4.5 mV
Pulse Gen Model: 2210
Pulse Gen Serial Number: 7303108

## 2021-10-29 ENCOUNTER — Encounter: Payer: Self-pay | Admitting: Internal Medicine

## 2021-11-11 ENCOUNTER — Ambulatory Visit (INDEPENDENT_AMBULATORY_CARE_PROVIDER_SITE_OTHER): Payer: Medicare Other | Admitting: Physician Assistant

## 2021-11-11 ENCOUNTER — Encounter: Payer: Self-pay | Admitting: Physician Assistant

## 2021-11-11 VITALS — BP 148/60 | HR 82 | Ht 63.0 in | Wt 118.0 lb

## 2021-11-11 DIAGNOSIS — Z950 Presence of cardiac pacemaker: Secondary | ICD-10-CM

## 2021-11-11 DIAGNOSIS — I480 Paroxysmal atrial fibrillation: Secondary | ICD-10-CM

## 2021-11-11 DIAGNOSIS — I471 Supraventricular tachycardia: Secondary | ICD-10-CM

## 2021-11-11 DIAGNOSIS — I10 Essential (primary) hypertension: Secondary | ICD-10-CM | POA: Diagnosis not present

## 2021-11-11 DIAGNOSIS — I442 Atrioventricular block, complete: Secondary | ICD-10-CM

## 2021-11-11 LAB — CUP PACEART INCLINIC DEVICE CHECK
Battery Remaining Longevity: 5 mo
Battery Voltage: 2.71 V
Brady Statistic RA Percent Paced: 13 %
Brady Statistic RV Percent Paced: 3.4 %
Date Time Interrogation Session: 20230811132545
Implantable Lead Implant Date: 20130110
Implantable Lead Implant Date: 20130110
Implantable Lead Location: 753859
Implantable Lead Location: 753860
Implantable Pulse Generator Implant Date: 20130110
Lead Channel Impedance Value: 350 Ohm
Lead Channel Impedance Value: 362.5 Ohm
Lead Channel Pacing Threshold Amplitude: 0.5 V
Lead Channel Pacing Threshold Amplitude: 0.5 V
Lead Channel Pacing Threshold Amplitude: 1 V
Lead Channel Pacing Threshold Amplitude: 1 V
Lead Channel Pacing Threshold Pulse Width: 0.4 ms
Lead Channel Pacing Threshold Pulse Width: 0.4 ms
Lead Channel Pacing Threshold Pulse Width: 0.4 ms
Lead Channel Pacing Threshold Pulse Width: 0.4 ms
Lead Channel Sensing Intrinsic Amplitude: 12 mV
Lead Channel Sensing Intrinsic Amplitude: 3 mV
Lead Channel Setting Pacing Amplitude: 2 V
Lead Channel Setting Pacing Amplitude: 2.25 V
Lead Channel Setting Pacing Pulse Width: 0.4 ms
Lead Channel Setting Sensing Sensitivity: 4.5 mV
Pulse Gen Model: 2210
Pulse Gen Serial Number: 7303108

## 2021-11-11 NOTE — Progress Notes (Signed)
Cardiology Office Note Date:  11/11/2021  Patient ID:  Joy, Newman May 17, 1927, MRN [id number] PCP:  Haywood Pao, MD  Electrophysiologist: Dr. Caryl Comes    Chief Complaint: 10 mo, nearing ERI  History of Present Illness: Joy Newman is a 88 y.o. female with history of carotid hypersensitivity, intermittent CHB w/ syncope > PPM, CAD (remote ischemic disease/MI, problem list report cath with no obstructive disease), HTN, HLD, Afib, Atach. AV malformation colon, dementia (follows with neurology)  She comes in today to be seen for Dr. Caryl Comes, last seen by him Aug 2022, doing well at that time,  He discussed some A lead noise and false Afib episodes, but known PAFib and maintained on Eliquis (programmed sensing to exclude ring electrode)  TODAY She is accompanied by a close family friend and caregiver. She carries hx of dementia, but does well, has day-time help. She reports doing well No CP, palpitations, no SOB No near syncope or syncope. No bleeding or signs of bleeding  She is worried about the pending pacer gen change procedure Has labs with her PMD   Device information Abbott dual chamber PPM implanted 04/13/2011   Past Medical History:  Diagnosis Date   Arteriovenous malformation of colon    Basal cell carcinoma    CAD (coronary artery disease) no obstruction at cath    Hx of with an anteroapical M.I   Carotid sinus hypersensitivity    Complete heart block-intermittent    Diastolic dysfunction    Diverticulosis    Fall 05/2018   Heart disease    Hyperlipidemia    Hypertension    Kidney stones    Melanoma ([place])    Memory difficulty 02/25/2020   MVP (mitral valve prolapse)    Myocardial infarction [facility])    2005   Orthostasis    Pacemaker    St Jude   Pulmonary hypertension (HCC)     Hx of Mild   PVC and PACs    Squamous carcinoma    Syncope    Tricuspid regurgitation    Moderate regurgitation   Tubular adenoma of colon 02/2012     Past Surgical History:  Procedure Laterality Date   ABDOMINAL HYSTERECTOMY  1980   Hx of   APPENDECTOMY  1954   Hx of   CARDIAC CATHETERIZATION     Revealed an apical wall motion abnormality   CARDIOVASCULAR STRESS TEST  02-21-2006   EF 84%, which revealed an apical defect   CATARACT EXTRACTION Bilateral    NASAL RECONSTRUCTION  1981   PACEMAKER INSERTION     PERMANENT PACEMAKER INSERTION N/A 04/13/2011   Procedure: PERMANENT PACEMAKER INSERTION;  Surgeon: Evans Lance, MD;  Location: [facility] CATH LAB;  Service: Cardiovascular;  Laterality: N/A;   SKIN CANCER EXCISION     several, basal cell, squamous, melanoma   TONSILLECTOMY  1971   Hx of    Current Outpatient Medications  Medication Sig Dispense Refill   alendronate (FOSAMAX) 70 MG tablet Take 70 mg by mouth once a week.     apixaban (ELIQUIS) 2.5 MG TABS tablet Take 1 tablet (2.5 mg total) by mouth 2 (two) times daily. Re-tart this medicine on 11/27/14 60 tablet 11   buPROPion (WELLBUTRIN) 100 MG tablet Take 100 mg by mouth daily. 1/2 am, 1/2 pm     Cholecalciferol 50 MCG (2000 UT) TABS Take 1 tablet by mouth daily.     diltiazem (CARDIZEM CD) 180 MG 24 hr capsule Take 1 capsule (180 mg total) by  mouth every morning.     Multiple Vitamin (MULTIVITAMIN WITH MINERALS) TABS tablet Take 1 tablet by mouth daily.     pantoprazole (PROTONIX) 40 MG tablet Take 40 mg by mouth daily.      rosuvastatin (CRESTOR) 5 MG tablet Take 5 mg by mouth daily.     sertraline (ZOLOFT) 25 MG tablet Take 25 mg by mouth in the morning and at bedtime.     No current facility-administered medications for this visit.    Allergies:   Aricept [donepezil], Imdur [isosorbide mononitrate], Isosorbide nitrate, Polysporin [bacitracin-polymyxin b], Quinine derivatives, and Tetracyclines & related   Social History:  The patient  reports that she has never smoked. She has never used smokeless tobacco. She reports current alcohol use. She reports that she does  not use drugs.   Family History:  The patient's family history includes Heart disease in her brother; Kidney disease in her father and sister; Liver cancer in her sister; Lung cancer in her brother; Stroke in her brother and mother.  ROS:  Please see the history of present illness.    All other systems are reviewed and otherwise negative.   PHYSICAL EXAM:  VS:  BP (!) 148/60   Pulse 82   Ht '5\' 3"'$  (1.6 m)   Wt 118 lb (53.5 kg)   SpO2 98%   BMI 20.90 kg/m  BMI: Body mass index is 20.9 kg/m. Well nourished, well developed, in no acute distress HEENT: normocephalic, atraumatic Neck: no JVD, carotid bruits or masses Cardiac:  RRR; no significant murmurs, no rubs, or gallops Lungs:  CTA b/l, no wheezing, rhonchi or rales Abd: soft, nontender MS: no deformity, age appropriate/perhaps advanced atrophy Ext: no edema Skin: warm and dry, no rash Neuro:  No gross deficits appreciated Psych: euthymic mood, full affect  PPM site is stable, no tethering or discomfort   EKG:  Done today and reviewed by myself shows  Ectopic/low atrial rhythm, 82bpm  Device interrogation done today and reviewed by myself:  Battery is 4.63moto ERI Lead measurements are stable AMS episodes reviewed All available EGMs to review are noise, no true arrhythmia There is a note of RV lead noise though I can not find it, none noted today   05/15/2018: TTE  1. The left ventricle has hyperdynamic systolic function of >>16% The  cavity size was normal. Left ventricular diastolic Doppler parameters are  consistent with pseudonormalization No evidence of left ventricular  regional wall motion abnormalities.   2. No evidence of left ventricular regional wall motion abnormalities.   3. The right ventricle has normal systolic function. The cavity was  normal. There is no increase in right ventricular wall thickness.   4. Left atrial size was moderately dilated.   5. Right atrial size was mildly dilated.   6. The  interatrial septum was not assessed.   7. The mitral valve is normal in structure.   8. The tricuspid valve is normal in structure.   9. The aortic valve is normal in structure. Aortic valve regurgitation is  mild by color flow Doppler.  10. The aortic root is normal in size and structure.  11. Right atrial pressure is estimated at 3 mmHg.   Recent Labs: No results found for requested labs within last 365 days.  No results found for requested labs within last 365 days.   CrCl cannot be calculated (Patient's most recent lab result is older than the maximum 21 days allowed.).   Wt Readings from Last 3  Encounters:  11/11/21 118 lb (53.5 kg)  03/30/21 110 lb (49.9 kg)  11/30/20 110 lb (49.9 kg)     Other studies reviewed: Additional studies/records reviewed today include: summarized above  ASSESSMENT AND PLAN:  PPM Stable function Known A lead noise Nearing ERI  Discussed gen change procedure, she is much relieved   Paroxysmal Afib CHA2DS2Vasc is 5, on Eliquis, appropriately dosed No true AF by review of available EGMs   ATach None noted  HTN No changes today   Disposition: F/u with [place] in 28mo sooner if needed  Current medicines are reviewed at length with the patient today.  The patient did not have any concerns regarding medicines.  SVenetia Night PA-C 11/11/2021 11:37 AM     [facility] [address] [place] [phone](office)  [phone](fax)

## 2021-11-11 NOTE — Patient Instructions (Signed)
Medication Instructions:   Your physician recommends that you continue on your current medications as directed. Please refer to the Current Medication list given to you today.   *If you need a refill on your cardiac medications before your next appointment, please call your pharmacy*   Lab Work: [place]   If you have labs (blood work) drawn today and your tests are completely normal, you will receive your results only by: [place] (if you have MyChart) OR A paper copy in the mail If you have any lab test that is abnormal or we need to change your treatment, we will call you to review the results.   Testing/Procedures: NONE ORDERED  TODAY    Follow-Up: At [facility], you and your health needs are our priority.  As part of our continuing mission to provide you with exceptional heart care, we have created designated Provider Care Teams.  These Care Teams include your primary Cardiologist (physician) and Advanced Practice Providers (APPs -  Physician Assistants and Nurse Practitioners) who all work together to provide you with the care you need, when you need it.  We recommend signing up for the patient portal called "MyChart".  Sign up information is provided on this After Visit Summary.  MyChart is used to connect with patients for Virtual Visits (Telemedicine).  Patients are able to view lab/test results, encounter notes, upcoming appointments, etc.  Non-urgent messages can be sent to your provider as well.   To learn more about what you can do with MyChart, go to [email or web address].    Your next appointment:   4 month(s)  The format for your next appointment:   In Person  Provider:   You may see Dr. Caryl Comes  or one of the following Advanced Practice Providers on your designated Care Team:   Tommye Standard, PA-C   Other Instructions   Important Information About Sugar

## 2021-11-17 ENCOUNTER — Ambulatory Visit: Payer: Medicare Other

## 2021-11-17 DIAGNOSIS — Z950 Presence of cardiac pacemaker: Secondary | ICD-10-CM

## 2021-11-18 LAB — CUP PACEART REMOTE DEVICE CHECK
Battery Remaining Longevity: 4 mo
Battery Remaining Percentage: 3 %
Battery Voltage: 2.71 V
Brady Statistic AP VP Percent: 1 %
Brady Statistic AP VS Percent: 7.1 %
Brady Statistic AS VP Percent: 2.5 %
Brady Statistic AS VS Percent: 89 %
Brady Statistic RA Percent Paced: 6.8 %
Brady Statistic RV Percent Paced: 3.3 %
Date Time Interrogation Session: 20230814035651
Implantable Lead Implant Date: 20130110
Implantable Lead Implant Date: 20130110
Implantable Lead Location: 753859
Implantable Lead Location: 753860
Implantable Pulse Generator Implant Date: 20130110
Lead Channel Impedance Value: 360 Ohm
Lead Channel Impedance Value: 440 Ohm
Lead Channel Pacing Threshold Amplitude: 0.5 V
Lead Channel Pacing Threshold Amplitude: 1 V
Lead Channel Pacing Threshold Pulse Width: 0.4 ms
Lead Channel Pacing Threshold Pulse Width: 0.4 ms
Lead Channel Sensing Intrinsic Amplitude: 12 mV
Lead Channel Sensing Intrinsic Amplitude: 2.7 mV
Lead Channel Setting Pacing Amplitude: 2 V
Lead Channel Setting Pacing Amplitude: 2.25 V
Lead Channel Setting Pacing Pulse Width: 0.4 ms
Lead Channel Setting Sensing Sensitivity: 4.5 mV
Pulse Gen Model: 2210
Pulse Gen Serial Number: 7303108

## 2021-11-18 NOTE — Progress Notes (Signed)
Remote pacemaker transmission.   

## 2021-11-18 NOTE — Addendum Note (Signed)
Addended by: Douglass Rivers D on: 11/18/2021 10:41 AM   Modules accepted: Level of Service

## 2021-12-15 NOTE — Addendum Note (Signed)
Addended by: Cheri Kearns A on: 12/15/2021 08:41 AM   Modules accepted: Level of Service

## 2021-12-15 NOTE — Progress Notes (Signed)
Remote pacemaker transmission.   

## 2021-12-18 LAB — CUP PACEART REMOTE DEVICE CHECK
Battery Remaining Longevity: 4 mo
Battery Remaining Percentage: 3 %
Battery Voltage: 2.69 V
Brady Statistic AP VP Percent: 1 %
Brady Statistic AP VS Percent: 7 %
Brady Statistic AS VP Percent: 2 %
Brady Statistic AS VS Percent: 90 %
Brady Statistic RA Percent Paced: 6.3 %
Brady Statistic RV Percent Paced: 2.4 %
Date Time Interrogation Session: 20230914020743
Implantable Lead Implant Date: 20130110
Implantable Lead Implant Date: 20130110
Implantable Lead Location: 753859
Implantable Lead Location: 753860
Implantable Pulse Generator Implant Date: 20130110
Lead Channel Impedance Value: 350 Ohm
Lead Channel Impedance Value: 430 Ohm
Lead Channel Pacing Threshold Amplitude: 0.5 V
Lead Channel Pacing Threshold Amplitude: 1 V
Lead Channel Pacing Threshold Pulse Width: 0.4 ms
Lead Channel Pacing Threshold Pulse Width: 0.4 ms
Lead Channel Sensing Intrinsic Amplitude: 12 mV
Lead Channel Sensing Intrinsic Amplitude: 3.1 mV
Lead Channel Setting Pacing Amplitude: 2 V
Lead Channel Setting Pacing Amplitude: 2.25 V
Lead Channel Setting Pacing Pulse Width: 0.4 ms
Lead Channel Setting Sensing Sensitivity: 4.5 mV
Pulse Gen Model: 2210
Pulse Gen Serial Number: 7303108

## 2021-12-19 ENCOUNTER — Ambulatory Visit (INDEPENDENT_AMBULATORY_CARE_PROVIDER_SITE_OTHER): Payer: Medicare Other

## 2021-12-19 DIAGNOSIS — I442 Atrioventricular block, complete: Secondary | ICD-10-CM

## 2021-12-31 NOTE — Progress Notes (Signed)
Remote pacemaker transmission.   

## 2022-01-02 DIAGNOSIS — M5136 Other intervertebral disc degeneration, lumbar region: Secondary | ICD-10-CM | POA: Diagnosis not present

## 2022-01-02 DIAGNOSIS — F0390 Unspecified dementia without behavioral disturbance: Secondary | ICD-10-CM | POA: Diagnosis not present

## 2022-01-02 DIAGNOSIS — Z7901 Long term (current) use of anticoagulants: Secondary | ICD-10-CM | POA: Diagnosis not present

## 2022-01-02 DIAGNOSIS — Z9089 Acquired absence of other organs: Secondary | ICD-10-CM | POA: Diagnosis not present

## 2022-01-02 DIAGNOSIS — Z9181 History of falling: Secondary | ICD-10-CM | POA: Diagnosis not present

## 2022-01-02 DIAGNOSIS — Z950 Presence of cardiac pacemaker: Secondary | ICD-10-CM | POA: Diagnosis not present

## 2022-01-02 DIAGNOSIS — M1990 Unspecified osteoarthritis, unspecified site: Secondary | ICD-10-CM | POA: Diagnosis not present

## 2022-01-09 DIAGNOSIS — M5136 Other intervertebral disc degeneration, lumbar region: Secondary | ICD-10-CM | POA: Diagnosis not present

## 2022-01-09 DIAGNOSIS — M1990 Unspecified osteoarthritis, unspecified site: Secondary | ICD-10-CM | POA: Diagnosis not present

## 2022-01-09 DIAGNOSIS — Z7901 Long term (current) use of anticoagulants: Secondary | ICD-10-CM | POA: Diagnosis not present

## 2022-01-09 DIAGNOSIS — Z950 Presence of cardiac pacemaker: Secondary | ICD-10-CM | POA: Diagnosis not present

## 2022-01-09 DIAGNOSIS — F0390 Unspecified dementia without behavioral disturbance: Secondary | ICD-10-CM | POA: Diagnosis not present

## 2022-01-09 DIAGNOSIS — Z9089 Acquired absence of other organs: Secondary | ICD-10-CM | POA: Diagnosis not present

## 2022-01-12 DIAGNOSIS — M1990 Unspecified osteoarthritis, unspecified site: Secondary | ICD-10-CM | POA: Diagnosis not present

## 2022-01-12 DIAGNOSIS — F0390 Unspecified dementia without behavioral disturbance: Secondary | ICD-10-CM | POA: Diagnosis not present

## 2022-01-12 DIAGNOSIS — M5136 Other intervertebral disc degeneration, lumbar region: Secondary | ICD-10-CM | POA: Diagnosis not present

## 2022-01-12 DIAGNOSIS — Z7901 Long term (current) use of anticoagulants: Secondary | ICD-10-CM | POA: Diagnosis not present

## 2022-01-12 DIAGNOSIS — Z950 Presence of cardiac pacemaker: Secondary | ICD-10-CM | POA: Diagnosis not present

## 2022-01-12 DIAGNOSIS — Z9089 Acquired absence of other organs: Secondary | ICD-10-CM | POA: Diagnosis not present

## 2022-01-16 DIAGNOSIS — Z950 Presence of cardiac pacemaker: Secondary | ICD-10-CM | POA: Diagnosis not present

## 2022-01-16 DIAGNOSIS — M5136 Other intervertebral disc degeneration, lumbar region: Secondary | ICD-10-CM | POA: Diagnosis not present

## 2022-01-16 DIAGNOSIS — M1990 Unspecified osteoarthritis, unspecified site: Secondary | ICD-10-CM | POA: Diagnosis not present

## 2022-01-16 DIAGNOSIS — Z7901 Long term (current) use of anticoagulants: Secondary | ICD-10-CM | POA: Diagnosis not present

## 2022-01-16 DIAGNOSIS — Z9089 Acquired absence of other organs: Secondary | ICD-10-CM | POA: Diagnosis not present

## 2022-01-16 DIAGNOSIS — F0390 Unspecified dementia without behavioral disturbance: Secondary | ICD-10-CM | POA: Diagnosis not present

## 2022-01-19 ENCOUNTER — Ambulatory Visit (INDEPENDENT_AMBULATORY_CARE_PROVIDER_SITE_OTHER): Payer: Medicare Other

## 2022-01-19 DIAGNOSIS — M1990 Unspecified osteoarthritis, unspecified site: Secondary | ICD-10-CM | POA: Diagnosis not present

## 2022-01-19 DIAGNOSIS — M5136 Other intervertebral disc degeneration, lumbar region: Secondary | ICD-10-CM | POA: Diagnosis not present

## 2022-01-19 DIAGNOSIS — F0390 Unspecified dementia without behavioral disturbance: Secondary | ICD-10-CM | POA: Diagnosis not present

## 2022-01-19 DIAGNOSIS — Z950 Presence of cardiac pacemaker: Secondary | ICD-10-CM | POA: Diagnosis not present

## 2022-01-19 DIAGNOSIS — Z7901 Long term (current) use of anticoagulants: Secondary | ICD-10-CM | POA: Diagnosis not present

## 2022-01-19 DIAGNOSIS — Z9089 Acquired absence of other organs: Secondary | ICD-10-CM | POA: Diagnosis not present

## 2022-01-19 DIAGNOSIS — I442 Atrioventricular block, complete: Secondary | ICD-10-CM

## 2022-01-21 ENCOUNTER — Encounter: Payer: Self-pay | Admitting: Neurology

## 2022-01-24 DIAGNOSIS — Z950 Presence of cardiac pacemaker: Secondary | ICD-10-CM | POA: Diagnosis not present

## 2022-01-24 DIAGNOSIS — M1990 Unspecified osteoarthritis, unspecified site: Secondary | ICD-10-CM | POA: Diagnosis not present

## 2022-01-24 DIAGNOSIS — Z9089 Acquired absence of other organs: Secondary | ICD-10-CM | POA: Diagnosis not present

## 2022-01-24 DIAGNOSIS — Z7901 Long term (current) use of anticoagulants: Secondary | ICD-10-CM | POA: Diagnosis not present

## 2022-01-24 DIAGNOSIS — F0390 Unspecified dementia without behavioral disturbance: Secondary | ICD-10-CM | POA: Diagnosis not present

## 2022-01-24 DIAGNOSIS — M5136 Other intervertebral disc degeneration, lumbar region: Secondary | ICD-10-CM | POA: Diagnosis not present

## 2022-01-26 DIAGNOSIS — Z950 Presence of cardiac pacemaker: Secondary | ICD-10-CM | POA: Diagnosis not present

## 2022-01-26 DIAGNOSIS — Z7901 Long term (current) use of anticoagulants: Secondary | ICD-10-CM | POA: Diagnosis not present

## 2022-01-26 DIAGNOSIS — F0390 Unspecified dementia without behavioral disturbance: Secondary | ICD-10-CM | POA: Diagnosis not present

## 2022-01-26 DIAGNOSIS — M5136 Other intervertebral disc degeneration, lumbar region: Secondary | ICD-10-CM | POA: Diagnosis not present

## 2022-01-26 DIAGNOSIS — Z9089 Acquired absence of other organs: Secondary | ICD-10-CM | POA: Diagnosis not present

## 2022-01-26 DIAGNOSIS — M1990 Unspecified osteoarthritis, unspecified site: Secondary | ICD-10-CM | POA: Diagnosis not present

## 2022-01-27 LAB — CUP PACEART REMOTE DEVICE CHECK
Battery Remaining Longevity: 3 mo
Battery Remaining Percentage: 2 %
Battery Voltage: 2.68 V
Brady Statistic AP VP Percent: 1 %
Brady Statistic AP VS Percent: 8.7 %
Brady Statistic AS VP Percent: 1.9 %
Brady Statistic AS VS Percent: 88 %
Brady Statistic RA Percent Paced: 8 %
Brady Statistic RV Percent Paced: 2.3 %
Date Time Interrogation Session: 20231015021500
Implantable Lead Connection Status: 753985
Implantable Lead Connection Status: 753985
Implantable Lead Implant Date: 20130110
Implantable Lead Implant Date: 20130110
Implantable Lead Location: 753859
Implantable Lead Location: 753860
Implantable Pulse Generator Implant Date: 20130110
Lead Channel Impedance Value: 340 Ohm
Lead Channel Impedance Value: 400 Ohm
Lead Channel Pacing Threshold Amplitude: 0.5 V
Lead Channel Pacing Threshold Amplitude: 1 V
Lead Channel Pacing Threshold Pulse Width: 0.4 ms
Lead Channel Pacing Threshold Pulse Width: 0.4 ms
Lead Channel Sensing Intrinsic Amplitude: 0.9 mV
Lead Channel Sensing Intrinsic Amplitude: 12 mV
Lead Channel Setting Pacing Amplitude: 2 V
Lead Channel Setting Pacing Amplitude: 2.25 V
Lead Channel Setting Pacing Pulse Width: 0.4 ms
Lead Channel Setting Sensing Sensitivity: 4.5 mV
Pulse Gen Model: 2210
Pulse Gen Serial Number: 7303108

## 2022-01-27 NOTE — Progress Notes (Signed)
Remote pacemaker transmission.   

## 2022-01-27 NOTE — Addendum Note (Signed)
Addended by: Cheri Kearns A on: 01/27/2022 11:46 AM   Modules accepted: Level of Service

## 2022-01-30 DIAGNOSIS — Z950 Presence of cardiac pacemaker: Secondary | ICD-10-CM | POA: Diagnosis not present

## 2022-01-30 DIAGNOSIS — F0390 Unspecified dementia without behavioral disturbance: Secondary | ICD-10-CM | POA: Diagnosis not present

## 2022-01-30 DIAGNOSIS — M1990 Unspecified osteoarthritis, unspecified site: Secondary | ICD-10-CM | POA: Diagnosis not present

## 2022-01-30 DIAGNOSIS — Z9089 Acquired absence of other organs: Secondary | ICD-10-CM | POA: Diagnosis not present

## 2022-01-30 DIAGNOSIS — Z7901 Long term (current) use of anticoagulants: Secondary | ICD-10-CM | POA: Diagnosis not present

## 2022-01-30 DIAGNOSIS — M5136 Other intervertebral disc degeneration, lumbar region: Secondary | ICD-10-CM | POA: Diagnosis not present

## 2022-01-31 ENCOUNTER — Encounter: Payer: Self-pay | Admitting: Internal Medicine

## 2022-02-01 DIAGNOSIS — M5136 Other intervertebral disc degeneration, lumbar region: Secondary | ICD-10-CM | POA: Diagnosis not present

## 2022-02-01 DIAGNOSIS — Z950 Presence of cardiac pacemaker: Secondary | ICD-10-CM | POA: Diagnosis not present

## 2022-02-01 DIAGNOSIS — Z9181 History of falling: Secondary | ICD-10-CM | POA: Diagnosis not present

## 2022-02-01 DIAGNOSIS — Z7901 Long term (current) use of anticoagulants: Secondary | ICD-10-CM | POA: Diagnosis not present

## 2022-02-01 DIAGNOSIS — Z9089 Acquired absence of other organs: Secondary | ICD-10-CM | POA: Diagnosis not present

## 2022-02-01 DIAGNOSIS — F0390 Unspecified dementia without behavioral disturbance: Secondary | ICD-10-CM | POA: Diagnosis not present

## 2022-02-01 DIAGNOSIS — M1990 Unspecified osteoarthritis, unspecified site: Secondary | ICD-10-CM | POA: Diagnosis not present

## 2022-02-09 DIAGNOSIS — Z9089 Acquired absence of other organs: Secondary | ICD-10-CM | POA: Diagnosis not present

## 2022-02-09 DIAGNOSIS — Z7901 Long term (current) use of anticoagulants: Secondary | ICD-10-CM | POA: Diagnosis not present

## 2022-02-09 DIAGNOSIS — M1990 Unspecified osteoarthritis, unspecified site: Secondary | ICD-10-CM | POA: Diagnosis not present

## 2022-02-09 DIAGNOSIS — Z950 Presence of cardiac pacemaker: Secondary | ICD-10-CM | POA: Diagnosis not present

## 2022-02-09 DIAGNOSIS — F0390 Unspecified dementia without behavioral disturbance: Secondary | ICD-10-CM | POA: Diagnosis not present

## 2022-02-09 DIAGNOSIS — M5136 Other intervertebral disc degeneration, lumbar region: Secondary | ICD-10-CM | POA: Diagnosis not present

## 2022-02-14 DIAGNOSIS — M1990 Unspecified osteoarthritis, unspecified site: Secondary | ICD-10-CM | POA: Diagnosis not present

## 2022-02-14 DIAGNOSIS — Z7901 Long term (current) use of anticoagulants: Secondary | ICD-10-CM | POA: Diagnosis not present

## 2022-02-14 DIAGNOSIS — M5136 Other intervertebral disc degeneration, lumbar region: Secondary | ICD-10-CM | POA: Diagnosis not present

## 2022-02-14 DIAGNOSIS — Z950 Presence of cardiac pacemaker: Secondary | ICD-10-CM | POA: Diagnosis not present

## 2022-02-14 DIAGNOSIS — F0390 Unspecified dementia without behavioral disturbance: Secondary | ICD-10-CM | POA: Diagnosis not present

## 2022-02-14 DIAGNOSIS — Z9089 Acquired absence of other organs: Secondary | ICD-10-CM | POA: Diagnosis not present

## 2022-02-20 ENCOUNTER — Ambulatory Visit (INDEPENDENT_AMBULATORY_CARE_PROVIDER_SITE_OTHER): Payer: Medicare Other

## 2022-02-20 DIAGNOSIS — I442 Atrioventricular block, complete: Secondary | ICD-10-CM

## 2022-02-21 DIAGNOSIS — M5136 Other intervertebral disc degeneration, lumbar region: Secondary | ICD-10-CM | POA: Diagnosis not present

## 2022-02-21 DIAGNOSIS — F0390 Unspecified dementia without behavioral disturbance: Secondary | ICD-10-CM | POA: Diagnosis not present

## 2022-02-21 DIAGNOSIS — Z9089 Acquired absence of other organs: Secondary | ICD-10-CM | POA: Diagnosis not present

## 2022-02-21 DIAGNOSIS — Z7901 Long term (current) use of anticoagulants: Secondary | ICD-10-CM | POA: Diagnosis not present

## 2022-02-21 DIAGNOSIS — M1990 Unspecified osteoarthritis, unspecified site: Secondary | ICD-10-CM | POA: Diagnosis not present

## 2022-02-21 DIAGNOSIS — Z950 Presence of cardiac pacemaker: Secondary | ICD-10-CM | POA: Diagnosis not present

## 2022-02-21 LAB — CUP PACEART REMOTE DEVICE CHECK
Battery Remaining Longevity: 3 mo
Battery Remaining Percentage: 2 %
Battery Voltage: 2.68 V
Brady Statistic AP VP Percent: 1 %
Brady Statistic AP VS Percent: 8.3 %
Brady Statistic AS VP Percent: 2.1 %
Brady Statistic AS VS Percent: 89 %
Brady Statistic RA Percent Paced: 7.7 %
Brady Statistic RV Percent Paced: 2.5 %
Date Time Interrogation Session: 20231120033156
Implantable Lead Connection Status: 753985
Implantable Lead Connection Status: 753985
Implantable Lead Implant Date: 20130110
Implantable Lead Implant Date: 20130110
Implantable Lead Location: 753859
Implantable Lead Location: 753860
Implantable Pulse Generator Implant Date: 20130110
Lead Channel Impedance Value: 360 Ohm
Lead Channel Impedance Value: 430 Ohm
Lead Channel Pacing Threshold Amplitude: 0.5 V
Lead Channel Pacing Threshold Amplitude: 1 V
Lead Channel Pacing Threshold Pulse Width: 0.4 ms
Lead Channel Pacing Threshold Pulse Width: 0.4 ms
Lead Channel Sensing Intrinsic Amplitude: 12 mV
Lead Channel Sensing Intrinsic Amplitude: 2.3 mV
Lead Channel Setting Pacing Amplitude: 2 V
Lead Channel Setting Pacing Amplitude: 2.25 V
Lead Channel Setting Pacing Pulse Width: 0.4 ms
Lead Channel Setting Sensing Sensitivity: 4.5 mV
Pulse Gen Model: 2210
Pulse Gen Serial Number: 7303108

## 2022-03-01 DIAGNOSIS — M5136 Other intervertebral disc degeneration, lumbar region: Secondary | ICD-10-CM | POA: Diagnosis not present

## 2022-03-01 DIAGNOSIS — Z7901 Long term (current) use of anticoagulants: Secondary | ICD-10-CM | POA: Diagnosis not present

## 2022-03-01 DIAGNOSIS — M1990 Unspecified osteoarthritis, unspecified site: Secondary | ICD-10-CM | POA: Diagnosis not present

## 2022-03-01 DIAGNOSIS — Z950 Presence of cardiac pacemaker: Secondary | ICD-10-CM | POA: Diagnosis not present

## 2022-03-01 DIAGNOSIS — Z9089 Acquired absence of other organs: Secondary | ICD-10-CM | POA: Diagnosis not present

## 2022-03-01 DIAGNOSIS — F0390 Unspecified dementia without behavioral disturbance: Secondary | ICD-10-CM | POA: Diagnosis not present

## 2022-03-10 DIAGNOSIS — I700 Atherosclerosis of aorta: Secondary | ICD-10-CM | POA: Diagnosis not present

## 2022-03-10 DIAGNOSIS — E559 Vitamin D deficiency, unspecified: Secondary | ICD-10-CM | POA: Diagnosis not present

## 2022-03-10 DIAGNOSIS — R7989 Other specified abnormal findings of blood chemistry: Secondary | ICD-10-CM | POA: Diagnosis not present

## 2022-03-10 DIAGNOSIS — E7800 Pure hypercholesterolemia, unspecified: Secondary | ICD-10-CM | POA: Diagnosis not present

## 2022-03-16 DIAGNOSIS — Z7901 Long term (current) use of anticoagulants: Secondary | ICD-10-CM | POA: Diagnosis not present

## 2022-03-16 DIAGNOSIS — Z950 Presence of cardiac pacemaker: Secondary | ICD-10-CM | POA: Diagnosis not present

## 2022-03-16 DIAGNOSIS — Z1331 Encounter for screening for depression: Secondary | ICD-10-CM | POA: Diagnosis not present

## 2022-03-16 DIAGNOSIS — I4821 Permanent atrial fibrillation: Secondary | ICD-10-CM | POA: Diagnosis not present

## 2022-03-16 DIAGNOSIS — N1831 Chronic kidney disease, stage 3a: Secondary | ICD-10-CM | POA: Diagnosis not present

## 2022-03-16 DIAGNOSIS — I700 Atherosclerosis of aorta: Secondary | ICD-10-CM | POA: Diagnosis not present

## 2022-03-16 DIAGNOSIS — R413 Other amnesia: Secondary | ICD-10-CM | POA: Diagnosis not present

## 2022-03-16 DIAGNOSIS — Z1339 Encounter for screening examination for other mental health and behavioral disorders: Secondary | ICD-10-CM | POA: Diagnosis not present

## 2022-03-16 DIAGNOSIS — D6869 Other thrombophilia: Secondary | ICD-10-CM | POA: Diagnosis not present

## 2022-03-16 DIAGNOSIS — E559 Vitamin D deficiency, unspecified: Secondary | ICD-10-CM | POA: Diagnosis not present

## 2022-03-16 DIAGNOSIS — Z23 Encounter for immunization: Secondary | ICD-10-CM | POA: Diagnosis not present

## 2022-03-16 DIAGNOSIS — I341 Nonrheumatic mitral (valve) prolapse: Secondary | ICD-10-CM | POA: Diagnosis not present

## 2022-03-16 DIAGNOSIS — I1310 Hypertensive heart and chronic kidney disease without heart failure, with stage 1 through stage 4 chronic kidney disease, or unspecified chronic kidney disease: Secondary | ICD-10-CM | POA: Diagnosis not present

## 2022-03-16 DIAGNOSIS — I951 Orthostatic hypotension: Secondary | ICD-10-CM | POA: Diagnosis not present

## 2022-03-16 DIAGNOSIS — Z0000 Encounter for general adult medical examination without abnormal findings: Secondary | ICD-10-CM | POA: Diagnosis not present

## 2022-03-21 ENCOUNTER — Ambulatory Visit: Payer: Medicare Other | Admitting: Neurology

## 2022-03-22 ENCOUNTER — Encounter: Payer: Self-pay | Admitting: Internal Medicine

## 2022-03-22 ENCOUNTER — Ambulatory Visit: Payer: Medicare Other | Attending: Internal Medicine | Admitting: Internal Medicine

## 2022-03-22 VITALS — BP 110/76 | HR 76 | Ht 63.0 in | Wt 120.2 lb

## 2022-03-22 DIAGNOSIS — I480 Paroxysmal atrial fibrillation: Secondary | ICD-10-CM | POA: Insufficient documentation

## 2022-03-22 DIAGNOSIS — Z79899 Other long term (current) drug therapy: Secondary | ICD-10-CM | POA: Insufficient documentation

## 2022-03-22 DIAGNOSIS — I4719 Other supraventricular tachycardia: Secondary | ICD-10-CM | POA: Diagnosis not present

## 2022-03-22 DIAGNOSIS — I442 Atrioventricular block, complete: Secondary | ICD-10-CM | POA: Insufficient documentation

## 2022-03-22 DIAGNOSIS — Z950 Presence of cardiac pacemaker: Secondary | ICD-10-CM | POA: Diagnosis not present

## 2022-03-22 NOTE — Progress Notes (Signed)
Feeling better     Patient Care Team: Tisovec, Fransico Him, MD as PCP - General (Internal Medicine)   HPI  Joy Newman is a 88 y.o. female Seen in followup for pacemaker implantation January 2013 for syncope and intermittent complete heart block.  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 Remote history of myocardial infarction and ischemic heart disease.     She told me about her support of the [facility] in [place] received a reward for her effort; that is now complete  The patient denies chest pain, shortness of breath, nocturnal dyspnea, orthopnea or peripheral edema.  There have been no palpitations, lightheadedness or syncope.    She is not aware that we have met before.  Her power of attorney says she will unlikely to know tomorrow that she was here today DATE TEST EF   2/20 Echo  >65 %     Date Cr K Hgb  2/20 0.85 3.5 12.2  6/20 085 4.4   12/23  4.2      Past Medical History:  Diagnosis Date   Arteriovenous malformation of colon    Basal cell carcinoma    CAD (coronary artery disease) no obstruction at cath    Hx of with an anteroapical M.I   Carotid sinus hypersensitivity    Complete heart block-intermittent    Diastolic dysfunction    Diverticulosis    Fall 05/2018   Heart disease    Hyperlipidemia    Hypertension    Kidney stones    Melanoma ([place])    Memory difficulty 02/25/2020   MVP (mitral valve prolapse)    Myocardial infarction [facility])    2005   Orthostasis    Pacemaker    St Jude   Pulmonary hypertension (HCC)     Hx of Mild   PVC and PACs    Squamous carcinoma    Syncope    Tricuspid regurgitation    Moderate regurgitation   Tubular adenoma of colon 02/2012    Past Surgical History:  Procedure Laterality Date   ABDOMINAL HYSTERECTOMY  1980   Hx of   APPENDECTOMY   1954   Hx of   CARDIAC CATHETERIZATION     Revealed an apical wall motion abnormality   CARDIOVASCULAR STRESS TEST  02-21-2006   EF 84%, which revealed an apical defect   CATARACT EXTRACTION Bilateral    NASAL RECONSTRUCTION  1981   PACEMAKER INSERTION     PERMANENT PACEMAKER INSERTION N/A 04/13/2011   Procedure: PERMANENT PACEMAKER INSERTION;  Surgeon: Evans Lance, MD;  Location: [facility] CATH LAB;  Service: Cardiovascular;  Laterality: N/A;   SKIN CANCER EXCISION     several, basal cell, squamous, melanoma   TONSILLECTOMY  1971   Hx of    Current Outpatient Medications  Medication Sig Dispense Refill   alendronate (FOSAMAX) 70 MG tablet Take 70 mg by mouth once a week.     apixaban (ELIQUIS) 2.5 MG TABS tablet Take 1 tablet (2.5 mg total) by mouth 2 (two) times daily. Re-tart this medicine on 11/27/14 60 tablet 11   buPROPion (WELLBUTRIN) 100 MG tablet Take 100 mg by mouth daily. 1/2 am, 1/2 pm     Cholecalciferol 1.25 MG (50000 UT) TABS Take 1 tablet by mouth daily.     diltiazem (CARDIZEM CD) 180 MG 24 hr capsule Take 1 capsule (180 mg total) by mouth every morning.  Multiple Vitamin (MULTIVITAMIN WITH MINERALS) TABS tablet Take 1 tablet by mouth daily.     pantoprazole (PROTONIX) 40 MG tablet Take 40 mg by mouth daily.      rosuvastatin (CRESTOR) 5 MG tablet Take 5 mg by mouth daily.     sertraline (ZOLOFT) 50 MG tablet Take 50 mg by mouth daily. 50 mg in am 25 mg in pm     No current facility-administered medications for this visit.    Allergies  Allergen Reactions   Aricept [Donepezil]     Irritability, vivid dreams   Imdur [Isosorbide Mononitrate]     Intolerant and cause headaches and nausea   Isosorbide Nitrate Other (See Comments) and Nausea Only   Polysporin [Bacitracin-Polymyxin B]     Rash   Quinine Derivatives     Increase in Heart Rate and Decrease in BP    Tetracyclines & Related     Rash    Review of Systems negative except from HPI and PMH  Physical  Exam BP 110/76   Pulse 76   Ht _0  (1.6 m)   Wt 120 lb 3.2 oz (54.5 kg)   SpO2 97%   BMI 21.29 kg/m  Well developed and well nourished in no acute distress HENT normal Neck supple with JVP-flat Clear Device pocket well healed; without hematoma or erythema.  There is no tethering  Regular rate and rhythm, no  gallop No  murmur Abd-soft with active BS No Clubbing cyanosis  edema Skin-warm and dry A & Oriented but now x 1   Grossly normal sensory and motor function  ECG sinus @ 76 20/07/37  Device function is normal. Programming changes decreased ventricular rate from 60--40 See Paceart for details      Assessment and  Plan  Atrial fibrillation-paroxysmal  Atrial tach -nonsustained   Pacemaker-St. Jude   Device reprogrammed to have atrial sensing exclude the ring electrode  Syncope-carotid sinus hypersensitivity  Ischemic heart disease-apical wall motion abnormality 2004 but without significant obstruction  Intermittent complete heart block  Nose reversion-atrial/ventricular lead with false afib detection ( ring electrode) .(As above)   Dementia  The patient uses her device infrequently.  It was put in for baroreceptor hypersensitivity which would be anticipated to be a a low use indication.  Still, we will reprogram her from 60--40 and get a sense of her atrial pacing and ventricular pacing percentages.  As she has become increasingly demented, we want to make sure that we are undertaking procedures that are important, and preventing syncope what in my mind still be indicated with generator replacement.  She is here with her general power of attorney but not her healthcare power of attorney they will return in 3 months and we will discuss again  No bleeding on the Eliquis.  Will continue.  Will check her hemoglobin and her renal function.

## 2022-03-22 NOTE — Patient Instructions (Signed)
Medication Instructions:  Your physician recommends that you continue on your current medications as directed. Please refer to the Current Medication list given to you today.  *If you need a refill on your cardiac medications before your next appointment, please call your pharmacy*   Lab Work:  CBC and BMET  If you have labs (blood work) drawn today and your tests are completely normal, you will receive your results only by: [place] (if you have MyChart) OR A paper copy in the mail If you have any lab test that is abnormal or we need to change your treatment, we will call you to review the results.   Testing/Procedures:  None ordered.    Follow-Up: At [facility], you and your health needs are our priority.  As part of our continuing mission to provide you with exceptional heart care, we have created designated Provider Care Teams.  These Care Teams include your primary Cardiologist (physician) and Advanced Practice Providers (APPs -  Physician Assistants and Nurse Practitioners) who all work together to provide you with the care you need, when you need it.  We recommend signing up for the patient portal called "MyChart".  Sign up information is provided on this After Visit Summary.  MyChart is used to connect with patients for Virtual Visits (Telemedicine).  Patients are able to view lab/test results, encounter notes, upcoming appointments, etc.  Non-urgent messages can be sent to your provider as well.   To learn more about what you can do with MyChart, go to [email or web address].    Your next appointment:   3 months with Dr Caryl Comes  Important Information About Sugar

## 2022-03-23 ENCOUNTER — Ambulatory Visit (INDEPENDENT_AMBULATORY_CARE_PROVIDER_SITE_OTHER): Payer: Medicare Other

## 2022-03-23 DIAGNOSIS — I480 Paroxysmal atrial fibrillation: Secondary | ICD-10-CM | POA: Diagnosis not present

## 2022-03-23 LAB — CUP PACEART REMOTE DEVICE CHECK
Battery Remaining Longevity: 3 mo
Battery Remaining Percentage: 2 %
Battery Voltage: 2.66 V
Brady Statistic AP VP Percent: 1 %
Brady Statistic AP VS Percent: 1 %
Brady Statistic AS VP Percent: 1.2 %
Brady Statistic AS VS Percent: 98 %
Brady Statistic RA Percent Paced: 1 %
Brady Statistic RV Percent Paced: 1.4 %
Date Time Interrogation Session: 20231221074255
Implantable Lead Connection Status: 753985
Implantable Lead Connection Status: 753985
Implantable Lead Implant Date: 20130110
Implantable Lead Implant Date: 20130110
Implantable Lead Location: 753859
Implantable Lead Location: 753860
Implantable Pulse Generator Implant Date: 20130110
Lead Channel Impedance Value: 330 Ohm
Lead Channel Impedance Value: 390 Ohm
Lead Channel Pacing Threshold Amplitude: 0.5 V
Lead Channel Pacing Threshold Amplitude: 1 V
Lead Channel Pacing Threshold Pulse Width: 0.4 ms
Lead Channel Pacing Threshold Pulse Width: 0.4 ms
Lead Channel Sensing Intrinsic Amplitude: 12 mV
Lead Channel Sensing Intrinsic Amplitude: 2.3 mV
Lead Channel Setting Pacing Amplitude: 2 V
Lead Channel Setting Pacing Amplitude: 2.5 V
Lead Channel Setting Pacing Pulse Width: 0.4 ms
Lead Channel Setting Sensing Sensitivity: 4.5 mV
Pulse Gen Model: 2210
Pulse Gen Serial Number: 7303108

## 2022-03-23 LAB — CBC
Hematocrit: 36.9 % (ref 34.0–46.6)
Hemoglobin: 12.5 g/dL (ref 11.1–15.9)
MCH: 34.4 pg — ABNORMAL HIGH (ref 26.6–33.0)
MCHC: 33.9 g/dL (ref 31.5–35.7)
MCV: 102 fL — ABNORMAL HIGH (ref 79–97)
Platelets: 266 10*3/uL (ref 150–450)
RBC: 3.63 x10E6/uL — ABNORMAL LOW (ref 3.77–5.28)
RDW: 11.8 % (ref 11.7–15.4)
WBC: 10.2 10*3/uL (ref 3.4–10.8)

## 2022-03-23 LAB — BASIC METABOLIC PANEL
BUN/Creatinine Ratio: 18 (ref 12–28)
BUN: 18 mg/dL (ref 10–36)
CO2: 26 mmol/L (ref 20–29)
Calcium: 9.4 mg/dL (ref 8.7–10.3)
Chloride: 104 mmol/L (ref 96–106)
Creatinine, Ser: 1.02 mg/dL — ABNORMAL HIGH (ref 0.57–1.00)
Glucose: 93 mg/dL (ref 70–99)
Potassium: 4.2 mmol/L (ref 3.5–5.2)
Sodium: 143 mmol/L (ref 134–144)
eGFR: 52 mL/min/{1.73_m2} — ABNORMAL LOW (ref >59)

## 2022-04-05 NOTE — Addendum Note (Signed)
Addended by: Douglass Rivers D on: 04/05/2022 11:59 AM   Modules accepted: Level of Service

## 2022-04-05 NOTE — Progress Notes (Signed)
Remote pacemaker transmission.   

## 2022-04-14 NOTE — Progress Notes (Signed)
Remote pacemaker transmission.   

## 2022-04-18 ENCOUNTER — Emergency Department (HOSPITAL_COMMUNITY)
Admission: EM | Admit: 2022-04-18 | Discharge: 2022-04-18 | Disposition: A | Discharge status: Home / Self Care | Payer: Medicare Other | Attending: Emergency Medicine | Admitting: Emergency Medicine

## 2022-04-18 ENCOUNTER — Emergency Department (HOSPITAL_COMMUNITY): Payer: Medicare Other

## 2022-04-18 ENCOUNTER — Other Ambulatory Visit: Payer: Self-pay

## 2022-04-18 ENCOUNTER — Encounter (HOSPITAL_COMMUNITY): Payer: Self-pay

## 2022-04-18 DIAGNOSIS — R42 Dizziness and giddiness: Secondary | ICD-10-CM | POA: Diagnosis not present

## 2022-04-18 DIAGNOSIS — Y92009 Unspecified place in unspecified non-institutional (private) residence as the place of occurrence of the external cause: Secondary | ICD-10-CM | POA: Insufficient documentation

## 2022-04-18 DIAGNOSIS — W109XXA Fall (on) (from) unspecified stairs and steps, initial encounter: Secondary | ICD-10-CM | POA: Diagnosis not present

## 2022-04-18 DIAGNOSIS — T148XXA Other injury of unspecified body region, initial encounter: Secondary | ICD-10-CM

## 2022-04-18 DIAGNOSIS — I10 Essential (primary) hypertension: Secondary | ICD-10-CM | POA: Diagnosis not present

## 2022-04-18 DIAGNOSIS — S0083XA Contusion of other part of head, initial encounter: Secondary | ICD-10-CM | POA: Insufficient documentation

## 2022-04-18 DIAGNOSIS — S0990XA Unspecified injury of head, initial encounter: Secondary | ICD-10-CM | POA: Diagnosis not present

## 2022-04-18 DIAGNOSIS — R519 Headache, unspecified: Secondary | ICD-10-CM | POA: Diagnosis not present

## 2022-04-18 DIAGNOSIS — W19XXXA Unspecified fall, initial encounter: Secondary | ICD-10-CM | POA: Diagnosis not present

## 2022-04-18 DIAGNOSIS — M47812 Spondylosis without myelopathy or radiculopathy, cervical region: Secondary | ICD-10-CM | POA: Diagnosis not present

## 2022-04-18 DIAGNOSIS — S0033XA Contusion of nose, initial encounter: Secondary | ICD-10-CM | POA: Insufficient documentation

## 2022-04-18 DIAGNOSIS — R040 Epistaxis: Secondary | ICD-10-CM | POA: Insufficient documentation

## 2022-04-18 DIAGNOSIS — S0003XA Contusion of scalp, initial encounter: Secondary | ICD-10-CM | POA: Diagnosis not present

## 2022-04-18 DIAGNOSIS — Z7901 Long term (current) use of anticoagulants: Secondary | ICD-10-CM | POA: Insufficient documentation

## 2022-04-18 DIAGNOSIS — F0390 Unspecified dementia without behavioral disturbance: Secondary | ICD-10-CM | POA: Insufficient documentation

## 2022-04-18 NOTE — ED Triage Notes (Addendum)
Pt arrived via [facility] from home for a mechanicle fall. Pt's caregivers were leaving and she was leaving outside with them when she tripped and fell and hit the left side of her head and left hip on concrete. EMS denies LOC per caregivers. Pt has dementia at baseline. Pt on eliquis. Pt has a hematoma approx the size of a half dollar on left side of forehead. Pt had dried blood on face and from nose. Pt c/o dizziness. Per EMS pt was c/o left hip pain, but it resolved on transport to here.

## 2022-04-18 NOTE — ED Notes (Signed)
Pt ambulated in room without assistance and steady gait

## 2022-04-18 NOTE — ED Notes (Signed)
Patient transported to CT by this RN 

## 2022-04-18 NOTE — ED Provider Notes (Signed)
[facility] EMERGENCY DEPARTMENT Provider Note   CSN: [id number] Arrival date & time: 04/18/22  1647     History  Chief Complaint  Patient presents with   level 2 trauma    Joy Newman is a 89 y.o. female.  Who presents emergency department with a chief complaint of fall.  Patient arrives with EMS as a level 2 trauma due to being on Eliquis.  Patient had a witnessed fall at home after she missed a step coming down out of her home.  She had healthcare workers who are leaving outside.  She fell onto the cement walkway hitting her head.  She has a hematoma and she had epistaxis.  At first she was complaining of hip pain but no longer has any pain in her hips.  She denies loss of consciousness.  She has mild dementia at baseline.  HPI     Home Medications Prior to Admission medications   Medication Sig Start Date End Date Taking? Authorizing Provider  alendronate (FOSAMAX) 70 MG tablet Take 70 mg by mouth once a week. 09/06/20   [provider]  apixaban (ELIQUIS) 2.5 MG TABS tablet Take 1 tablet (2.5 mg total) by mouth 2 (two) times daily. Re-tart this medicine on 11/27/14 11/21/14   Nita Sells, MD  buPROPion (WELLBUTRIN) 100 MG tablet Take 100 mg by mouth daily. 1/2 am, 1/2 pm 08/17/21   [provider]  Cholecalciferol 1.25 MG (50000 UT) TABS Take 1 tablet by mouth daily.    [provider]  diltiazem (CARDIZEM CD) 180 MG 24 hr capsule Take 1 capsule (180 mg total) by mouth every morning. 11/21/14   Nita Sells, MD  Multiple Vitamin (MULTIVITAMIN WITH MINERALS) TABS tablet Take 1 tablet by mouth daily.    [provider]  pantoprazole (PROTONIX) 40 MG tablet Take 40 mg by mouth daily.     [provider]  rosuvastatin (CRESTOR) 5 MG tablet Take 5 mg by mouth daily.    [provider]  sertraline (ZOLOFT) 50 MG tablet Take 50 mg by mouth daily. 50 mg in am 25 mg in pm    [provider]      Allergies    Aricept [donepezil], Imdur [isosorbide mononitrate], Isosorbide nitrate, Polysporin [bacitracin-polymyxin b], Quinine derivatives, and Tetracyclines & related    Review of Systems   Review of Systems  Physical Exam Updated Vital Signs BP (!) 173/109   Pulse 81   Temp 98 F (36.7 C) (Oral)   Resp 16   Ht '5\' 3"'$  (1.6 m)   Wt 54.5 kg   SpO2 97%   BMI 21.28 kg/m  Physical Exam Vitals and nursing note reviewed.  Constitutional:      General: She is not in acute distress.    Appearance: She is well-developed. She is not diaphoretic.  HENT:     Head: Normocephalic.     Comments: Hematoma to the left forehead    Right Ear: Tympanic membrane and external ear normal.     Left Ear: Tympanic membrane and external ear normal.     Nose:     Comments: Bruising over the nasal bridge noted.  She has epistaxis on the left naris which is currently hemostatic    Mouth/Throat:     Mouth: Mucous membranes are moist.  Eyes:     General: No scleral icterus.    Extraocular Movements: Extraocular movements intact.     Conjunctiva/sclera: Conjunctivae normal.  Cardiovascular:  Rate and Rhythm: Normal rate and regular rhythm.     Heart sounds: Normal heart sounds. No murmur heard.    No friction rub. No gallop.  Pulmonary:     Effort: Pulmonary effort is normal. No respiratory distress.     Breath sounds: Normal breath sounds.  Abdominal:     General: Bowel sounds are normal. There is no distension.     Palpations: Abdomen is soft. There is no mass.     Tenderness: There is no abdominal tenderness. There is no guarding.  Musculoskeletal:     Cervical back: Normal range of motion.     Comments: Patient able to move all extremities with both passive and active range of motion without pain.  Her hips are stable with pressure on examination, no midline spinal tenderness.  Skin:    General: Skin is warm and dry.  Neurological:     Mental Status: She is alert and oriented  to person, place, and time.  Psychiatric:        Behavior: Behavior normal.     ED Results / Procedures / Treatments   Labs (all labs ordered are listed, but only abnormal results are displayed) Labs Reviewed - No data to display  EKG EKG Interpretation  Date/Time:  Tuesday April 18 2022 17:03:23 EST Ventricular Rate:  68 PR Interval:  211 QRS Duration: 87 QT Interval:  406 QTC Calculation: 432 R Axis:   37 Text Interpretation: Sinus rhythm Atrial premature complexes Confirmed by Octaviano Glow [phone]) on 04/18/2022 5:21:02 PM  Radiology CT Head Wo Contrast  Result Date: 04/18/2022 CLINICAL DATA:  Tripped and fell, left-sided facial trauma EXAM: CT HEAD WITHOUT CONTRAST CT MAXILLOFACIAL WITHOUT CONTRAST CT CERVICAL SPINE WITHOUT CONTRAST TECHNIQUE: Multidetector CT imaging of the head, cervical spine, and maxillofacial structures were performed using the standard protocol without intravenous contrast. Multiplanar CT image reconstructions of the cervical spine and maxillofacial structures were also generated. RADIATION DOSE REDUCTION: This exam was performed according to the departmental dose-optimization program which includes automated exposure control, adjustment of the mA and/or kV according to patient size and/or use of iterative reconstruction technique. COMPARISON:  03/25/2020 FINDINGS: CT HEAD FINDINGS Brain: Chronic small vessel ischemic changes are seen within the periventricular and subcortical white matter. No evidence of acute infarct or hemorrhage. Diffuse cerebral atrophy with ex vacuo dilatation of the lateral ventricles. Remaining midline structures are unremarkable. No acute extra-axial fluid collections. No mass effect. Vascular: No hyperdense vessel or unexpected calcification. Skull: Small left frontal scalp hematoma. No underlying fracture. The remainder of the calvarium is unremarkable. Other: None. CT MAXILLOFACIAL FINDINGS Osseous: No fracture or mandibular  dislocation. No destructive process. Orbits: Negative. No traumatic or inflammatory finding. Sinuses: Mild mucoperiosteal thickening of the ethmoid air cells. The remaining paranasal sinuses are clear. Soft tissues: Minimal left frontal scalp hematoma. The remaining soft tissues are unremarkable. CT CERVICAL SPINE FINDINGS Alignment: Alignment is anatomic. Skull base and vertebrae: No acute fracture. No primary bone lesion or focal pathologic process. Soft tissues and spinal canal: No prevertebral fluid or swelling. No visible canal hematoma. Disc levels: There is bony fusion across the C4-5 facets bilaterally, as well as partial bony fusion across the C4-5 disc space. Multilevel cervical spondylosis most pronounced at C3-4, C5-6, and C6-7. Upper chest: Airway is patent.  Lung apices are clear. Other: Reconstructed images demonstrate no additional findings. IMPRESSION: 1. No acute intracranial process. 2. No acute facial bone fractures. 3. Mild left frontal scalp hematoma.  No underlying skull fracture.  4. No acute cervical spine fracture. Multilevel spondylosis as above. Electronically Signed   By: Randa Ngo M.D.   On: 04/18/2022 17:40   CT Maxillofacial Wo Contrast  Result Date: 04/18/2022 CLINICAL DATA:  Tripped and fell, left-sided facial trauma EXAM: CT HEAD WITHOUT CONTRAST CT MAXILLOFACIAL WITHOUT CONTRAST CT CERVICAL SPINE WITHOUT CONTRAST TECHNIQUE: Multidetector CT imaging of the head, cervical spine, and maxillofacial structures were performed using the standard protocol without intravenous contrast. Multiplanar CT image reconstructions of the cervical spine and maxillofacial structures were also generated. RADIATION DOSE REDUCTION: This exam was performed according to the departmental dose-optimization program which includes automated exposure control, adjustment of the mA and/or kV according to patient size and/or use of iterative reconstruction technique. COMPARISON:  03/25/2020 FINDINGS: CT  HEAD FINDINGS Brain: Chronic small vessel ischemic changes are seen within the periventricular and subcortical white matter. No evidence of acute infarct or hemorrhage. Diffuse cerebral atrophy with ex vacuo dilatation of the lateral ventricles. Remaining midline structures are unremarkable. No acute extra-axial fluid collections. No mass effect. Vascular: No hyperdense vessel or unexpected calcification. Skull: Small left frontal scalp hematoma. No underlying fracture. The remainder of the calvarium is unremarkable. Other: None. CT MAXILLOFACIAL FINDINGS Osseous: No fracture or mandibular dislocation. No destructive process. Orbits: Negative. No traumatic or inflammatory finding. Sinuses: Mild mucoperiosteal thickening of the ethmoid air cells. The remaining paranasal sinuses are clear. Soft tissues: Minimal left frontal scalp hematoma. The remaining soft tissues are unremarkable. CT CERVICAL SPINE FINDINGS Alignment: Alignment is anatomic. Skull base and vertebrae: No acute fracture. No primary bone lesion or focal pathologic process. Soft tissues and spinal canal: No prevertebral fluid or swelling. No visible canal hematoma. Disc levels: There is bony fusion across the C4-5 facets bilaterally, as well as partial bony fusion across the C4-5 disc space. Multilevel cervical spondylosis most pronounced at C3-4, C5-6, and C6-7. Upper chest: Airway is patent.  Lung apices are clear. Other: Reconstructed images demonstrate no additional findings. IMPRESSION: 1. No acute intracranial process. 2. No acute facial bone fractures. 3. Mild left frontal scalp hematoma.  No underlying skull fracture. 4. No acute cervical spine fracture. Multilevel spondylosis as above. Electronically Signed   By: Randa Ngo M.D.   On: 04/18/2022 17:40   CT Cervical Spine Wo Contrast  Result Date: 04/18/2022 CLINICAL DATA:  Tripped and fell, left-sided facial trauma EXAM: CT HEAD WITHOUT CONTRAST CT MAXILLOFACIAL WITHOUT CONTRAST CT  CERVICAL SPINE WITHOUT CONTRAST TECHNIQUE: Multidetector CT imaging of the head, cervical spine, and maxillofacial structures were performed using the standard protocol without intravenous contrast. Multiplanar CT image reconstructions of the cervical spine and maxillofacial structures were also generated. RADIATION DOSE REDUCTION: This exam was performed according to the departmental dose-optimization program which includes automated exposure control, adjustment of the mA and/or kV according to patient size and/or use of iterative reconstruction technique. COMPARISON:  03/25/2020 FINDINGS: CT HEAD FINDINGS Brain: Chronic small vessel ischemic changes are seen within the periventricular and subcortical white matter. No evidence of acute infarct or hemorrhage. Diffuse cerebral atrophy with ex vacuo dilatation of the lateral ventricles. Remaining midline structures are unremarkable. No acute extra-axial fluid collections. No mass effect. Vascular: No hyperdense vessel or unexpected calcification. Skull: Small left frontal scalp hematoma. No underlying fracture. The remainder of the calvarium is unremarkable. Other: None. CT MAXILLOFACIAL FINDINGS Osseous: No fracture or mandibular dislocation. No destructive process. Orbits: Negative. No traumatic or inflammatory finding. Sinuses: Mild mucoperiosteal thickening of the ethmoid air cells. The remaining paranasal sinuses are  clear. Soft tissues: Minimal left frontal scalp hematoma. The remaining soft tissues are unremarkable. CT CERVICAL SPINE FINDINGS Alignment: Alignment is anatomic. Skull base and vertebrae: No acute fracture. No primary bone lesion or focal pathologic process. Soft tissues and spinal canal: No prevertebral fluid or swelling. No visible canal hematoma. Disc levels: There is bony fusion across the C4-5 facets bilaterally, as well as partial bony fusion across the C4-5 disc space. Multilevel cervical spondylosis most pronounced at C3-4, C5-6, and C6-7.  Upper chest: Airway is patent.  Lung apices are clear. Other: Reconstructed images demonstrate no additional findings. IMPRESSION: 1. No acute intracranial process. 2. No acute facial bone fractures. 3. Mild left frontal scalp hematoma.  No underlying skull fracture. 4. No acute cervical spine fracture. Multilevel spondylosis as above. Electronically Signed   By: Randa Ngo M.D.   On: 04/18/2022 17:40    Procedures Procedures    Medications Ordered in ED Medications - No data to display  ED Course/ Medical Decision Making/ A&P Clinical Course as of 04/20/22 1454  Tue Apr 18, 2022  1720 This is a 89 year old female with a history of mild dementia presenting from home by EMS with a witnessed mechanical fall and isolated injury to the face.  She has a very small stellate laceration to the left temple with some dried blood on her forehead and anterior nares, no gaping wound.  Some mild tenderness over the bridge of the nose but no visible deformity or septal hematoma.  She is mentating normally.  No C-spine tenderness I could appreciate on exam.  She is pending CT imaging of the head and neck as she is on anticoagulation.  EKG per my interpretation shows no high-grade arrhythmia or acute ischemic events [MT]  1842 CT Head Wo Contrast [AH]  1842 CT Maxillofacial Wo Contrast [AH]  1842 CT Cervical Spine Wo Contrast [AH]    Clinical Course User Index [AH] Margarita Mail, PA-C [MT] Langston Masker Carola Rhine, MD                             Medical Decision Making Patient here with Traumatic head injury after mechanical fall. The emergent differential diagnosis for trauma is extensive and requires complex medical decision making. The differential includes, but is not limited to traumatic brain injury, Orbital trauma, maxillofacial trauma, skull fracture, blunt/penetrating neck trauma, vertebral artery dissection, whiplash, cervical fracture, neurogenic shock, spinal cord injury, thoracic trauma  (blunt/penetrating) cardiac trauma, thoracic and lumbar spine trauma. Abdominal trauma (blunt. Penetrating), genitourinary trauma, extremity fractures, skin lacerations/ abrasions, vascular injuries.  After physical exam and review of all data points patient does not appear to have any significant injuries.  Patient ambulatory in the er without ataxia, weakness, or imbalance. She uses a walker at home and had single assist.She has home health aids who stay with her 24/7. Will dc home with safety precautions. Home health pt.ot   Amount and/or Complexity of Data Reviewed Radiology: ordered. Decision-making details documented in ED Course.           Final Clinical Impression(s) / ED Diagnoses Final diagnoses:  Fall, initial encounter  Hematoma and contusion  Epistaxis due to trauma    Rx / DC Orders ED Discharge Orders          [place]        04/18/22 2015    Face-to-face encounter (required for Medicare/Medicaid patients)  Comments: I Margarita Mail certify that this patient is under my care and that I, or a nurse practitioner or physician's assistant working with me, had a face-to-face encounter that meets the physician face-to-face encounter requirements with this patient on 04/18/2022. The encounter with the patient was in whole, or in part for the following medical condition(s) which is the primary reason for home health care (List medical condition):increased falls at home   04/18/22 2015              Margarita Mail, PA-C 04/20/22 1454    Wyvonnia Dusky, MD 04/21/22 1655

## 2022-04-18 NOTE — Progress Notes (Signed)
Orthopedic Tech Progress Note Patient Details:  Joy Newman Jan 23, 1928 [id number] Level 2 Trauma. Not needed Patient ID: Joy Newman, female   DOB: [birth year], 89 y.o.   MRN: [id number]  [name] 04/18/2022, 4:52 PM

## 2022-04-18 NOTE — Discharge Instructions (Signed)
Ply ice to be for to the area.  Please try to sleep with your head to about 30 degrees not flat this will decrease amount of swelling. Get help right away if: You have: A very bad headache that is not helped by medicine. Trouble walking or weakness in your arms and legs. Clear or bloody fluid coming from your nose or ears. Changes in how you see (vision). A seizure. More confusion or more grumpy moods. Your symptoms get worse. You are sleepier than normal and have trouble staying awake. You lose your balance. The black centers of your eyes (pupils) change in size. Your speech is slurred. Your dizziness gets worse. You vomit. These symptoms may be an emergency. Do not wait to see if the symptoms will go away. Get medical help right away. Call your local emergency services (911 in the [place] Do not drive yourself to the hospital.

## 2022-04-19 ENCOUNTER — Other Ambulatory Visit: Payer: Self-pay

## 2022-04-24 ENCOUNTER — Ambulatory Visit: Payer: Medicare Other | Attending: Internal Medicine

## 2022-04-24 DIAGNOSIS — I442 Atrioventricular block, complete: Secondary | ICD-10-CM

## 2022-04-25 LAB — CUP PACEART REMOTE DEVICE CHECK
Battery Remaining Longevity: 1 mo
Battery Remaining Percentage: 1 %
Battery Voltage: 2.63 V
Brady Statistic AP VP Percent: 1 %
Brady Statistic AP VS Percent: 1 %
Brady Statistic AS VP Percent: 2.1 %
Brady Statistic AS VS Percent: 97 %
Brady Statistic RA Percent Paced: 1 %
Brady Statistic RV Percent Paced: 2.2 %
Date Time Interrogation Session: 20240121035348
Implantable Lead Connection Status: 753985
Implantable Lead Connection Status: 753985
Implantable Lead Implant Date: 20130110
Implantable Lead Implant Date: 20130110
Implantable Lead Location: 753859
Implantable Lead Location: 753860
Implantable Pulse Generator Implant Date: 20130110
Lead Channel Impedance Value: 340 Ohm
Lead Channel Impedance Value: 400 Ohm
Lead Channel Pacing Threshold Amplitude: 0.5 V
Lead Channel Pacing Threshold Amplitude: 1 V
Lead Channel Pacing Threshold Pulse Width: 0.4 ms
Lead Channel Pacing Threshold Pulse Width: 0.4 ms
Lead Channel Sensing Intrinsic Amplitude: 12 mV
Lead Channel Sensing Intrinsic Amplitude: 2.4 mV
Lead Channel Setting Pacing Amplitude: 2 V
Lead Channel Setting Pacing Amplitude: 2.5 V
Lead Channel Setting Pacing Pulse Width: 0.4 ms
Lead Channel Setting Sensing Sensitivity: 4.5 mV
Pulse Gen Model: 2210
Pulse Gen Serial Number: 7303108

## 2022-05-24 LAB — CUP PACEART REMOTE DEVICE CHECK
Battery Remaining Longevity: 1 mo
Battery Remaining Percentage: 1 %
Battery Voltage: 2.63 V
Brady Statistic AP VP Percent: 1 %
Brady Statistic AP VS Percent: 1 %
Brady Statistic AS VP Percent: 1.6 %
Brady Statistic AS VS Percent: 98 %
Brady Statistic RA Percent Paced: 1 %
Brady Statistic RV Percent Paced: 1.7 %
Date Time Interrogation Session: 20240221020443
Implantable Lead Connection Status: 753985
Implantable Lead Connection Status: 753985
Implantable Lead Implant Date: 20130110
Implantable Lead Implant Date: 20130110
Implantable Lead Location: 753859
Implantable Lead Location: 753860
Implantable Pulse Generator Implant Date: 20130110
Lead Channel Impedance Value: 340 Ohm
Lead Channel Impedance Value: 430 Ohm
Lead Channel Pacing Threshold Amplitude: 0.5 V
Lead Channel Pacing Threshold Amplitude: 1 V
Lead Channel Pacing Threshold Pulse Width: 0.4 ms
Lead Channel Pacing Threshold Pulse Width: 0.4 ms
Lead Channel Sensing Intrinsic Amplitude: 12 mV
Lead Channel Sensing Intrinsic Amplitude: 2.5 mV
Lead Channel Setting Pacing Amplitude: 2 V
Lead Channel Setting Pacing Amplitude: 2.5 V
Lead Channel Setting Pacing Pulse Width: 0.4 ms
Lead Channel Setting Sensing Sensitivity: 4.5 mV
Pulse Gen Model: 2210
Pulse Gen Serial Number: 7303108

## 2022-05-25 ENCOUNTER — Ambulatory Visit (INDEPENDENT_AMBULATORY_CARE_PROVIDER_SITE_OTHER): Payer: Medicare Other

## 2022-05-25 DIAGNOSIS — I442 Atrioventricular block, complete: Secondary | ICD-10-CM

## 2022-06-09 NOTE — Addendum Note (Signed)
Addended by: Douglass Rivers D on: 06/09/2022 02:39 PM   Modules accepted: Level of Service

## 2022-06-09 NOTE — Progress Notes (Signed)
Remote pacemaker transmission.   

## 2022-06-13 ENCOUNTER — Ambulatory Visit: Payer: Medicare Other | Attending: Internal Medicine | Admitting: Internal Medicine

## 2022-06-13 ENCOUNTER — Encounter: Payer: Self-pay | Admitting: Internal Medicine

## 2022-06-13 ENCOUNTER — Telehealth: Payer: Self-pay

## 2022-06-13 VITALS — BP 114/66 | HR 82 | Ht 63.0 in | Wt 113.0 lb

## 2022-06-13 DIAGNOSIS — Z950 Presence of cardiac pacemaker: Secondary | ICD-10-CM | POA: Diagnosis not present

## 2022-06-13 DIAGNOSIS — I480 Paroxysmal atrial fibrillation: Secondary | ICD-10-CM | POA: Diagnosis not present

## 2022-06-13 DIAGNOSIS — I4719 Other supraventricular tachycardia: Secondary | ICD-10-CM | POA: Insufficient documentation

## 2022-06-13 DIAGNOSIS — I442 Atrioventricular block, complete: Secondary | ICD-10-CM

## 2022-06-13 NOTE — Telephone Encounter (Signed)
Per Caryl Comes the patient is no longer remotely monitored. Joy Newman, cma

## 2022-06-13 NOTE — Progress Notes (Signed)
Feeling better     Patient Care Team: Tisovec, Fransico Him, MD as PCP - General (Internal Medicine)   HPI  Joy Newman is a 89 y.o. female Seen in followup for pacemaker implantation January 2013 for syncope and intermittent complete heart block.  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 Remote history of myocardial infarction and ischemic heart disease.     She told me about her support of the [facility] in [place] received a reward for her effort; that is now complete  Her device is approaching ERI.  At the last appointment, we decreased lower rate limit to 40, ventricular pacing percentages 1.7% and at least some of this is related to noise reversion and ventricular pacing  She has no complaints but also no recollection DATE TEST EF   2/20 Echo  >65 %     Date Cr K Hgb  2/20 0.85 3.5 12.2  6/20 085 4.4   12/23  4.2      Past Medical History:  Diagnosis Date   Arteriovenous malformation of colon    Basal cell carcinoma    CAD (coronary artery disease) no obstruction at cath    Hx of with an anteroapical M.I   Carotid sinus hypersensitivity    Complete heart block-intermittent    Diastolic dysfunction    Diverticulosis    Fall 05/2018   Heart disease    Hyperlipidemia    Hypertension    Kidney stones    Melanoma ([place])    Memory difficulty 02/25/2020   MVP (mitral valve prolapse)    Myocardial infarction [facility])    2005   Orthostasis    Pacemaker    St Jude   Pulmonary hypertension (HCC)     Hx of Mild   PVC and PACs    Squamous carcinoma    Syncope    Tricuspid regurgitation    Moderate regurgitation   Tubular adenoma of colon 02/2012    Past Surgical History:  Procedure Laterality Date   ABDOMINAL HYSTERECTOMY  1980   Hx of   APPENDECTOMY  1954   Hx of   CARDIAC CATHETERIZATION      Revealed an apical wall motion abnormality   CARDIOVASCULAR STRESS TEST  02-21-2006   EF 84%, which revealed an apical defect   CATARACT EXTRACTION Bilateral    NASAL RECONSTRUCTION  1981   PACEMAKER INSERTION     PERMANENT PACEMAKER INSERTION N/A 04/13/2011   Procedure: PERMANENT PACEMAKER INSERTION;  Surgeon: Evans Lance, MD;  Location: [facility] CATH LAB;  Service: Cardiovascular;  Laterality: N/A;   SKIN CANCER EXCISION     several, basal cell, squamous, melanoma   TONSILLECTOMY  1971   Hx of    Current Outpatient Medications  Medication Sig Dispense Refill   alendronate (FOSAMAX) 70 MG tablet Take 70 mg by mouth once a week.     apixaban (ELIQUIS) 2.5 MG TABS tablet Take 1 tablet (2.5 mg total) by mouth 2 (two) times daily. Re-tart this medicine on 11/27/14 60 tablet 11   buPROPion (WELLBUTRIN) 100 MG tablet Take 100 mg by mouth daily. 1/2 am, 1/2 pm     Cholecalciferol 1.25 MG (50000 UT) TABS Take 1 tablet by mouth daily.     diltiazem (CARDIZEM CD) 180 MG 24 hr capsule Take 1 capsule (180 mg total) by mouth every morning.     Multiple Vitamin (MULTIVITAMIN WITH MINERALS) TABS tablet  Take 1 tablet by mouth daily.     pantoprazole (PROTONIX) 40 MG tablet Take 40 mg by mouth daily.      rosuvastatin (CRESTOR) 5 MG tablet Take 5 mg by mouth daily.     sertraline (ZOLOFT) 50 MG tablet Take 50 mg by mouth daily. 50 mg in am 25 mg in pm     No current facility-administered medications for this visit.    Allergies  Allergen Reactions   Aricept [Donepezil]     Irritability, vivid dreams   Imdur [Isosorbide Mononitrate]     Intolerant and cause headaches and nausea   Isosorbide Nitrate Other (See Comments) and Nausea Only   Polysporin [Bacitracin-Polymyxin B]     Rash   Quinine Derivatives     Increase in Heart Rate and Decrease in BP    Tetracyclines & Related     Rash    Review of Systems negative except from HPI and PMH  Physical Exam BP 114/66   Pulse 82   Ht '5\' 3"'$   (1.6 m)   Wt 113 lb (51.3 kg)   SpO2 97%   BMI 20.02 kg/m  Well developed and well nourished in no acute distress HENT normal Neck supple   Clear Device pocket well healed; without hematoma or erythema.  There is no tethering  Regular rate and rhythm, no  gallop No murmur Abd-soft with active BS No Clubbing cyanosis  edema Skin-warm and dry A  Grossly normal sensory and motor function  ECG sinus at 82 Interval 21/07/37 Nonspecific ST-T changes  Device function isnormal. Programming changes   See Paceart for details        Assessment and  Plan  Atrial fibrillation-paroxysmal  Atrial tach -nonsustained   Pacemaker-St. Jude   Device reprogrammed to have atrial sensing exclude the ring electrode  Syncope-carotid sinus hypersensitivity  Ischemic heart disease-apical wall motion abnormality 2004 but without significant obstruction  Intermittent complete heart block  Nose reversion-atrial/ventricular lead with false afib detection ( ring electrode) .(As above)   Dementia  Ventricular pacing is at 1.7% which as noted above, is associated least in part with noise reversion and inappropriate tracking.  No syncope.  One of her healthcare POA's is here today and has communicated with the other 2, 1 his wife and 1 the patient's niece, and they are agreeable that we will not proceed with device generator replacement as she uses it hardly never and while it would be done under local anesthesia without sedation there is always a risk of infection.  I also suggest to him that will have no impact on the trajectory of her dying.  Will be available as needed

## 2022-06-13 NOTE — Patient Instructions (Signed)
Medication Instructions:  Your physician recommends that you continue on your current medications as directed. Please refer to the Current Medication list given to you today.  *If you need a refill on your cardiac medications before your next appointment, please call your pharmacy*   Lab Work: None ordered.  If you have labs (blood work) drawn today and your tests are completely normal, you will receive your results only by: MyChart Message (if you have MyChart) OR A paper copy in the mail If you have any lab test that is abnormal or we need to change your treatment, we will call you to review the results.   Testing/Procedures: None ordered.    Follow-Up: At [facility], you and your health needs are our priority.  As part of our continuing mission to provide you with exceptional heart care, we have created designated Provider Care Teams.  These Care Teams include your primary Cardiologist (physician) and Advanced Practice Providers (APPs -  Physician Assistants and Nurse Practitioners) who all work together to provide you with the care you need, when you need it.  We recommend signing up for the patient portal called "MyChart".  Sign up information is provided on this After Visit Summary.  MyChart is used to connect with patients for Virtual Visits (Telemedicine).  Patients are able to view lab/test results, encounter notes, upcoming appointments, etc.  Non-urgent messages can be sent to your provider as well.   To learn more about what you can do with MyChart, go to [email or web address].    Your next appointment:   Follow up with Dr Klein as needed 

## 2022-06-19 NOTE — Progress Notes (Signed)
Remote pacemaker transmission.   

## 2022-06-26 ENCOUNTER — Ambulatory Visit: Payer: Medicare Other

## 2022-07-27 ENCOUNTER — Ambulatory Visit: Payer: Medicare Other

## 2022-08-29 ENCOUNTER — Ambulatory Visit: Payer: Medicare Other

## 2022-09-07 DIAGNOSIS — I700 Atherosclerosis of aorta: Secondary | ICD-10-CM | POA: Diagnosis not present

## 2022-09-07 DIAGNOSIS — E559 Vitamin D deficiency, unspecified: Secondary | ICD-10-CM | POA: Diagnosis not present

## 2022-09-07 DIAGNOSIS — Z950 Presence of cardiac pacemaker: Secondary | ICD-10-CM | POA: Diagnosis not present

## 2022-09-07 DIAGNOSIS — F0280 Dementia in other diseases classified elsewhere without behavioral disturbance: Secondary | ICD-10-CM | POA: Diagnosis not present

## 2022-09-07 DIAGNOSIS — I951 Orthostatic hypotension: Secondary | ICD-10-CM | POA: Diagnosis not present

## 2022-09-07 DIAGNOSIS — I4821 Permanent atrial fibrillation: Secondary | ICD-10-CM | POA: Diagnosis not present

## 2022-09-07 DIAGNOSIS — Z7901 Long term (current) use of anticoagulants: Secondary | ICD-10-CM | POA: Diagnosis not present

## 2022-09-07 DIAGNOSIS — G309 Alzheimer's disease, unspecified: Secondary | ICD-10-CM | POA: Diagnosis not present

## 2022-09-07 DIAGNOSIS — E7800 Pure hypercholesterolemia, unspecified: Secondary | ICD-10-CM | POA: Diagnosis not present

## 2022-09-07 DIAGNOSIS — I129 Hypertensive chronic kidney disease with stage 1 through stage 4 chronic kidney disease, or unspecified chronic kidney disease: Secondary | ICD-10-CM | POA: Diagnosis not present

## 2022-09-07 DIAGNOSIS — N1831 Chronic kidney disease, stage 3a: Secondary | ICD-10-CM | POA: Diagnosis not present

## 2022-09-07 DIAGNOSIS — D6869 Other thrombophilia: Secondary | ICD-10-CM | POA: Diagnosis not present

## 2022-10-26 ENCOUNTER — Telehealth: Payer: Self-pay

## 2022-10-26 NOTE — Telephone Encounter (Signed)
Pt returning nurses phone call. Please advise ?

## 2022-10-26 NOTE — Telephone Encounter (Signed)
Pacemaker reached ERI 10/25/2022. Attempted to call patient/family to answer. LMTCB.

## 2022-10-26 NOTE — Telephone Encounter (Signed)
Spoke to patients POA ~ Joy Newman who advises per previous discussion he and Dr. Graciela Husbands agreed not to replace pacemaker. Please see the note below in apt 06/13/2022.

## 2022-11-19 ENCOUNTER — Other Ambulatory Visit: Payer: Self-pay

## 2022-11-19 ENCOUNTER — Emergency Department (HOSPITAL_BASED_OUTPATIENT_CLINIC_OR_DEPARTMENT_OTHER)
Admission: EM | Admit: 2022-11-19 | Discharge: 2022-11-19 | Disposition: A | Discharge status: Home / Self Care | Payer: Medicare Other | Attending: Emergency Medicine | Admitting: Emergency Medicine

## 2022-11-19 ENCOUNTER — Encounter (HOSPITAL_BASED_OUTPATIENT_CLINIC_OR_DEPARTMENT_OTHER): Payer: Self-pay | Admitting: Emergency Medicine

## 2022-11-19 DIAGNOSIS — F0280 Dementia in other diseases classified elsewhere without behavioral disturbance: Secondary | ICD-10-CM | POA: Diagnosis not present

## 2022-11-19 DIAGNOSIS — S0990XA Unspecified injury of head, initial encounter: Secondary | ICD-10-CM

## 2022-11-19 DIAGNOSIS — Z23 Encounter for immunization: Secondary | ICD-10-CM | POA: Diagnosis not present

## 2022-11-19 DIAGNOSIS — W19XXXA Unspecified fall, initial encounter: Secondary | ICD-10-CM | POA: Insufficient documentation

## 2022-11-19 DIAGNOSIS — Z7901 Long term (current) use of anticoagulants: Secondary | ICD-10-CM | POA: Diagnosis not present

## 2022-11-19 DIAGNOSIS — S01312A Laceration without foreign body of left ear, initial encounter: Secondary | ICD-10-CM | POA: Diagnosis not present

## 2022-11-19 DIAGNOSIS — S0991XA Unspecified injury of ear, initial encounter: Secondary | ICD-10-CM | POA: Diagnosis present

## 2022-11-19 DIAGNOSIS — Z79899 Other long term (current) drug therapy: Secondary | ICD-10-CM | POA: Insufficient documentation

## 2022-11-19 DIAGNOSIS — N189 Chronic kidney disease, unspecified: Secondary | ICD-10-CM | POA: Insufficient documentation

## 2022-11-19 DIAGNOSIS — I129 Hypertensive chronic kidney disease with stage 1 through stage 4 chronic kidney disease, or unspecified chronic kidney disease: Secondary | ICD-10-CM | POA: Diagnosis not present

## 2022-11-19 DIAGNOSIS — G309 Alzheimer's disease, unspecified: Secondary | ICD-10-CM | POA: Diagnosis not present

## 2022-11-19 MED ORDER — CIPROFLOXACIN HCL 500 MG PO TABS: 500.0000 mg | ORAL_TABLET | Freq: Two times a day (BID) | ORAL | 0 refills | Status: AC

## 2022-11-19 MED ORDER — LIDOCAINE-EPINEPHRINE (PF) 2 %-1:200000 IJ SOLN
10.0000 mL | Freq: Once | INTRAMUSCULAR | Status: AC
  Administered 2022-11-19: 10 mL
  Filled 2022-11-19: qty 20

## 2022-11-19 MED ORDER — TETANUS-DIPHTH-ACELL PERTUSSIS 5-2.5-18.5 LF-MCG/0.5 IM SUSY
0.5000 mL | PREFILLED_SYRINGE | Freq: Once | INTRAMUSCULAR | Status: AC
  Administered 2022-11-19: 0.5 mL via INTRAMUSCULAR
  Filled 2022-11-19: qty 0.5

## 2022-11-19 NOTE — ED Provider Notes (Signed)
[place] EMERGENCY DEPARTMENT AT [facility] Provider Note   CSN: [id number] Arrival date & time: 11/19/22  1334     History {Add pertinent medical, surgical, social history, OB history to HPI:1} Chief Complaint  Patient presents with   Joy Newman is a 89 y.o. female.  89 year old female with history of Alzheimer's dementia requiring around-the-clock care, CKD, hypertension, and atrial fibrillation on Eliquis who presents emergency department after an unwitnessed fall with a laceration.  Patient got out of bed last night and patient aides heard the bed alarm go off and went to the room and found her on the ground.  Has a laceration to her left ear and right forehead.  Takes Eliquis and last took a dose at 545 last night.  Was not given Eliquis today because of her fall.  Bleeding has continued to persist from her ear.  Friend and caretaker at the bedside who reported that the patient does not want any additional head CTs according to her previous wishes since she would not want any sort of surgery to be performed.       Home Medications Prior to Admission medications   Medication Sig Start Date End Date Taking? Authorizing Provider  alendronate (FOSAMAX) 70 MG tablet Take 70 mg by mouth once a week. 09/06/20   [provider]  apixaban (ELIQUIS) 2.5 MG TABS tablet Take 1 tablet (2.5 mg total) by mouth 2 (two) times daily. Re-tart this medicine on 11/27/14 11/21/14   Rhetta Mura, MD  buPROPion (WELLBUTRIN) 100 MG tablet Take 100 mg by mouth daily. 1/2 am, 1/2 pm 08/17/21   [provider]  Cholecalciferol 1.25 MG (50000 UT) TABS Take 1 tablet by mouth daily.    [provider]  diltiazem (CARDIZEM CD) 180 MG 24 hr capsule Take 1 capsule (180 mg total) by mouth every morning. 11/21/14   Rhetta Mura, MD  Multiple Vitamin (MULTIVITAMIN WITH MINERALS) TABS tablet Take 1 tablet by mouth daily.    [provider]   pantoprazole (PROTONIX) 40 MG tablet Take 40 mg by mouth daily.     [provider]  rosuvastatin (CRESTOR) 5 MG tablet Take 5 mg by mouth daily.    [provider]  sertraline (ZOLOFT) 50 MG tablet Take 50 mg by mouth daily. 50 mg in am 25 mg in pm    [provider]      Allergies    Aricept [donepezil], Imdur [isosorbide mononitrate], Isosorbide nitrate, Polysporin [bacitracin-polymyxin b], Quinine derivatives, and Tetracyclines & related    Review of Systems   Review of Systems  Physical Exam Updated Vital Signs BP (!) 142/60 (BP Location: Left Arm)   Pulse 74   Temp 98 F (36.7 C) (Oral)   Resp 18   SpO2 96%  Physical Exam Vitals and nursing note reviewed.  Constitutional:      General: She is not in acute distress.    Appearance: She is well-developed.  HENT:     Head: Normocephalic.     Comments: No Battle sign or raccoon eyes    Right Ear: External ear normal.     Ears:     Comments: Laceration of the helix of the left ear    Nose: Nose normal.  Eyes:     Extraocular Movements: Extraocular movements intact.     Conjunctiva/sclera: Conjunctivae normal.     Pupils: Pupils are equal, round, and reactive to light.     Comments: Abrasion of right  eyebrow  Neck:     Comments: No C-spine midline tenderness to palpation Cardiovascular:     Rate and Rhythm: Normal rate and regular rhythm.  Pulmonary:     Effort: Pulmonary effort is normal. No respiratory distress.  Abdominal:     General: Abdomen is flat. There is no distension.     Palpations: Abdomen is soft.     Tenderness: There is no abdominal tenderness. There is no guarding.  Musculoskeletal:     Cervical back: Normal range of motion and neck supple.     Right lower leg: No edema.     Left lower leg: No edema.  Skin:    General: Skin is warm and dry.  Neurological:     Mental Status: She is alert. Mental status is at baseline.     Comments: Oriented to self and place.  At  baseline.  Moving all 4 extremities.  Psychiatric:        Mood and Affect: Mood normal.     ED Results / Procedures / Treatments   Labs (all labs ordered are listed, but only abnormal results are displayed) Labs Reviewed - No data to display  EKG None  Radiology No results found.  Procedures Procedures  {Document cardiac monitor, telemetry assessment procedure when appropriate:1}  Medications Ordered in ED Medications  lidocaine-EPINEPHrine (XYLOCAINE W/EPI) 2 %-1:200000 (PF) injection 10 mL (has no administration in time range)  Tdap (BOOSTRIX) injection 0.5 mL (has no administration in time range)    ED Course/ Medical Decision Making/ A&P   {   Click here for ABCD2, HEART and other calculatorsREFRESH Note before signing :1}                              Medical Decision Making Risk Prescription drug management.   ***  {Document critical care time when appropriate:1} {Document review of labs and clinical decision tools ie heart score, Chads2Vasc2 etc:1}  {Document your independent review of radiology images, and any outside records:1} {Document your discussion with family members, caretakers, and with consultants:1} {Document social determinants of health affecting pt's care:1} {Document your decision making why or why not admission, treatments were needed:1} Final Clinical Impression(s) / ED Diagnoses Final diagnoses:  None    Rx / DC Orders ED Discharge Orders     None

## 2022-11-19 NOTE — Discharge Instructions (Addendum)
You were seen for your cut (laceration) in the emergency department.   At home, please keep the area completely dry for 48 hours.  Keep the compression dressing on for 48 hours.  After that you may wash with soap and water but do not submerge until your stitches are removed or they fall out.    Scars are unavoidable but they can be less noticeable if you prevent sun exposure by covering with clothing or wearing sunscreen over the area for the next 6 months.   Follow-up to have your stitches removed at your primary doctor's office, urgent care, or emergency department in 5 to 7 days.  Take the antibiotics to prevent any infection.  Return immediately to the emergency department if you experience any of the following: Drainage from your wound, redness around your wound, fevers, or any other concerning symptoms.    Thank you for visiting our Emergency Department. It was a pleasure taking care of you today.

## 2022-11-19 NOTE — ED Notes (Signed)
Pt d/c home with visitor per MD order. Discharge summary reviewed, Off unit via WC- No s/s of acute distress noted.

## 2022-11-19 NOTE — ED Triage Notes (Signed)
Pt presents to ED Pov. Per friend pt has unwitnessed fall this morning. Pt demented at baseline but per friend has been acting normal. Pt has lac to L ear and above R eye brow. Takes eliqus but skipped today

## 2022-11-20 ENCOUNTER — Other Ambulatory Visit: Payer: Self-pay

## 2022-11-20 ENCOUNTER — Emergency Department (HOSPITAL_BASED_OUTPATIENT_CLINIC_OR_DEPARTMENT_OTHER)
Admission: EM | Admit: 2022-11-20 | Discharge: 2022-11-20 | Disposition: A | Discharge status: Home / Self Care | Payer: Medicare Other | Attending: Emergency Medicine | Admitting: Emergency Medicine

## 2022-11-20 DIAGNOSIS — X58XXXD Exposure to other specified factors, subsequent encounter: Secondary | ICD-10-CM | POA: Insufficient documentation

## 2022-11-20 DIAGNOSIS — S01312D Laceration without foreign body of left ear, subsequent encounter: Secondary | ICD-10-CM

## 2022-11-20 DIAGNOSIS — F0390 Unspecified dementia without behavioral disturbance: Secondary | ICD-10-CM | POA: Insufficient documentation

## 2022-11-20 DIAGNOSIS — Z7901 Long term (current) use of anticoagulants: Secondary | ICD-10-CM | POA: Insufficient documentation

## 2022-11-20 NOTE — ED Provider Notes (Signed)
[place] EMERGENCY DEPARTMENT AT [facility] Provider Note   CSN: [id number] Arrival date & time: 11/20/22  1031     History  Chief Complaint  Patient presents with   Ear Laceration    Joy Newman is a 89 y.o. female.  Patient presents to the emergency department today with family and caregiver for reevaluation of left ear laceration.  This has been oozing blood overnight and needed the dressing changed approximately every 2 hours.  Patient is anticoagulated on Eliquis for atrial fibrillation.  She has not taken anticoagulation today.  No new falls or injuries.  Wound was closed with sutures yesterday.  Patient has developed an auricular hematoma.       Home Medications Prior to Admission medications   Medication Sig Start Date End Date Taking? Authorizing Provider  alendronate (FOSAMAX) 70 MG tablet Take 70 mg by mouth once a week. 09/06/20   [provider]  apixaban (ELIQUIS) 2.5 MG TABS tablet Take 1 tablet (2.5 mg total) by mouth 2 (two) times daily. Re-tart this medicine on 11/27/14 11/21/14   Rhetta Mura, MD  buPROPion (WELLBUTRIN) 100 MG tablet Take 100 mg by mouth daily. 1/2 am, 1/2 pm 08/17/21   [provider]  Cholecalciferol 1.25 MG (50000 UT) TABS Take 1 tablet by mouth daily.    [provider]  ciprofloxacin (CIPRO) 500 MG tablet Take 1 tablet (500 mg total) by mouth every 12 (twelve) hours for 5 days. 11/19/22 11/24/22  Rondel Baton, MD  diltiazem (CARDIZEM CD) 180 MG 24 hr capsule Take 1 capsule (180 mg total) by mouth every morning. 11/21/14   Rhetta Mura, MD  Multiple Vitamin (MULTIVITAMIN WITH MINERALS) TABS tablet Take 1 tablet by mouth daily.    [provider]  pantoprazole (PROTONIX) 40 MG tablet Take 40 mg by mouth daily.     [provider]  rosuvastatin (CRESTOR) 5 MG tablet Take 5 mg by mouth daily.    [provider]  sertraline (ZOLOFT) 50 MG tablet Take 50 mg  by mouth daily. 50 mg in am 25 mg in pm    [provider]      Allergies    Aricept [donepezil], Imdur [isosorbide mononitrate], Isosorbide nitrate, Polysporin [bacitracin-polymyxin b], Quinine derivatives, and Tetracyclines & related    Review of Systems   Review of Systems  Physical Exam Updated Vital Signs BP (!) 135/54 (BP Location: Right Arm)   Pulse 74   Temp (!) 97.1 F (36.2 C) (Oral)   Resp 18   Ht 5\' 4"  (1.626 m)   SpO2 99%   BMI 19.40 kg/m    Physical Exam Vitals and nursing note reviewed.  Constitutional:      Appearance: She is well-developed.  HENT:     Head: Normocephalic.     Ears:     Comments: Patient with sutures in place, ear appears to have been bleeding from the superior portion of the auricle.  No active bleeding.  There is some dried blood that is pooling.  Auricular hematoma noted. Eyes:     Pupils: Pupils are equal, round, and reactive to light.  Cardiovascular:     Pulses: Normal pulses. No decreased pulses.  Musculoskeletal:        General: No tenderness.     Cervical back: Normal range of motion and neck supple.  Skin:    General: Skin is warm and dry.  Neurological:     Mental Status: She is alert.  Sensory: No sensory deficit.  Psychiatric:        Mood and Affect: Mood normal.     ED Results / Procedures / Treatments   Labs (all labs ordered are listed, but only abnormal results are displayed) Labs Reviewed - No data to display  EKG None  Radiology No results found.  Procedures Procedures    Medications Ordered in ED Medications - No data to display  ED Course/ Medical Decision Making/ A&P   Patient seen and examined.  Reviewed ED notes from yesterday.  Additional history from family and caretaker at bedside.  Labs/EKG: None ordered  Imaging: None ordered  Medications/Fluids: None ordered  Most recent vital signs reviewed and are as follows: BP (!) 135/54 (BP Location: Right Arm)   Pulse 74    Temp (!) 97.1 F (36.2 C) (Oral)   Resp 18   Ht 5\' 4"  (1.626 m)   SpO2 99%   BMI 19.40 kg/m   Initial impression: Bleeding from left ear wound, fortunately stopped at this time.  Several 4 x 4 pieces of gauze were cut down the middle and placed around the posterior portion of the air as a bolster and then cotton rolls were secured over the area of bleeding with a Coban wrap.  No further bleeding noted.  Home treatment plan: Leave dressing in place overnight, removed tomorrow and wash gently with mild soap and water.  She is on anticoagulation for atrial fibrillation.  She will probably need to hold this for an additional 48 hours and can restart on the 21st.  Family is comfortable with that.  Return instructions discussed with family: Recurrent bleeding, new symptoms or worsening symptoms.  Follow-up instructions discussed with patient: Follow-up for suture removal as instructed yesterday                                Medical Decision Making  Patient on anticoagulation, left ear laceration with bleeding overnight.  This has slowed.  She does have a hematoma.  Bandaging replaced as above.  Instructions as above.  She is at her mental status baseline.        Final Clinical Impression(s) / ED Diagnoses Final diagnoses:  Laceration of left earlobe, subsequent encounter    Rx / DC Orders ED Discharge Orders     None         Renne Crigler, PA-C 11/20/22 1245    Tegeler, Canary Brim, MD 11/20/22 1505

## 2022-11-20 NOTE — ED Triage Notes (Signed)
Pt to ED c/o ear problem. Pt was seen her yesterday for the same left ear injury, resulting in stitches, caregiver reports, pt may have grabbed at left ear, and now has continued to bleed all through the night, saturating gauze q 2 hours. HX dementia. Takes eliquis.

## 2022-11-20 NOTE — Discharge Instructions (Addendum)
Please leave the dressing in place overnight.  You may remove tomorrow and gently clean the area.  Do not scrub harshly.  Please restart the blood thinner on Wednesday.  Return to the emergency department with any uncontrolled bleeding or other concerns.

## 2022-11-20 NOTE — ED Notes (Signed)
Pt ear oozing blood. Spoke with PA and rewrapped pt ear.

## 2022-11-24 DIAGNOSIS — I4821 Permanent atrial fibrillation: Secondary | ICD-10-CM | POA: Diagnosis not present

## 2022-11-24 DIAGNOSIS — Z515 Encounter for palliative care: Secondary | ICD-10-CM | POA: Diagnosis not present

## 2022-11-24 DIAGNOSIS — R627 Adult failure to thrive: Secondary | ICD-10-CM | POA: Diagnosis not present

## 2022-11-24 DIAGNOSIS — F0280 Dementia in other diseases classified elsewhere without behavioral disturbance: Secondary | ICD-10-CM | POA: Diagnosis not present

## 2022-11-24 DIAGNOSIS — Z7901 Long term (current) use of anticoagulants: Secondary | ICD-10-CM | POA: Diagnosis not present

## 2022-11-24 DIAGNOSIS — S01312A Laceration without foreign body of left ear, initial encounter: Secondary | ICD-10-CM | POA: Diagnosis not present

## 2022-11-24 DIAGNOSIS — D6869 Other thrombophilia: Secondary | ICD-10-CM | POA: Diagnosis not present

## 2022-11-24 DIAGNOSIS — R634 Abnormal weight loss: Secondary | ICD-10-CM | POA: Diagnosis not present

## 2022-11-24 DIAGNOSIS — G309 Alzheimer's disease, unspecified: Secondary | ICD-10-CM | POA: Diagnosis not present

## 2022-11-27 ENCOUNTER — Telehealth: Payer: Self-pay

## 2022-11-27 NOTE — Telephone Encounter (Signed)
Transition Care Management Unsuccessful Follow-up Telephone Call  Date of discharge and from where:  11/20/2022 [facility]  Attempts:  1st Attempt  Reason for unsuccessful TCM follow-up call:  No answer/busy  [facility]  [facility] Population [facility] Community Resource Care Guide   [email or web address]  ?? [phone]   Website: [email or web address]  [email or web address]

## 2022-11-28 ENCOUNTER — Telehealth: Payer: Self-pay

## 2022-11-28 DIAGNOSIS — N1831 Chronic kidney disease, stage 3a: Secondary | ICD-10-CM | POA: Diagnosis not present

## 2022-11-28 DIAGNOSIS — I10 Essential (primary) hypertension: Secondary | ICD-10-CM | POA: Diagnosis not present

## 2022-11-28 DIAGNOSIS — E785 Hyperlipidemia, unspecified: Secondary | ICD-10-CM | POA: Diagnosis not present

## 2022-11-28 DIAGNOSIS — I482 Chronic atrial fibrillation, unspecified: Secondary | ICD-10-CM | POA: Diagnosis not present

## 2022-11-28 DIAGNOSIS — F32A Depression, unspecified: Secondary | ICD-10-CM | POA: Diagnosis not present

## 2022-11-28 DIAGNOSIS — G309 Alzheimer's disease, unspecified: Secondary | ICD-10-CM | POA: Diagnosis not present

## 2022-11-28 DIAGNOSIS — R634 Abnormal weight loss: Secondary | ICD-10-CM | POA: Diagnosis not present

## 2022-11-28 NOTE — Telephone Encounter (Signed)
Transition Care Management Unsuccessful Follow-up Telephone Call  Date of discharge and from where:  11/20/2022 [facility]  Attempts:  2nd Attempt  Reason for unsuccessful TCM follow-up call:  Left voice message  Anaisa Radi [facility]  [facility] Population Health Community Resource Care Guide   [email or web address]  ?? [phone]   Website: [email or web address]  [email or web address]

## 2022-11-29 DIAGNOSIS — I4821 Permanent atrial fibrillation: Secondary | ICD-10-CM | POA: Diagnosis not present

## 2022-11-29 DIAGNOSIS — R634 Abnormal weight loss: Secondary | ICD-10-CM | POA: Diagnosis not present

## 2022-11-29 DIAGNOSIS — F0280 Dementia in other diseases classified elsewhere without behavioral disturbance: Secondary | ICD-10-CM | POA: Diagnosis not present

## 2022-11-29 DIAGNOSIS — S01312S Laceration without foreign body of left ear, sequela: Secondary | ICD-10-CM | POA: Diagnosis not present

## 2022-11-29 DIAGNOSIS — N1831 Chronic kidney disease, stage 3a: Secondary | ICD-10-CM | POA: Diagnosis not present

## 2022-11-29 DIAGNOSIS — Z515 Encounter for palliative care: Secondary | ICD-10-CM | POA: Diagnosis not present

## 2022-11-29 DIAGNOSIS — R627 Adult failure to thrive: Secondary | ICD-10-CM | POA: Diagnosis not present

## 2022-11-29 DIAGNOSIS — I10 Essential (primary) hypertension: Secondary | ICD-10-CM | POA: Diagnosis not present

## 2022-11-29 DIAGNOSIS — I482 Chronic atrial fibrillation, unspecified: Secondary | ICD-10-CM | POA: Diagnosis not present

## 2022-11-29 DIAGNOSIS — E785 Hyperlipidemia, unspecified: Secondary | ICD-10-CM | POA: Diagnosis not present

## 2022-11-29 DIAGNOSIS — F32A Depression, unspecified: Secondary | ICD-10-CM | POA: Diagnosis not present

## 2022-11-29 DIAGNOSIS — E7800 Pure hypercholesterolemia, unspecified: Secondary | ICD-10-CM | POA: Diagnosis not present

## 2022-11-29 DIAGNOSIS — M810 Age-related osteoporosis without current pathological fracture: Secondary | ICD-10-CM | POA: Diagnosis not present

## 2022-11-29 DIAGNOSIS — G309 Alzheimer's disease, unspecified: Secondary | ICD-10-CM | POA: Diagnosis not present

## 2022-11-29 DIAGNOSIS — Z7901 Long term (current) use of anticoagulants: Secondary | ICD-10-CM | POA: Diagnosis not present

## 2022-11-29 DIAGNOSIS — H109 Unspecified conjunctivitis: Secondary | ICD-10-CM | POA: Diagnosis not present

## 2022-11-30 DIAGNOSIS — I10 Essential (primary) hypertension: Secondary | ICD-10-CM | POA: Diagnosis not present

## 2022-11-30 DIAGNOSIS — E785 Hyperlipidemia, unspecified: Secondary | ICD-10-CM | POA: Diagnosis not present

## 2022-11-30 DIAGNOSIS — G309 Alzheimer's disease, unspecified: Secondary | ICD-10-CM | POA: Diagnosis not present

## 2022-11-30 DIAGNOSIS — N1831 Chronic kidney disease, stage 3a: Secondary | ICD-10-CM | POA: Diagnosis not present

## 2022-11-30 DIAGNOSIS — R634 Abnormal weight loss: Secondary | ICD-10-CM | POA: Diagnosis not present

## 2022-11-30 DIAGNOSIS — I482 Chronic atrial fibrillation, unspecified: Secondary | ICD-10-CM | POA: Diagnosis not present

## 2022-12-01 DIAGNOSIS — N1831 Chronic kidney disease, stage 3a: Secondary | ICD-10-CM | POA: Diagnosis not present

## 2022-12-01 DIAGNOSIS — I10 Essential (primary) hypertension: Secondary | ICD-10-CM | POA: Diagnosis not present

## 2022-12-01 DIAGNOSIS — I482 Chronic atrial fibrillation, unspecified: Secondary | ICD-10-CM | POA: Diagnosis not present

## 2022-12-01 DIAGNOSIS — E785 Hyperlipidemia, unspecified: Secondary | ICD-10-CM | POA: Diagnosis not present

## 2022-12-01 DIAGNOSIS — R634 Abnormal weight loss: Secondary | ICD-10-CM | POA: Diagnosis not present

## 2022-12-01 DIAGNOSIS — G309 Alzheimer's disease, unspecified: Secondary | ICD-10-CM | POA: Diagnosis not present

## 2022-12-02 DIAGNOSIS — N1831 Chronic kidney disease, stage 3a: Secondary | ICD-10-CM | POA: Diagnosis not present

## 2022-12-02 DIAGNOSIS — I10 Essential (primary) hypertension: Secondary | ICD-10-CM | POA: Diagnosis not present

## 2022-12-02 DIAGNOSIS — E785 Hyperlipidemia, unspecified: Secondary | ICD-10-CM | POA: Diagnosis not present

## 2022-12-02 DIAGNOSIS — I482 Chronic atrial fibrillation, unspecified: Secondary | ICD-10-CM | POA: Diagnosis not present

## 2022-12-02 DIAGNOSIS — R634 Abnormal weight loss: Secondary | ICD-10-CM | POA: Diagnosis not present

## 2022-12-02 DIAGNOSIS — G309 Alzheimer's disease, unspecified: Secondary | ICD-10-CM | POA: Diagnosis not present

## 2022-12-03 DIAGNOSIS — E785 Hyperlipidemia, unspecified: Secondary | ICD-10-CM | POA: Diagnosis not present

## 2022-12-03 DIAGNOSIS — G309 Alzheimer's disease, unspecified: Secondary | ICD-10-CM | POA: Diagnosis not present

## 2022-12-03 DIAGNOSIS — F32A Depression, unspecified: Secondary | ICD-10-CM | POA: Diagnosis not present

## 2022-12-03 DIAGNOSIS — N1831 Chronic kidney disease, stage 3a: Secondary | ICD-10-CM | POA: Diagnosis not present

## 2022-12-03 DIAGNOSIS — I10 Essential (primary) hypertension: Secondary | ICD-10-CM | POA: Diagnosis not present

## 2022-12-03 DIAGNOSIS — I482 Chronic atrial fibrillation, unspecified: Secondary | ICD-10-CM | POA: Diagnosis not present

## 2022-12-03 DIAGNOSIS — R634 Abnormal weight loss: Secondary | ICD-10-CM | POA: Diagnosis not present

## 2022-12-04 DIAGNOSIS — R634 Abnormal weight loss: Secondary | ICD-10-CM | POA: Diagnosis not present

## 2022-12-04 DIAGNOSIS — N1831 Chronic kidney disease, stage 3a: Secondary | ICD-10-CM | POA: Diagnosis not present

## 2022-12-04 DIAGNOSIS — G309 Alzheimer's disease, unspecified: Secondary | ICD-10-CM | POA: Diagnosis not present

## 2022-12-04 DIAGNOSIS — I482 Chronic atrial fibrillation, unspecified: Secondary | ICD-10-CM | POA: Diagnosis not present

## 2022-12-04 DIAGNOSIS — I10 Essential (primary) hypertension: Secondary | ICD-10-CM | POA: Diagnosis not present

## 2022-12-04 DIAGNOSIS — E785 Hyperlipidemia, unspecified: Secondary | ICD-10-CM | POA: Diagnosis not present

## 2022-12-05 DIAGNOSIS — N1831 Chronic kidney disease, stage 3a: Secondary | ICD-10-CM | POA: Diagnosis not present

## 2022-12-05 DIAGNOSIS — I482 Chronic atrial fibrillation, unspecified: Secondary | ICD-10-CM | POA: Diagnosis not present

## 2022-12-05 DIAGNOSIS — R634 Abnormal weight loss: Secondary | ICD-10-CM | POA: Diagnosis not present

## 2022-12-05 DIAGNOSIS — E785 Hyperlipidemia, unspecified: Secondary | ICD-10-CM | POA: Diagnosis not present

## 2022-12-05 DIAGNOSIS — G309 Alzheimer's disease, unspecified: Secondary | ICD-10-CM | POA: Diagnosis not present

## 2022-12-05 DIAGNOSIS — I10 Essential (primary) hypertension: Secondary | ICD-10-CM | POA: Diagnosis not present

## 2022-12-06 DIAGNOSIS — R634 Abnormal weight loss: Secondary | ICD-10-CM | POA: Diagnosis not present

## 2022-12-06 DIAGNOSIS — I10 Essential (primary) hypertension: Secondary | ICD-10-CM | POA: Diagnosis not present

## 2022-12-06 DIAGNOSIS — E785 Hyperlipidemia, unspecified: Secondary | ICD-10-CM | POA: Diagnosis not present

## 2022-12-06 DIAGNOSIS — N1831 Chronic kidney disease, stage 3a: Secondary | ICD-10-CM | POA: Diagnosis not present

## 2022-12-06 DIAGNOSIS — I482 Chronic atrial fibrillation, unspecified: Secondary | ICD-10-CM | POA: Diagnosis not present

## 2022-12-06 DIAGNOSIS — G309 Alzheimer's disease, unspecified: Secondary | ICD-10-CM | POA: Diagnosis not present

## 2022-12-07 DIAGNOSIS — I482 Chronic atrial fibrillation, unspecified: Secondary | ICD-10-CM | POA: Diagnosis not present

## 2022-12-07 DIAGNOSIS — N1831 Chronic kidney disease, stage 3a: Secondary | ICD-10-CM | POA: Diagnosis not present

## 2022-12-07 DIAGNOSIS — I10 Essential (primary) hypertension: Secondary | ICD-10-CM | POA: Diagnosis not present

## 2022-12-07 DIAGNOSIS — E785 Hyperlipidemia, unspecified: Secondary | ICD-10-CM | POA: Diagnosis not present

## 2022-12-07 DIAGNOSIS — G309 Alzheimer's disease, unspecified: Secondary | ICD-10-CM | POA: Diagnosis not present

## 2022-12-07 DIAGNOSIS — R634 Abnormal weight loss: Secondary | ICD-10-CM | POA: Diagnosis not present

## 2022-12-08 DIAGNOSIS — I482 Chronic atrial fibrillation, unspecified: Secondary | ICD-10-CM | POA: Diagnosis not present

## 2022-12-08 DIAGNOSIS — G309 Alzheimer's disease, unspecified: Secondary | ICD-10-CM | POA: Diagnosis not present

## 2022-12-08 DIAGNOSIS — I10 Essential (primary) hypertension: Secondary | ICD-10-CM | POA: Diagnosis not present

## 2022-12-08 DIAGNOSIS — E785 Hyperlipidemia, unspecified: Secondary | ICD-10-CM | POA: Diagnosis not present

## 2022-12-08 DIAGNOSIS — N1831 Chronic kidney disease, stage 3a: Secondary | ICD-10-CM | POA: Diagnosis not present

## 2022-12-08 DIAGNOSIS — R634 Abnormal weight loss: Secondary | ICD-10-CM | POA: Diagnosis not present

## 2022-12-09 DIAGNOSIS — G309 Alzheimer's disease, unspecified: Secondary | ICD-10-CM | POA: Diagnosis not present

## 2022-12-09 DIAGNOSIS — R634 Abnormal weight loss: Secondary | ICD-10-CM | POA: Diagnosis not present

## 2022-12-09 DIAGNOSIS — N1831 Chronic kidney disease, stage 3a: Secondary | ICD-10-CM | POA: Diagnosis not present

## 2022-12-09 DIAGNOSIS — E785 Hyperlipidemia, unspecified: Secondary | ICD-10-CM | POA: Diagnosis not present

## 2022-12-09 DIAGNOSIS — I10 Essential (primary) hypertension: Secondary | ICD-10-CM | POA: Diagnosis not present

## 2022-12-09 DIAGNOSIS — I482 Chronic atrial fibrillation, unspecified: Secondary | ICD-10-CM | POA: Diagnosis not present

## 2022-12-10 DIAGNOSIS — I482 Chronic atrial fibrillation, unspecified: Secondary | ICD-10-CM | POA: Diagnosis not present

## 2022-12-10 DIAGNOSIS — R634 Abnormal weight loss: Secondary | ICD-10-CM | POA: Diagnosis not present

## 2022-12-10 DIAGNOSIS — N1831 Chronic kidney disease, stage 3a: Secondary | ICD-10-CM | POA: Diagnosis not present

## 2022-12-10 DIAGNOSIS — I10 Essential (primary) hypertension: Secondary | ICD-10-CM | POA: Diagnosis not present

## 2022-12-10 DIAGNOSIS — E785 Hyperlipidemia, unspecified: Secondary | ICD-10-CM | POA: Diagnosis not present

## 2022-12-10 DIAGNOSIS — G309 Alzheimer's disease, unspecified: Secondary | ICD-10-CM | POA: Diagnosis not present

## 2022-12-11 DIAGNOSIS — I10 Essential (primary) hypertension: Secondary | ICD-10-CM | POA: Diagnosis not present

## 2022-12-11 DIAGNOSIS — I482 Chronic atrial fibrillation, unspecified: Secondary | ICD-10-CM | POA: Diagnosis not present

## 2022-12-11 DIAGNOSIS — N1831 Chronic kidney disease, stage 3a: Secondary | ICD-10-CM | POA: Diagnosis not present

## 2022-12-11 DIAGNOSIS — R634 Abnormal weight loss: Secondary | ICD-10-CM | POA: Diagnosis not present

## 2022-12-11 DIAGNOSIS — E785 Hyperlipidemia, unspecified: Secondary | ICD-10-CM | POA: Diagnosis not present

## 2022-12-11 DIAGNOSIS — G309 Alzheimer's disease, unspecified: Secondary | ICD-10-CM | POA: Diagnosis not present

## 2022-12-12 DIAGNOSIS — E785 Hyperlipidemia, unspecified: Secondary | ICD-10-CM | POA: Diagnosis not present

## 2022-12-12 DIAGNOSIS — I10 Essential (primary) hypertension: Secondary | ICD-10-CM | POA: Diagnosis not present

## 2022-12-12 DIAGNOSIS — R634 Abnormal weight loss: Secondary | ICD-10-CM | POA: Diagnosis not present

## 2022-12-12 DIAGNOSIS — N1831 Chronic kidney disease, stage 3a: Secondary | ICD-10-CM | POA: Diagnosis not present

## 2022-12-12 DIAGNOSIS — G309 Alzheimer's disease, unspecified: Secondary | ICD-10-CM | POA: Diagnosis not present

## 2022-12-12 DIAGNOSIS — I482 Chronic atrial fibrillation, unspecified: Secondary | ICD-10-CM | POA: Diagnosis not present

## 2022-12-13 DIAGNOSIS — I482 Chronic atrial fibrillation, unspecified: Secondary | ICD-10-CM | POA: Diagnosis not present

## 2022-12-13 DIAGNOSIS — N1831 Chronic kidney disease, stage 3a: Secondary | ICD-10-CM | POA: Diagnosis not present

## 2022-12-13 DIAGNOSIS — I10 Essential (primary) hypertension: Secondary | ICD-10-CM | POA: Diagnosis not present

## 2022-12-13 DIAGNOSIS — G309 Alzheimer's disease, unspecified: Secondary | ICD-10-CM | POA: Diagnosis not present

## 2022-12-13 DIAGNOSIS — E785 Hyperlipidemia, unspecified: Secondary | ICD-10-CM | POA: Diagnosis not present

## 2022-12-13 DIAGNOSIS — R634 Abnormal weight loss: Secondary | ICD-10-CM | POA: Diagnosis not present

## 2022-12-14 DIAGNOSIS — I482 Chronic atrial fibrillation, unspecified: Secondary | ICD-10-CM | POA: Diagnosis not present

## 2022-12-14 DIAGNOSIS — E785 Hyperlipidemia, unspecified: Secondary | ICD-10-CM | POA: Diagnosis not present

## 2022-12-14 DIAGNOSIS — R634 Abnormal weight loss: Secondary | ICD-10-CM | POA: Diagnosis not present

## 2022-12-14 DIAGNOSIS — G309 Alzheimer's disease, unspecified: Secondary | ICD-10-CM | POA: Diagnosis not present

## 2022-12-14 DIAGNOSIS — N1831 Chronic kidney disease, stage 3a: Secondary | ICD-10-CM | POA: Diagnosis not present

## 2022-12-14 DIAGNOSIS — I10 Essential (primary) hypertension: Secondary | ICD-10-CM | POA: Diagnosis not present

## 2022-12-15 DIAGNOSIS — N1831 Chronic kidney disease, stage 3a: Secondary | ICD-10-CM | POA: Diagnosis not present

## 2022-12-15 DIAGNOSIS — I10 Essential (primary) hypertension: Secondary | ICD-10-CM | POA: Diagnosis not present

## 2022-12-15 DIAGNOSIS — G309 Alzheimer's disease, unspecified: Secondary | ICD-10-CM | POA: Diagnosis not present

## 2022-12-15 DIAGNOSIS — I482 Chronic atrial fibrillation, unspecified: Secondary | ICD-10-CM | POA: Diagnosis not present

## 2022-12-15 DIAGNOSIS — R634 Abnormal weight loss: Secondary | ICD-10-CM | POA: Diagnosis not present

## 2022-12-15 DIAGNOSIS — E785 Hyperlipidemia, unspecified: Secondary | ICD-10-CM | POA: Diagnosis not present

## 2022-12-16 DIAGNOSIS — G309 Alzheimer's disease, unspecified: Secondary | ICD-10-CM | POA: Diagnosis not present

## 2022-12-16 DIAGNOSIS — I10 Essential (primary) hypertension: Secondary | ICD-10-CM | POA: Diagnosis not present

## 2022-12-16 DIAGNOSIS — E785 Hyperlipidemia, unspecified: Secondary | ICD-10-CM | POA: Diagnosis not present

## 2022-12-16 DIAGNOSIS — I482 Chronic atrial fibrillation, unspecified: Secondary | ICD-10-CM | POA: Diagnosis not present

## 2022-12-16 DIAGNOSIS — N1831 Chronic kidney disease, stage 3a: Secondary | ICD-10-CM | POA: Diagnosis not present

## 2022-12-16 DIAGNOSIS — R634 Abnormal weight loss: Secondary | ICD-10-CM | POA: Diagnosis not present

## 2022-12-17 DIAGNOSIS — N1831 Chronic kidney disease, stage 3a: Secondary | ICD-10-CM | POA: Diagnosis not present

## 2022-12-17 DIAGNOSIS — E785 Hyperlipidemia, unspecified: Secondary | ICD-10-CM | POA: Diagnosis not present

## 2022-12-17 DIAGNOSIS — R634 Abnormal weight loss: Secondary | ICD-10-CM | POA: Diagnosis not present

## 2022-12-17 DIAGNOSIS — G309 Alzheimer's disease, unspecified: Secondary | ICD-10-CM | POA: Diagnosis not present

## 2022-12-17 DIAGNOSIS — I482 Chronic atrial fibrillation, unspecified: Secondary | ICD-10-CM | POA: Diagnosis not present

## 2022-12-17 DIAGNOSIS — I10 Essential (primary) hypertension: Secondary | ICD-10-CM | POA: Diagnosis not present

## 2022-12-18 DIAGNOSIS — I10 Essential (primary) hypertension: Secondary | ICD-10-CM | POA: Diagnosis not present

## 2022-12-18 DIAGNOSIS — I482 Chronic atrial fibrillation, unspecified: Secondary | ICD-10-CM | POA: Diagnosis not present

## 2022-12-18 DIAGNOSIS — E785 Hyperlipidemia, unspecified: Secondary | ICD-10-CM | POA: Diagnosis not present

## 2022-12-18 DIAGNOSIS — R634 Abnormal weight loss: Secondary | ICD-10-CM | POA: Diagnosis not present

## 2022-12-18 DIAGNOSIS — N1831 Chronic kidney disease, stage 3a: Secondary | ICD-10-CM | POA: Diagnosis not present

## 2022-12-18 DIAGNOSIS — G309 Alzheimer's disease, unspecified: Secondary | ICD-10-CM | POA: Diagnosis not present

## 2022-12-19 DIAGNOSIS — E785 Hyperlipidemia, unspecified: Secondary | ICD-10-CM | POA: Diagnosis not present

## 2022-12-19 DIAGNOSIS — R634 Abnormal weight loss: Secondary | ICD-10-CM | POA: Diagnosis not present

## 2022-12-19 DIAGNOSIS — N1831 Chronic kidney disease, stage 3a: Secondary | ICD-10-CM | POA: Diagnosis not present

## 2022-12-19 DIAGNOSIS — I482 Chronic atrial fibrillation, unspecified: Secondary | ICD-10-CM | POA: Diagnosis not present

## 2022-12-19 DIAGNOSIS — G309 Alzheimer's disease, unspecified: Secondary | ICD-10-CM | POA: Diagnosis not present

## 2022-12-19 DIAGNOSIS — I10 Essential (primary) hypertension: Secondary | ICD-10-CM | POA: Diagnosis not present

## 2022-12-20 DIAGNOSIS — E785 Hyperlipidemia, unspecified: Secondary | ICD-10-CM | POA: Diagnosis not present

## 2022-12-20 DIAGNOSIS — I482 Chronic atrial fibrillation, unspecified: Secondary | ICD-10-CM | POA: Diagnosis not present

## 2022-12-20 DIAGNOSIS — I10 Essential (primary) hypertension: Secondary | ICD-10-CM | POA: Diagnosis not present

## 2022-12-20 DIAGNOSIS — R634 Abnormal weight loss: Secondary | ICD-10-CM | POA: Diagnosis not present

## 2022-12-20 DIAGNOSIS — N1831 Chronic kidney disease, stage 3a: Secondary | ICD-10-CM | POA: Diagnosis not present

## 2022-12-20 DIAGNOSIS — G309 Alzheimer's disease, unspecified: Secondary | ICD-10-CM | POA: Diagnosis not present

## 2022-12-21 DIAGNOSIS — I482 Chronic atrial fibrillation, unspecified: Secondary | ICD-10-CM | POA: Diagnosis not present

## 2022-12-21 DIAGNOSIS — I10 Essential (primary) hypertension: Secondary | ICD-10-CM | POA: Diagnosis not present

## 2022-12-21 DIAGNOSIS — N1831 Chronic kidney disease, stage 3a: Secondary | ICD-10-CM | POA: Diagnosis not present

## 2022-12-21 DIAGNOSIS — G309 Alzheimer's disease, unspecified: Secondary | ICD-10-CM | POA: Diagnosis not present

## 2022-12-21 DIAGNOSIS — E785 Hyperlipidemia, unspecified: Secondary | ICD-10-CM | POA: Diagnosis not present

## 2022-12-21 DIAGNOSIS — R634 Abnormal weight loss: Secondary | ICD-10-CM | POA: Diagnosis not present

## 2022-12-22 DIAGNOSIS — E785 Hyperlipidemia, unspecified: Secondary | ICD-10-CM | POA: Diagnosis not present

## 2022-12-22 DIAGNOSIS — I10 Essential (primary) hypertension: Secondary | ICD-10-CM | POA: Diagnosis not present

## 2022-12-22 DIAGNOSIS — N1831 Chronic kidney disease, stage 3a: Secondary | ICD-10-CM | POA: Diagnosis not present

## 2022-12-22 DIAGNOSIS — R634 Abnormal weight loss: Secondary | ICD-10-CM | POA: Diagnosis not present

## 2022-12-22 DIAGNOSIS — G309 Alzheimer's disease, unspecified: Secondary | ICD-10-CM | POA: Diagnosis not present

## 2022-12-22 DIAGNOSIS — I482 Chronic atrial fibrillation, unspecified: Secondary | ICD-10-CM | POA: Diagnosis not present

## 2022-12-23 DIAGNOSIS — R634 Abnormal weight loss: Secondary | ICD-10-CM | POA: Diagnosis not present

## 2022-12-23 DIAGNOSIS — N1831 Chronic kidney disease, stage 3a: Secondary | ICD-10-CM | POA: Diagnosis not present

## 2022-12-23 DIAGNOSIS — I10 Essential (primary) hypertension: Secondary | ICD-10-CM | POA: Diagnosis not present

## 2022-12-23 DIAGNOSIS — I482 Chronic atrial fibrillation, unspecified: Secondary | ICD-10-CM | POA: Diagnosis not present

## 2022-12-23 DIAGNOSIS — G309 Alzheimer's disease, unspecified: Secondary | ICD-10-CM | POA: Diagnosis not present

## 2022-12-23 DIAGNOSIS — E785 Hyperlipidemia, unspecified: Secondary | ICD-10-CM | POA: Diagnosis not present

## 2022-12-24 DIAGNOSIS — N1831 Chronic kidney disease, stage 3a: Secondary | ICD-10-CM | POA: Diagnosis not present

## 2022-12-24 DIAGNOSIS — R634 Abnormal weight loss: Secondary | ICD-10-CM | POA: Diagnosis not present

## 2022-12-24 DIAGNOSIS — I10 Essential (primary) hypertension: Secondary | ICD-10-CM | POA: Diagnosis not present

## 2022-12-24 DIAGNOSIS — G309 Alzheimer's disease, unspecified: Secondary | ICD-10-CM | POA: Diagnosis not present

## 2022-12-24 DIAGNOSIS — I482 Chronic atrial fibrillation, unspecified: Secondary | ICD-10-CM | POA: Diagnosis not present

## 2022-12-24 DIAGNOSIS — E785 Hyperlipidemia, unspecified: Secondary | ICD-10-CM | POA: Diagnosis not present

## 2022-12-25 DIAGNOSIS — R634 Abnormal weight loss: Secondary | ICD-10-CM | POA: Diagnosis not present

## 2022-12-25 DIAGNOSIS — I482 Chronic atrial fibrillation, unspecified: Secondary | ICD-10-CM | POA: Diagnosis not present

## 2022-12-25 DIAGNOSIS — G309 Alzheimer's disease, unspecified: Secondary | ICD-10-CM | POA: Diagnosis not present

## 2022-12-25 DIAGNOSIS — I10 Essential (primary) hypertension: Secondary | ICD-10-CM | POA: Diagnosis not present

## 2022-12-25 DIAGNOSIS — E785 Hyperlipidemia, unspecified: Secondary | ICD-10-CM | POA: Diagnosis not present

## 2022-12-25 DIAGNOSIS — N1831 Chronic kidney disease, stage 3a: Secondary | ICD-10-CM | POA: Diagnosis not present

## 2022-12-26 DIAGNOSIS — E785 Hyperlipidemia, unspecified: Secondary | ICD-10-CM | POA: Diagnosis not present

## 2022-12-26 DIAGNOSIS — I482 Chronic atrial fibrillation, unspecified: Secondary | ICD-10-CM | POA: Diagnosis not present

## 2022-12-26 DIAGNOSIS — I10 Essential (primary) hypertension: Secondary | ICD-10-CM | POA: Diagnosis not present

## 2022-12-26 DIAGNOSIS — R634 Abnormal weight loss: Secondary | ICD-10-CM | POA: Diagnosis not present

## 2022-12-26 DIAGNOSIS — N1831 Chronic kidney disease, stage 3a: Secondary | ICD-10-CM | POA: Diagnosis not present

## 2022-12-26 DIAGNOSIS — G309 Alzheimer's disease, unspecified: Secondary | ICD-10-CM | POA: Diagnosis not present

## 2022-12-27 DIAGNOSIS — I482 Chronic atrial fibrillation, unspecified: Secondary | ICD-10-CM | POA: Diagnosis not present

## 2022-12-27 DIAGNOSIS — E785 Hyperlipidemia, unspecified: Secondary | ICD-10-CM | POA: Diagnosis not present

## 2022-12-27 DIAGNOSIS — G309 Alzheimer's disease, unspecified: Secondary | ICD-10-CM | POA: Diagnosis not present

## 2022-12-27 DIAGNOSIS — I10 Essential (primary) hypertension: Secondary | ICD-10-CM | POA: Diagnosis not present

## 2022-12-27 DIAGNOSIS — N1831 Chronic kidney disease, stage 3a: Secondary | ICD-10-CM | POA: Diagnosis not present

## 2022-12-27 DIAGNOSIS — R634 Abnormal weight loss: Secondary | ICD-10-CM | POA: Diagnosis not present

## 2022-12-28 DIAGNOSIS — N1831 Chronic kidney disease, stage 3a: Secondary | ICD-10-CM | POA: Diagnosis not present

## 2022-12-28 DIAGNOSIS — G309 Alzheimer's disease, unspecified: Secondary | ICD-10-CM | POA: Diagnosis not present

## 2022-12-28 DIAGNOSIS — E785 Hyperlipidemia, unspecified: Secondary | ICD-10-CM | POA: Diagnosis not present

## 2022-12-28 DIAGNOSIS — I10 Essential (primary) hypertension: Secondary | ICD-10-CM | POA: Diagnosis not present

## 2022-12-28 DIAGNOSIS — R634 Abnormal weight loss: Secondary | ICD-10-CM | POA: Diagnosis not present

## 2022-12-28 DIAGNOSIS — I482 Chronic atrial fibrillation, unspecified: Secondary | ICD-10-CM | POA: Diagnosis not present

## 2022-12-29 DIAGNOSIS — I482 Chronic atrial fibrillation, unspecified: Secondary | ICD-10-CM | POA: Diagnosis not present

## 2022-12-29 DIAGNOSIS — G309 Alzheimer's disease, unspecified: Secondary | ICD-10-CM | POA: Diagnosis not present

## 2022-12-29 DIAGNOSIS — R634 Abnormal weight loss: Secondary | ICD-10-CM | POA: Diagnosis not present

## 2022-12-29 DIAGNOSIS — N1831 Chronic kidney disease, stage 3a: Secondary | ICD-10-CM | POA: Diagnosis not present

## 2022-12-29 DIAGNOSIS — I10 Essential (primary) hypertension: Secondary | ICD-10-CM | POA: Diagnosis not present

## 2022-12-29 DIAGNOSIS — E785 Hyperlipidemia, unspecified: Secondary | ICD-10-CM | POA: Diagnosis not present

## 2022-12-30 DIAGNOSIS — G309 Alzheimer's disease, unspecified: Secondary | ICD-10-CM | POA: Diagnosis not present

## 2022-12-30 DIAGNOSIS — R634 Abnormal weight loss: Secondary | ICD-10-CM | POA: Diagnosis not present

## 2022-12-30 DIAGNOSIS — E785 Hyperlipidemia, unspecified: Secondary | ICD-10-CM | POA: Diagnosis not present

## 2022-12-30 DIAGNOSIS — I10 Essential (primary) hypertension: Secondary | ICD-10-CM | POA: Diagnosis not present

## 2022-12-30 DIAGNOSIS — N1831 Chronic kidney disease, stage 3a: Secondary | ICD-10-CM | POA: Diagnosis not present

## 2022-12-30 DIAGNOSIS — I482 Chronic atrial fibrillation, unspecified: Secondary | ICD-10-CM | POA: Diagnosis not present

## 2022-12-31 DIAGNOSIS — G309 Alzheimer's disease, unspecified: Secondary | ICD-10-CM | POA: Diagnosis not present

## 2022-12-31 DIAGNOSIS — I482 Chronic atrial fibrillation, unspecified: Secondary | ICD-10-CM | POA: Diagnosis not present

## 2022-12-31 DIAGNOSIS — R634 Abnormal weight loss: Secondary | ICD-10-CM | POA: Diagnosis not present

## 2022-12-31 DIAGNOSIS — N1831 Chronic kidney disease, stage 3a: Secondary | ICD-10-CM | POA: Diagnosis not present

## 2022-12-31 DIAGNOSIS — E785 Hyperlipidemia, unspecified: Secondary | ICD-10-CM | POA: Diagnosis not present

## 2022-12-31 DIAGNOSIS — I10 Essential (primary) hypertension: Secondary | ICD-10-CM | POA: Diagnosis not present

## 2023-01-01 DIAGNOSIS — N1831 Chronic kidney disease, stage 3a: Secondary | ICD-10-CM | POA: Diagnosis not present

## 2023-01-01 DIAGNOSIS — R634 Abnormal weight loss: Secondary | ICD-10-CM | POA: Diagnosis not present

## 2023-01-01 DIAGNOSIS — E785 Hyperlipidemia, unspecified: Secondary | ICD-10-CM | POA: Diagnosis not present

## 2023-01-01 DIAGNOSIS — G309 Alzheimer's disease, unspecified: Secondary | ICD-10-CM | POA: Diagnosis not present

## 2023-01-01 DIAGNOSIS — I482 Chronic atrial fibrillation, unspecified: Secondary | ICD-10-CM | POA: Diagnosis not present

## 2023-01-01 DIAGNOSIS — I10 Essential (primary) hypertension: Secondary | ICD-10-CM | POA: Diagnosis not present

## 2023-01-02 DIAGNOSIS — R634 Abnormal weight loss: Secondary | ICD-10-CM | POA: Diagnosis not present

## 2023-01-02 DIAGNOSIS — F32A Depression, unspecified: Secondary | ICD-10-CM | POA: Diagnosis not present

## 2023-01-02 DIAGNOSIS — G309 Alzheimer's disease, unspecified: Secondary | ICD-10-CM | POA: Diagnosis not present

## 2023-01-02 DIAGNOSIS — E785 Hyperlipidemia, unspecified: Secondary | ICD-10-CM | POA: Diagnosis not present

## 2023-01-02 DIAGNOSIS — N1831 Chronic kidney disease, stage 3a: Secondary | ICD-10-CM | POA: Diagnosis not present

## 2023-01-02 DIAGNOSIS — I10 Essential (primary) hypertension: Secondary | ICD-10-CM | POA: Diagnosis not present

## 2023-01-02 DIAGNOSIS — I482 Chronic atrial fibrillation, unspecified: Secondary | ICD-10-CM | POA: Diagnosis not present

## 2023-01-03 DIAGNOSIS — N1831 Chronic kidney disease, stage 3a: Secondary | ICD-10-CM | POA: Diagnosis not present

## 2023-01-03 DIAGNOSIS — E785 Hyperlipidemia, unspecified: Secondary | ICD-10-CM | POA: Diagnosis not present

## 2023-01-03 DIAGNOSIS — R634 Abnormal weight loss: Secondary | ICD-10-CM | POA: Diagnosis not present

## 2023-01-03 DIAGNOSIS — I482 Chronic atrial fibrillation, unspecified: Secondary | ICD-10-CM | POA: Diagnosis not present

## 2023-01-03 DIAGNOSIS — G309 Alzheimer's disease, unspecified: Secondary | ICD-10-CM | POA: Diagnosis not present

## 2023-01-03 DIAGNOSIS — I10 Essential (primary) hypertension: Secondary | ICD-10-CM | POA: Diagnosis not present

## 2023-01-04 DIAGNOSIS — E785 Hyperlipidemia, unspecified: Secondary | ICD-10-CM | POA: Diagnosis not present

## 2023-01-04 DIAGNOSIS — I482 Chronic atrial fibrillation, unspecified: Secondary | ICD-10-CM | POA: Diagnosis not present

## 2023-01-04 DIAGNOSIS — G309 Alzheimer's disease, unspecified: Secondary | ICD-10-CM | POA: Diagnosis not present

## 2023-01-04 DIAGNOSIS — I10 Essential (primary) hypertension: Secondary | ICD-10-CM | POA: Diagnosis not present

## 2023-01-04 DIAGNOSIS — R634 Abnormal weight loss: Secondary | ICD-10-CM | POA: Diagnosis not present

## 2023-01-04 DIAGNOSIS — N1831 Chronic kidney disease, stage 3a: Secondary | ICD-10-CM | POA: Diagnosis not present

## 2023-01-05 DIAGNOSIS — R634 Abnormal weight loss: Secondary | ICD-10-CM | POA: Diagnosis not present

## 2023-01-05 DIAGNOSIS — I10 Essential (primary) hypertension: Secondary | ICD-10-CM | POA: Diagnosis not present

## 2023-01-05 DIAGNOSIS — I482 Chronic atrial fibrillation, unspecified: Secondary | ICD-10-CM | POA: Diagnosis not present

## 2023-01-05 DIAGNOSIS — G309 Alzheimer's disease, unspecified: Secondary | ICD-10-CM | POA: Diagnosis not present

## 2023-01-05 DIAGNOSIS — E785 Hyperlipidemia, unspecified: Secondary | ICD-10-CM | POA: Diagnosis not present

## 2023-01-05 DIAGNOSIS — N1831 Chronic kidney disease, stage 3a: Secondary | ICD-10-CM | POA: Diagnosis not present

## 2023-01-06 DIAGNOSIS — R634 Abnormal weight loss: Secondary | ICD-10-CM | POA: Diagnosis not present

## 2023-01-06 DIAGNOSIS — G309 Alzheimer's disease, unspecified: Secondary | ICD-10-CM | POA: Diagnosis not present

## 2023-01-06 DIAGNOSIS — N1831 Chronic kidney disease, stage 3a: Secondary | ICD-10-CM | POA: Diagnosis not present

## 2023-01-06 DIAGNOSIS — I482 Chronic atrial fibrillation, unspecified: Secondary | ICD-10-CM | POA: Diagnosis not present

## 2023-01-06 DIAGNOSIS — E785 Hyperlipidemia, unspecified: Secondary | ICD-10-CM | POA: Diagnosis not present

## 2023-01-06 DIAGNOSIS — I10 Essential (primary) hypertension: Secondary | ICD-10-CM | POA: Diagnosis not present

## 2023-01-07 DIAGNOSIS — I482 Chronic atrial fibrillation, unspecified: Secondary | ICD-10-CM | POA: Diagnosis not present

## 2023-01-07 DIAGNOSIS — N1831 Chronic kidney disease, stage 3a: Secondary | ICD-10-CM | POA: Diagnosis not present

## 2023-01-07 DIAGNOSIS — R634 Abnormal weight loss: Secondary | ICD-10-CM | POA: Diagnosis not present

## 2023-01-07 DIAGNOSIS — G309 Alzheimer's disease, unspecified: Secondary | ICD-10-CM | POA: Diagnosis not present

## 2023-01-07 DIAGNOSIS — E785 Hyperlipidemia, unspecified: Secondary | ICD-10-CM | POA: Diagnosis not present

## 2023-01-07 DIAGNOSIS — I10 Essential (primary) hypertension: Secondary | ICD-10-CM | POA: Diagnosis not present

## 2023-01-08 DIAGNOSIS — G309 Alzheimer's disease, unspecified: Secondary | ICD-10-CM | POA: Diagnosis not present

## 2023-01-08 DIAGNOSIS — I482 Chronic atrial fibrillation, unspecified: Secondary | ICD-10-CM | POA: Diagnosis not present

## 2023-01-08 DIAGNOSIS — R634 Abnormal weight loss: Secondary | ICD-10-CM | POA: Diagnosis not present

## 2023-01-08 DIAGNOSIS — N1831 Chronic kidney disease, stage 3a: Secondary | ICD-10-CM | POA: Diagnosis not present

## 2023-01-08 DIAGNOSIS — E785 Hyperlipidemia, unspecified: Secondary | ICD-10-CM | POA: Diagnosis not present

## 2023-01-08 DIAGNOSIS — I10 Essential (primary) hypertension: Secondary | ICD-10-CM | POA: Diagnosis not present

## 2023-01-09 DIAGNOSIS — G309 Alzheimer's disease, unspecified: Secondary | ICD-10-CM | POA: Diagnosis not present

## 2023-01-09 DIAGNOSIS — R634 Abnormal weight loss: Secondary | ICD-10-CM | POA: Diagnosis not present

## 2023-01-09 DIAGNOSIS — I10 Essential (primary) hypertension: Secondary | ICD-10-CM | POA: Diagnosis not present

## 2023-01-09 DIAGNOSIS — E785 Hyperlipidemia, unspecified: Secondary | ICD-10-CM | POA: Diagnosis not present

## 2023-01-09 DIAGNOSIS — N1831 Chronic kidney disease, stage 3a: Secondary | ICD-10-CM | POA: Diagnosis not present

## 2023-01-09 DIAGNOSIS — I482 Chronic atrial fibrillation, unspecified: Secondary | ICD-10-CM | POA: Diagnosis not present

## 2023-01-10 DIAGNOSIS — E785 Hyperlipidemia, unspecified: Secondary | ICD-10-CM | POA: Diagnosis not present

## 2023-01-10 DIAGNOSIS — I482 Chronic atrial fibrillation, unspecified: Secondary | ICD-10-CM | POA: Diagnosis not present

## 2023-01-10 DIAGNOSIS — N1831 Chronic kidney disease, stage 3a: Secondary | ICD-10-CM | POA: Diagnosis not present

## 2023-01-10 DIAGNOSIS — G309 Alzheimer's disease, unspecified: Secondary | ICD-10-CM | POA: Diagnosis not present

## 2023-01-10 DIAGNOSIS — I10 Essential (primary) hypertension: Secondary | ICD-10-CM | POA: Diagnosis not present

## 2023-01-10 DIAGNOSIS — R634 Abnormal weight loss: Secondary | ICD-10-CM | POA: Diagnosis not present

## 2023-01-11 DIAGNOSIS — N1831 Chronic kidney disease, stage 3a: Secondary | ICD-10-CM | POA: Diagnosis not present

## 2023-01-11 DIAGNOSIS — E785 Hyperlipidemia, unspecified: Secondary | ICD-10-CM | POA: Diagnosis not present

## 2023-01-11 DIAGNOSIS — G309 Alzheimer's disease, unspecified: Secondary | ICD-10-CM | POA: Diagnosis not present

## 2023-01-11 DIAGNOSIS — I482 Chronic atrial fibrillation, unspecified: Secondary | ICD-10-CM | POA: Diagnosis not present

## 2023-01-11 DIAGNOSIS — I10 Essential (primary) hypertension: Secondary | ICD-10-CM | POA: Diagnosis not present

## 2023-01-11 DIAGNOSIS — R634 Abnormal weight loss: Secondary | ICD-10-CM | POA: Diagnosis not present

## 2023-01-12 DIAGNOSIS — R634 Abnormal weight loss: Secondary | ICD-10-CM | POA: Diagnosis not present

## 2023-01-12 DIAGNOSIS — E785 Hyperlipidemia, unspecified: Secondary | ICD-10-CM | POA: Diagnosis not present

## 2023-01-12 DIAGNOSIS — I10 Essential (primary) hypertension: Secondary | ICD-10-CM | POA: Diagnosis not present

## 2023-01-12 DIAGNOSIS — N1831 Chronic kidney disease, stage 3a: Secondary | ICD-10-CM | POA: Diagnosis not present

## 2023-01-12 DIAGNOSIS — I482 Chronic atrial fibrillation, unspecified: Secondary | ICD-10-CM | POA: Diagnosis not present

## 2023-01-12 DIAGNOSIS — G309 Alzheimer's disease, unspecified: Secondary | ICD-10-CM | POA: Diagnosis not present

## 2023-01-13 DIAGNOSIS — E785 Hyperlipidemia, unspecified: Secondary | ICD-10-CM | POA: Diagnosis not present

## 2023-01-13 DIAGNOSIS — R634 Abnormal weight loss: Secondary | ICD-10-CM | POA: Diagnosis not present

## 2023-01-13 DIAGNOSIS — I482 Chronic atrial fibrillation, unspecified: Secondary | ICD-10-CM | POA: Diagnosis not present

## 2023-01-13 DIAGNOSIS — N1831 Chronic kidney disease, stage 3a: Secondary | ICD-10-CM | POA: Diagnosis not present

## 2023-01-13 DIAGNOSIS — G309 Alzheimer's disease, unspecified: Secondary | ICD-10-CM | POA: Diagnosis not present

## 2023-01-13 DIAGNOSIS — I10 Essential (primary) hypertension: Secondary | ICD-10-CM | POA: Diagnosis not present

## 2023-01-14 DIAGNOSIS — I482 Chronic atrial fibrillation, unspecified: Secondary | ICD-10-CM | POA: Diagnosis not present

## 2023-01-14 DIAGNOSIS — N1831 Chronic kidney disease, stage 3a: Secondary | ICD-10-CM | POA: Diagnosis not present

## 2023-01-14 DIAGNOSIS — I10 Essential (primary) hypertension: Secondary | ICD-10-CM | POA: Diagnosis not present

## 2023-01-14 DIAGNOSIS — R634 Abnormal weight loss: Secondary | ICD-10-CM | POA: Diagnosis not present

## 2023-01-14 DIAGNOSIS — E785 Hyperlipidemia, unspecified: Secondary | ICD-10-CM | POA: Diagnosis not present

## 2023-01-14 DIAGNOSIS — G309 Alzheimer's disease, unspecified: Secondary | ICD-10-CM | POA: Diagnosis not present

## 2023-01-15 DIAGNOSIS — R634 Abnormal weight loss: Secondary | ICD-10-CM | POA: Diagnosis not present

## 2023-01-15 DIAGNOSIS — I10 Essential (primary) hypertension: Secondary | ICD-10-CM | POA: Diagnosis not present

## 2023-01-15 DIAGNOSIS — E785 Hyperlipidemia, unspecified: Secondary | ICD-10-CM | POA: Diagnosis not present

## 2023-01-15 DIAGNOSIS — N1831 Chronic kidney disease, stage 3a: Secondary | ICD-10-CM | POA: Diagnosis not present

## 2023-01-15 DIAGNOSIS — I482 Chronic atrial fibrillation, unspecified: Secondary | ICD-10-CM | POA: Diagnosis not present

## 2023-01-15 DIAGNOSIS — G309 Alzheimer's disease, unspecified: Secondary | ICD-10-CM | POA: Diagnosis not present

## 2023-01-16 DIAGNOSIS — I10 Essential (primary) hypertension: Secondary | ICD-10-CM | POA: Diagnosis not present

## 2023-01-16 DIAGNOSIS — G309 Alzheimer's disease, unspecified: Secondary | ICD-10-CM | POA: Diagnosis not present

## 2023-01-16 DIAGNOSIS — R634 Abnormal weight loss: Secondary | ICD-10-CM | POA: Diagnosis not present

## 2023-01-16 DIAGNOSIS — N1831 Chronic kidney disease, stage 3a: Secondary | ICD-10-CM | POA: Diagnosis not present

## 2023-01-16 DIAGNOSIS — E785 Hyperlipidemia, unspecified: Secondary | ICD-10-CM | POA: Diagnosis not present

## 2023-01-16 DIAGNOSIS — I482 Chronic atrial fibrillation, unspecified: Secondary | ICD-10-CM | POA: Diagnosis not present

## 2023-01-17 DIAGNOSIS — R634 Abnormal weight loss: Secondary | ICD-10-CM | POA: Diagnosis not present

## 2023-01-17 DIAGNOSIS — E785 Hyperlipidemia, unspecified: Secondary | ICD-10-CM | POA: Diagnosis not present

## 2023-01-17 DIAGNOSIS — G309 Alzheimer's disease, unspecified: Secondary | ICD-10-CM | POA: Diagnosis not present

## 2023-01-17 DIAGNOSIS — I10 Essential (primary) hypertension: Secondary | ICD-10-CM | POA: Diagnosis not present

## 2023-01-17 DIAGNOSIS — N1831 Chronic kidney disease, stage 3a: Secondary | ICD-10-CM | POA: Diagnosis not present

## 2023-01-17 DIAGNOSIS — I482 Chronic atrial fibrillation, unspecified: Secondary | ICD-10-CM | POA: Diagnosis not present

## 2023-01-18 DIAGNOSIS — I10 Essential (primary) hypertension: Secondary | ICD-10-CM | POA: Diagnosis not present

## 2023-01-18 DIAGNOSIS — E785 Hyperlipidemia, unspecified: Secondary | ICD-10-CM | POA: Diagnosis not present

## 2023-01-18 DIAGNOSIS — N1831 Chronic kidney disease, stage 3a: Secondary | ICD-10-CM | POA: Diagnosis not present

## 2023-01-18 DIAGNOSIS — R634 Abnormal weight loss: Secondary | ICD-10-CM | POA: Diagnosis not present

## 2023-01-18 DIAGNOSIS — I482 Chronic atrial fibrillation, unspecified: Secondary | ICD-10-CM | POA: Diagnosis not present

## 2023-01-18 DIAGNOSIS — G309 Alzheimer's disease, unspecified: Secondary | ICD-10-CM | POA: Diagnosis not present

## 2023-01-19 DIAGNOSIS — E785 Hyperlipidemia, unspecified: Secondary | ICD-10-CM | POA: Diagnosis not present

## 2023-01-19 DIAGNOSIS — R634 Abnormal weight loss: Secondary | ICD-10-CM | POA: Diagnosis not present

## 2023-01-19 DIAGNOSIS — G309 Alzheimer's disease, unspecified: Secondary | ICD-10-CM | POA: Diagnosis not present

## 2023-01-19 DIAGNOSIS — I482 Chronic atrial fibrillation, unspecified: Secondary | ICD-10-CM | POA: Diagnosis not present

## 2023-01-19 DIAGNOSIS — I10 Essential (primary) hypertension: Secondary | ICD-10-CM | POA: Diagnosis not present

## 2023-01-19 DIAGNOSIS — N1831 Chronic kidney disease, stage 3a: Secondary | ICD-10-CM | POA: Diagnosis not present

## 2023-01-20 DIAGNOSIS — E785 Hyperlipidemia, unspecified: Secondary | ICD-10-CM | POA: Diagnosis not present

## 2023-01-20 DIAGNOSIS — I10 Essential (primary) hypertension: Secondary | ICD-10-CM | POA: Diagnosis not present

## 2023-01-20 DIAGNOSIS — G309 Alzheimer's disease, unspecified: Secondary | ICD-10-CM | POA: Diagnosis not present

## 2023-01-20 DIAGNOSIS — R634 Abnormal weight loss: Secondary | ICD-10-CM | POA: Diagnosis not present

## 2023-01-20 DIAGNOSIS — N1831 Chronic kidney disease, stage 3a: Secondary | ICD-10-CM | POA: Diagnosis not present

## 2023-01-20 DIAGNOSIS — I482 Chronic atrial fibrillation, unspecified: Secondary | ICD-10-CM | POA: Diagnosis not present

## 2023-01-21 DIAGNOSIS — I10 Essential (primary) hypertension: Secondary | ICD-10-CM | POA: Diagnosis not present

## 2023-01-21 DIAGNOSIS — N1831 Chronic kidney disease, stage 3a: Secondary | ICD-10-CM | POA: Diagnosis not present

## 2023-01-21 DIAGNOSIS — E785 Hyperlipidemia, unspecified: Secondary | ICD-10-CM | POA: Diagnosis not present

## 2023-01-21 DIAGNOSIS — R634 Abnormal weight loss: Secondary | ICD-10-CM | POA: Diagnosis not present

## 2023-01-21 DIAGNOSIS — I482 Chronic atrial fibrillation, unspecified: Secondary | ICD-10-CM | POA: Diagnosis not present

## 2023-01-21 DIAGNOSIS — G309 Alzheimer's disease, unspecified: Secondary | ICD-10-CM | POA: Diagnosis not present

## 2023-01-22 DIAGNOSIS — E785 Hyperlipidemia, unspecified: Secondary | ICD-10-CM | POA: Diagnosis not present

## 2023-01-22 DIAGNOSIS — I10 Essential (primary) hypertension: Secondary | ICD-10-CM | POA: Diagnosis not present

## 2023-01-22 DIAGNOSIS — N1831 Chronic kidney disease, stage 3a: Secondary | ICD-10-CM | POA: Diagnosis not present

## 2023-01-22 DIAGNOSIS — G309 Alzheimer's disease, unspecified: Secondary | ICD-10-CM | POA: Diagnosis not present

## 2023-01-22 DIAGNOSIS — R634 Abnormal weight loss: Secondary | ICD-10-CM | POA: Diagnosis not present

## 2023-01-22 DIAGNOSIS — I482 Chronic atrial fibrillation, unspecified: Secondary | ICD-10-CM | POA: Diagnosis not present

## 2023-01-23 DIAGNOSIS — G309 Alzheimer's disease, unspecified: Secondary | ICD-10-CM | POA: Diagnosis not present

## 2023-01-23 DIAGNOSIS — I482 Chronic atrial fibrillation, unspecified: Secondary | ICD-10-CM | POA: Diagnosis not present

## 2023-01-23 DIAGNOSIS — R634 Abnormal weight loss: Secondary | ICD-10-CM | POA: Diagnosis not present

## 2023-01-23 DIAGNOSIS — N1831 Chronic kidney disease, stage 3a: Secondary | ICD-10-CM | POA: Diagnosis not present

## 2023-01-23 DIAGNOSIS — I10 Essential (primary) hypertension: Secondary | ICD-10-CM | POA: Diagnosis not present

## 2023-01-23 DIAGNOSIS — E785 Hyperlipidemia, unspecified: Secondary | ICD-10-CM | POA: Diagnosis not present

## 2023-01-24 DIAGNOSIS — N1831 Chronic kidney disease, stage 3a: Secondary | ICD-10-CM | POA: Diagnosis not present

## 2023-01-24 DIAGNOSIS — I482 Chronic atrial fibrillation, unspecified: Secondary | ICD-10-CM | POA: Diagnosis not present

## 2023-01-24 DIAGNOSIS — E785 Hyperlipidemia, unspecified: Secondary | ICD-10-CM | POA: Diagnosis not present

## 2023-01-24 DIAGNOSIS — G309 Alzheimer's disease, unspecified: Secondary | ICD-10-CM | POA: Diagnosis not present

## 2023-01-24 DIAGNOSIS — I10 Essential (primary) hypertension: Secondary | ICD-10-CM | POA: Diagnosis not present

## 2023-01-24 DIAGNOSIS — R634 Abnormal weight loss: Secondary | ICD-10-CM | POA: Diagnosis not present

## 2023-01-25 DIAGNOSIS — R634 Abnormal weight loss: Secondary | ICD-10-CM | POA: Diagnosis not present

## 2023-01-25 DIAGNOSIS — G309 Alzheimer's disease, unspecified: Secondary | ICD-10-CM | POA: Diagnosis not present

## 2023-01-25 DIAGNOSIS — N1831 Chronic kidney disease, stage 3a: Secondary | ICD-10-CM | POA: Diagnosis not present

## 2023-01-25 DIAGNOSIS — E785 Hyperlipidemia, unspecified: Secondary | ICD-10-CM | POA: Diagnosis not present

## 2023-01-25 DIAGNOSIS — I10 Essential (primary) hypertension: Secondary | ICD-10-CM | POA: Diagnosis not present

## 2023-01-25 DIAGNOSIS — I482 Chronic atrial fibrillation, unspecified: Secondary | ICD-10-CM | POA: Diagnosis not present

## 2023-01-26 DIAGNOSIS — I10 Essential (primary) hypertension: Secondary | ICD-10-CM | POA: Diagnosis not present

## 2023-01-26 DIAGNOSIS — N1831 Chronic kidney disease, stage 3a: Secondary | ICD-10-CM | POA: Diagnosis not present

## 2023-01-26 DIAGNOSIS — E785 Hyperlipidemia, unspecified: Secondary | ICD-10-CM | POA: Diagnosis not present

## 2023-01-26 DIAGNOSIS — I482 Chronic atrial fibrillation, unspecified: Secondary | ICD-10-CM | POA: Diagnosis not present

## 2023-01-26 DIAGNOSIS — G309 Alzheimer's disease, unspecified: Secondary | ICD-10-CM | POA: Diagnosis not present

## 2023-01-26 DIAGNOSIS — R634 Abnormal weight loss: Secondary | ICD-10-CM | POA: Diagnosis not present

## 2023-01-27 DIAGNOSIS — I482 Chronic atrial fibrillation, unspecified: Secondary | ICD-10-CM | POA: Diagnosis not present

## 2023-01-27 DIAGNOSIS — E785 Hyperlipidemia, unspecified: Secondary | ICD-10-CM | POA: Diagnosis not present

## 2023-01-27 DIAGNOSIS — R634 Abnormal weight loss: Secondary | ICD-10-CM | POA: Diagnosis not present

## 2023-01-27 DIAGNOSIS — G309 Alzheimer's disease, unspecified: Secondary | ICD-10-CM | POA: Diagnosis not present

## 2023-01-27 DIAGNOSIS — I10 Essential (primary) hypertension: Secondary | ICD-10-CM | POA: Diagnosis not present

## 2023-01-27 DIAGNOSIS — N1831 Chronic kidney disease, stage 3a: Secondary | ICD-10-CM | POA: Diagnosis not present

## 2023-01-28 DIAGNOSIS — G309 Alzheimer's disease, unspecified: Secondary | ICD-10-CM | POA: Diagnosis not present

## 2023-01-28 DIAGNOSIS — I10 Essential (primary) hypertension: Secondary | ICD-10-CM | POA: Diagnosis not present

## 2023-01-28 DIAGNOSIS — I482 Chronic atrial fibrillation, unspecified: Secondary | ICD-10-CM | POA: Diagnosis not present

## 2023-01-28 DIAGNOSIS — R634 Abnormal weight loss: Secondary | ICD-10-CM | POA: Diagnosis not present

## 2023-01-28 DIAGNOSIS — E785 Hyperlipidemia, unspecified: Secondary | ICD-10-CM | POA: Diagnosis not present

## 2023-01-28 DIAGNOSIS — N1831 Chronic kidney disease, stage 3a: Secondary | ICD-10-CM | POA: Diagnosis not present

## 2023-01-29 DIAGNOSIS — I10 Essential (primary) hypertension: Secondary | ICD-10-CM | POA: Diagnosis not present

## 2023-01-29 DIAGNOSIS — R634 Abnormal weight loss: Secondary | ICD-10-CM | POA: Diagnosis not present

## 2023-01-29 DIAGNOSIS — I482 Chronic atrial fibrillation, unspecified: Secondary | ICD-10-CM | POA: Diagnosis not present

## 2023-01-29 DIAGNOSIS — E785 Hyperlipidemia, unspecified: Secondary | ICD-10-CM | POA: Diagnosis not present

## 2023-01-29 DIAGNOSIS — G309 Alzheimer's disease, unspecified: Secondary | ICD-10-CM | POA: Diagnosis not present

## 2023-01-29 DIAGNOSIS — N1831 Chronic kidney disease, stage 3a: Secondary | ICD-10-CM | POA: Diagnosis not present

## 2023-01-30 DIAGNOSIS — I482 Chronic atrial fibrillation, unspecified: Secondary | ICD-10-CM | POA: Diagnosis not present

## 2023-01-30 DIAGNOSIS — E785 Hyperlipidemia, unspecified: Secondary | ICD-10-CM | POA: Diagnosis not present

## 2023-01-30 DIAGNOSIS — N1831 Chronic kidney disease, stage 3a: Secondary | ICD-10-CM | POA: Diagnosis not present

## 2023-01-30 DIAGNOSIS — I10 Essential (primary) hypertension: Secondary | ICD-10-CM | POA: Diagnosis not present

## 2023-01-30 DIAGNOSIS — R634 Abnormal weight loss: Secondary | ICD-10-CM | POA: Diagnosis not present

## 2023-01-30 DIAGNOSIS — G309 Alzheimer's disease, unspecified: Secondary | ICD-10-CM | POA: Diagnosis not present

## 2023-01-31 DIAGNOSIS — E785 Hyperlipidemia, unspecified: Secondary | ICD-10-CM | POA: Diagnosis not present

## 2023-01-31 DIAGNOSIS — I10 Essential (primary) hypertension: Secondary | ICD-10-CM | POA: Diagnosis not present

## 2023-01-31 DIAGNOSIS — I482 Chronic atrial fibrillation, unspecified: Secondary | ICD-10-CM | POA: Diagnosis not present

## 2023-01-31 DIAGNOSIS — G309 Alzheimer's disease, unspecified: Secondary | ICD-10-CM | POA: Diagnosis not present

## 2023-01-31 DIAGNOSIS — N1831 Chronic kidney disease, stage 3a: Secondary | ICD-10-CM | POA: Diagnosis not present

## 2023-01-31 DIAGNOSIS — R634 Abnormal weight loss: Secondary | ICD-10-CM | POA: Diagnosis not present

## 2023-02-01 DIAGNOSIS — G309 Alzheimer's disease, unspecified: Secondary | ICD-10-CM | POA: Diagnosis not present

## 2023-02-01 DIAGNOSIS — R634 Abnormal weight loss: Secondary | ICD-10-CM | POA: Diagnosis not present

## 2023-02-01 DIAGNOSIS — N1831 Chronic kidney disease, stage 3a: Secondary | ICD-10-CM | POA: Diagnosis not present

## 2023-02-01 DIAGNOSIS — E785 Hyperlipidemia, unspecified: Secondary | ICD-10-CM | POA: Diagnosis not present

## 2023-02-01 DIAGNOSIS — I10 Essential (primary) hypertension: Secondary | ICD-10-CM | POA: Diagnosis not present

## 2023-02-01 DIAGNOSIS — I482 Chronic atrial fibrillation, unspecified: Secondary | ICD-10-CM | POA: Diagnosis not present

## 2023-02-02 DIAGNOSIS — G309 Alzheimer's disease, unspecified: Secondary | ICD-10-CM | POA: Diagnosis not present

## 2023-02-02 DIAGNOSIS — N1831 Chronic kidney disease, stage 3a: Secondary | ICD-10-CM | POA: Diagnosis not present

## 2023-02-02 DIAGNOSIS — R634 Abnormal weight loss: Secondary | ICD-10-CM | POA: Diagnosis not present

## 2023-02-02 DIAGNOSIS — I10 Essential (primary) hypertension: Secondary | ICD-10-CM | POA: Diagnosis not present

## 2023-02-02 DIAGNOSIS — F32A Depression, unspecified: Secondary | ICD-10-CM | POA: Diagnosis not present

## 2023-02-02 DIAGNOSIS — I482 Chronic atrial fibrillation, unspecified: Secondary | ICD-10-CM | POA: Diagnosis not present

## 2023-02-02 DIAGNOSIS — E785 Hyperlipidemia, unspecified: Secondary | ICD-10-CM | POA: Diagnosis not present

## 2023-02-05 DIAGNOSIS — I482 Chronic atrial fibrillation, unspecified: Secondary | ICD-10-CM | POA: Diagnosis not present

## 2023-02-05 DIAGNOSIS — N1831 Chronic kidney disease, stage 3a: Secondary | ICD-10-CM | POA: Diagnosis not present

## 2023-02-05 DIAGNOSIS — G309 Alzheimer's disease, unspecified: Secondary | ICD-10-CM | POA: Diagnosis not present

## 2023-02-05 DIAGNOSIS — R634 Abnormal weight loss: Secondary | ICD-10-CM | POA: Diagnosis not present

## 2023-02-05 DIAGNOSIS — I10 Essential (primary) hypertension: Secondary | ICD-10-CM | POA: Diagnosis not present

## 2023-02-05 DIAGNOSIS — E785 Hyperlipidemia, unspecified: Secondary | ICD-10-CM | POA: Diagnosis not present

## 2023-02-14 DIAGNOSIS — N1831 Chronic kidney disease, stage 3a: Secondary | ICD-10-CM | POA: Diagnosis not present

## 2023-02-14 DIAGNOSIS — I10 Essential (primary) hypertension: Secondary | ICD-10-CM | POA: Diagnosis not present

## 2023-02-14 DIAGNOSIS — I482 Chronic atrial fibrillation, unspecified: Secondary | ICD-10-CM | POA: Diagnosis not present

## 2023-02-14 DIAGNOSIS — G309 Alzheimer's disease, unspecified: Secondary | ICD-10-CM | POA: Diagnosis not present

## 2023-02-14 DIAGNOSIS — R634 Abnormal weight loss: Secondary | ICD-10-CM | POA: Diagnosis not present

## 2023-02-14 DIAGNOSIS — E785 Hyperlipidemia, unspecified: Secondary | ICD-10-CM | POA: Diagnosis not present

## 2023-02-20 DIAGNOSIS — E785 Hyperlipidemia, unspecified: Secondary | ICD-10-CM | POA: Diagnosis not present

## 2023-02-20 DIAGNOSIS — G309 Alzheimer's disease, unspecified: Secondary | ICD-10-CM | POA: Diagnosis not present

## 2023-02-20 DIAGNOSIS — I482 Chronic atrial fibrillation, unspecified: Secondary | ICD-10-CM | POA: Diagnosis not present

## 2023-02-20 DIAGNOSIS — I10 Essential (primary) hypertension: Secondary | ICD-10-CM | POA: Diagnosis not present

## 2023-02-20 DIAGNOSIS — R634 Abnormal weight loss: Secondary | ICD-10-CM | POA: Diagnosis not present

## 2023-02-20 DIAGNOSIS — N1831 Chronic kidney disease, stage 3a: Secondary | ICD-10-CM | POA: Diagnosis not present

## 2023-02-27 DIAGNOSIS — R634 Abnormal weight loss: Secondary | ICD-10-CM | POA: Diagnosis not present

## 2023-02-27 DIAGNOSIS — G309 Alzheimer's disease, unspecified: Secondary | ICD-10-CM | POA: Diagnosis not present

## 2023-02-27 DIAGNOSIS — E785 Hyperlipidemia, unspecified: Secondary | ICD-10-CM | POA: Diagnosis not present

## 2023-02-27 DIAGNOSIS — I482 Chronic atrial fibrillation, unspecified: Secondary | ICD-10-CM | POA: Diagnosis not present

## 2023-02-27 DIAGNOSIS — N1831 Chronic kidney disease, stage 3a: Secondary | ICD-10-CM | POA: Diagnosis not present

## 2023-02-27 DIAGNOSIS — I10 Essential (primary) hypertension: Secondary | ICD-10-CM | POA: Diagnosis not present

## 2023-03-04 DIAGNOSIS — I482 Chronic atrial fibrillation, unspecified: Secondary | ICD-10-CM | POA: Diagnosis not present

## 2023-03-04 DIAGNOSIS — E785 Hyperlipidemia, unspecified: Secondary | ICD-10-CM | POA: Diagnosis not present

## 2023-03-04 DIAGNOSIS — I10 Essential (primary) hypertension: Secondary | ICD-10-CM | POA: Diagnosis not present

## 2023-03-04 DIAGNOSIS — G309 Alzheimer's disease, unspecified: Secondary | ICD-10-CM | POA: Diagnosis not present

## 2023-03-04 DIAGNOSIS — F32A Depression, unspecified: Secondary | ICD-10-CM | POA: Diagnosis not present

## 2023-03-04 DIAGNOSIS — N1831 Chronic kidney disease, stage 3a: Secondary | ICD-10-CM | POA: Diagnosis not present

## 2023-03-04 DIAGNOSIS — R634 Abnormal weight loss: Secondary | ICD-10-CM | POA: Diagnosis not present

## 2023-03-06 DIAGNOSIS — I10 Essential (primary) hypertension: Secondary | ICD-10-CM | POA: Diagnosis not present

## 2023-03-06 DIAGNOSIS — R634 Abnormal weight loss: Secondary | ICD-10-CM | POA: Diagnosis not present

## 2023-03-06 DIAGNOSIS — G309 Alzheimer's disease, unspecified: Secondary | ICD-10-CM | POA: Diagnosis not present

## 2023-03-06 DIAGNOSIS — N1831 Chronic kidney disease, stage 3a: Secondary | ICD-10-CM | POA: Diagnosis not present

## 2023-03-06 DIAGNOSIS — I482 Chronic atrial fibrillation, unspecified: Secondary | ICD-10-CM | POA: Diagnosis not present

## 2023-03-06 DIAGNOSIS — E785 Hyperlipidemia, unspecified: Secondary | ICD-10-CM | POA: Diagnosis not present

## 2023-03-13 DIAGNOSIS — I10 Essential (primary) hypertension: Secondary | ICD-10-CM | POA: Diagnosis not present

## 2023-03-13 DIAGNOSIS — I482 Chronic atrial fibrillation, unspecified: Secondary | ICD-10-CM | POA: Diagnosis not present

## 2023-03-13 DIAGNOSIS — N1831 Chronic kidney disease, stage 3a: Secondary | ICD-10-CM | POA: Diagnosis not present

## 2023-03-13 DIAGNOSIS — R634 Abnormal weight loss: Secondary | ICD-10-CM | POA: Diagnosis not present

## 2023-03-13 DIAGNOSIS — E785 Hyperlipidemia, unspecified: Secondary | ICD-10-CM | POA: Diagnosis not present

## 2023-03-13 DIAGNOSIS — G309 Alzheimer's disease, unspecified: Secondary | ICD-10-CM | POA: Diagnosis not present

## 2023-03-15 DIAGNOSIS — I10 Essential (primary) hypertension: Secondary | ICD-10-CM | POA: Diagnosis not present

## 2023-03-15 DIAGNOSIS — N1831 Chronic kidney disease, stage 3a: Secondary | ICD-10-CM | POA: Diagnosis not present

## 2023-03-15 DIAGNOSIS — I482 Chronic atrial fibrillation, unspecified: Secondary | ICD-10-CM | POA: Diagnosis not present

## 2023-03-15 DIAGNOSIS — E785 Hyperlipidemia, unspecified: Secondary | ICD-10-CM | POA: Diagnosis not present

## 2023-03-15 DIAGNOSIS — G309 Alzheimer's disease, unspecified: Secondary | ICD-10-CM | POA: Diagnosis not present

## 2023-03-15 DIAGNOSIS — R634 Abnormal weight loss: Secondary | ICD-10-CM | POA: Diagnosis not present

## 2023-03-20 DIAGNOSIS — E785 Hyperlipidemia, unspecified: Secondary | ICD-10-CM | POA: Diagnosis not present

## 2023-03-20 DIAGNOSIS — N1831 Chronic kidney disease, stage 3a: Secondary | ICD-10-CM | POA: Diagnosis not present

## 2023-03-20 DIAGNOSIS — I482 Chronic atrial fibrillation, unspecified: Secondary | ICD-10-CM | POA: Diagnosis not present

## 2023-03-20 DIAGNOSIS — G309 Alzheimer's disease, unspecified: Secondary | ICD-10-CM | POA: Diagnosis not present

## 2023-03-20 DIAGNOSIS — R634 Abnormal weight loss: Secondary | ICD-10-CM | POA: Diagnosis not present

## 2023-03-20 DIAGNOSIS — I10 Essential (primary) hypertension: Secondary | ICD-10-CM | POA: Diagnosis not present

## 2023-03-29 DIAGNOSIS — N1831 Chronic kidney disease, stage 3a: Secondary | ICD-10-CM | POA: Diagnosis not present

## 2023-03-29 DIAGNOSIS — R634 Abnormal weight loss: Secondary | ICD-10-CM | POA: Diagnosis not present

## 2023-03-29 DIAGNOSIS — I10 Essential (primary) hypertension: Secondary | ICD-10-CM | POA: Diagnosis not present

## 2023-03-29 DIAGNOSIS — I482 Chronic atrial fibrillation, unspecified: Secondary | ICD-10-CM | POA: Diagnosis not present

## 2023-03-29 DIAGNOSIS — E785 Hyperlipidemia, unspecified: Secondary | ICD-10-CM | POA: Diagnosis not present

## 2023-03-29 DIAGNOSIS — G309 Alzheimer's disease, unspecified: Secondary | ICD-10-CM | POA: Diagnosis not present

## 2023-04-02 DIAGNOSIS — I10 Essential (primary) hypertension: Secondary | ICD-10-CM | POA: Diagnosis not present

## 2023-04-02 DIAGNOSIS — E785 Hyperlipidemia, unspecified: Secondary | ICD-10-CM | POA: Diagnosis not present

## 2023-04-02 DIAGNOSIS — R634 Abnormal weight loss: Secondary | ICD-10-CM | POA: Diagnosis not present

## 2023-04-02 DIAGNOSIS — N1831 Chronic kidney disease, stage 3a: Secondary | ICD-10-CM | POA: Diagnosis not present

## 2023-04-02 DIAGNOSIS — G309 Alzheimer's disease, unspecified: Secondary | ICD-10-CM | POA: Diagnosis not present

## 2023-04-02 DIAGNOSIS — I482 Chronic atrial fibrillation, unspecified: Secondary | ICD-10-CM | POA: Diagnosis not present

## 2023-04-03 DIAGNOSIS — E785 Hyperlipidemia, unspecified: Secondary | ICD-10-CM | POA: Diagnosis not present

## 2023-04-03 DIAGNOSIS — G309 Alzheimer's disease, unspecified: Secondary | ICD-10-CM | POA: Diagnosis not present

## 2023-04-03 DIAGNOSIS — I482 Chronic atrial fibrillation, unspecified: Secondary | ICD-10-CM | POA: Diagnosis not present

## 2023-04-03 DIAGNOSIS — N1831 Chronic kidney disease, stage 3a: Secondary | ICD-10-CM | POA: Diagnosis not present

## 2023-04-03 DIAGNOSIS — R634 Abnormal weight loss: Secondary | ICD-10-CM | POA: Diagnosis not present

## 2023-04-03 DIAGNOSIS — I10 Essential (primary) hypertension: Secondary | ICD-10-CM | POA: Diagnosis not present

## 2023-04-04 DIAGNOSIS — N1831 Chronic kidney disease, stage 3a: Secondary | ICD-10-CM | POA: Diagnosis not present

## 2023-04-04 DIAGNOSIS — I10 Essential (primary) hypertension: Secondary | ICD-10-CM | POA: Diagnosis not present

## 2023-04-04 DIAGNOSIS — F32A Depression, unspecified: Secondary | ICD-10-CM | POA: Diagnosis not present

## 2023-04-04 DIAGNOSIS — I482 Chronic atrial fibrillation, unspecified: Secondary | ICD-10-CM | POA: Diagnosis not present

## 2023-04-04 DIAGNOSIS — G309 Alzheimer's disease, unspecified: Secondary | ICD-10-CM | POA: Diagnosis not present

## 2023-04-04 DIAGNOSIS — E785 Hyperlipidemia, unspecified: Secondary | ICD-10-CM | POA: Diagnosis not present

## 2023-04-04 DIAGNOSIS — R634 Abnormal weight loss: Secondary | ICD-10-CM | POA: Diagnosis not present

## 2023-04-09 DIAGNOSIS — N1831 Chronic kidney disease, stage 3a: Secondary | ICD-10-CM | POA: Diagnosis not present

## 2023-04-09 DIAGNOSIS — E785 Hyperlipidemia, unspecified: Secondary | ICD-10-CM | POA: Diagnosis not present

## 2023-04-09 DIAGNOSIS — R634 Abnormal weight loss: Secondary | ICD-10-CM | POA: Diagnosis not present

## 2023-04-09 DIAGNOSIS — I10 Essential (primary) hypertension: Secondary | ICD-10-CM | POA: Diagnosis not present

## 2023-04-09 DIAGNOSIS — G309 Alzheimer's disease, unspecified: Secondary | ICD-10-CM | POA: Diagnosis not present

## 2023-04-09 DIAGNOSIS — I482 Chronic atrial fibrillation, unspecified: Secondary | ICD-10-CM | POA: Diagnosis not present

## 2023-04-12 DIAGNOSIS — R634 Abnormal weight loss: Secondary | ICD-10-CM | POA: Diagnosis not present

## 2023-04-12 DIAGNOSIS — G309 Alzheimer's disease, unspecified: Secondary | ICD-10-CM | POA: Diagnosis not present

## 2023-04-12 DIAGNOSIS — N1831 Chronic kidney disease, stage 3a: Secondary | ICD-10-CM | POA: Diagnosis not present

## 2023-04-12 DIAGNOSIS — I10 Essential (primary) hypertension: Secondary | ICD-10-CM | POA: Diagnosis not present

## 2023-04-12 DIAGNOSIS — I482 Chronic atrial fibrillation, unspecified: Secondary | ICD-10-CM | POA: Diagnosis not present

## 2023-04-12 DIAGNOSIS — E785 Hyperlipidemia, unspecified: Secondary | ICD-10-CM | POA: Diagnosis not present

## 2023-04-17 DIAGNOSIS — I10 Essential (primary) hypertension: Secondary | ICD-10-CM | POA: Diagnosis not present

## 2023-04-17 DIAGNOSIS — E785 Hyperlipidemia, unspecified: Secondary | ICD-10-CM | POA: Diagnosis not present

## 2023-04-17 DIAGNOSIS — R634 Abnormal weight loss: Secondary | ICD-10-CM | POA: Diagnosis not present

## 2023-04-17 DIAGNOSIS — N1831 Chronic kidney disease, stage 3a: Secondary | ICD-10-CM | POA: Diagnosis not present

## 2023-04-17 DIAGNOSIS — G309 Alzheimer's disease, unspecified: Secondary | ICD-10-CM | POA: Diagnosis not present

## 2023-04-17 DIAGNOSIS — I482 Chronic atrial fibrillation, unspecified: Secondary | ICD-10-CM | POA: Diagnosis not present

## 2023-04-24 DIAGNOSIS — N1831 Chronic kidney disease, stage 3a: Secondary | ICD-10-CM | POA: Diagnosis not present

## 2023-04-24 DIAGNOSIS — G309 Alzheimer's disease, unspecified: Secondary | ICD-10-CM | POA: Diagnosis not present

## 2023-04-24 DIAGNOSIS — I482 Chronic atrial fibrillation, unspecified: Secondary | ICD-10-CM | POA: Diagnosis not present

## 2023-04-24 DIAGNOSIS — E785 Hyperlipidemia, unspecified: Secondary | ICD-10-CM | POA: Diagnosis not present

## 2023-04-24 DIAGNOSIS — I10 Essential (primary) hypertension: Secondary | ICD-10-CM | POA: Diagnosis not present

## 2023-04-24 DIAGNOSIS — R634 Abnormal weight loss: Secondary | ICD-10-CM | POA: Diagnosis not present

## 2023-05-01 DIAGNOSIS — N1831 Chronic kidney disease, stage 3a: Secondary | ICD-10-CM | POA: Diagnosis not present

## 2023-05-01 DIAGNOSIS — G309 Alzheimer's disease, unspecified: Secondary | ICD-10-CM | POA: Diagnosis not present

## 2023-05-01 DIAGNOSIS — I482 Chronic atrial fibrillation, unspecified: Secondary | ICD-10-CM | POA: Diagnosis not present

## 2023-05-01 DIAGNOSIS — I10 Essential (primary) hypertension: Secondary | ICD-10-CM | POA: Diagnosis not present

## 2023-05-01 DIAGNOSIS — R634 Abnormal weight loss: Secondary | ICD-10-CM | POA: Diagnosis not present

## 2023-05-01 DIAGNOSIS — E785 Hyperlipidemia, unspecified: Secondary | ICD-10-CM | POA: Diagnosis not present

## 2023-05-04 DIAGNOSIS — E785 Hyperlipidemia, unspecified: Secondary | ICD-10-CM | POA: Diagnosis not present

## 2023-05-04 DIAGNOSIS — N1831 Chronic kidney disease, stage 3a: Secondary | ICD-10-CM | POA: Diagnosis not present

## 2023-05-04 DIAGNOSIS — I482 Chronic atrial fibrillation, unspecified: Secondary | ICD-10-CM | POA: Diagnosis not present

## 2023-05-04 DIAGNOSIS — I10 Essential (primary) hypertension: Secondary | ICD-10-CM | POA: Diagnosis not present

## 2023-05-04 DIAGNOSIS — G309 Alzheimer's disease, unspecified: Secondary | ICD-10-CM | POA: Diagnosis not present

## 2023-05-04 DIAGNOSIS — R634 Abnormal weight loss: Secondary | ICD-10-CM | POA: Diagnosis not present

## 2023-05-05 DIAGNOSIS — N1831 Chronic kidney disease, stage 3a: Secondary | ICD-10-CM | POA: Diagnosis not present

## 2023-05-05 DIAGNOSIS — F32A Depression, unspecified: Secondary | ICD-10-CM | POA: Diagnosis not present

## 2023-05-05 DIAGNOSIS — R634 Abnormal weight loss: Secondary | ICD-10-CM | POA: Diagnosis not present

## 2023-05-05 DIAGNOSIS — E785 Hyperlipidemia, unspecified: Secondary | ICD-10-CM | POA: Diagnosis not present

## 2023-05-05 DIAGNOSIS — I482 Chronic atrial fibrillation, unspecified: Secondary | ICD-10-CM | POA: Diagnosis not present

## 2023-05-05 DIAGNOSIS — G309 Alzheimer's disease, unspecified: Secondary | ICD-10-CM | POA: Diagnosis not present

## 2023-05-05 DIAGNOSIS — I10 Essential (primary) hypertension: Secondary | ICD-10-CM | POA: Diagnosis not present

## 2023-05-08 DIAGNOSIS — N1831 Chronic kidney disease, stage 3a: Secondary | ICD-10-CM | POA: Diagnosis not present

## 2023-05-08 DIAGNOSIS — I10 Essential (primary) hypertension: Secondary | ICD-10-CM | POA: Diagnosis not present

## 2023-05-08 DIAGNOSIS — I482 Chronic atrial fibrillation, unspecified: Secondary | ICD-10-CM | POA: Diagnosis not present

## 2023-05-08 DIAGNOSIS — R634 Abnormal weight loss: Secondary | ICD-10-CM | POA: Diagnosis not present

## 2023-05-08 DIAGNOSIS — E785 Hyperlipidemia, unspecified: Secondary | ICD-10-CM | POA: Diagnosis not present

## 2023-05-08 DIAGNOSIS — G309 Alzheimer's disease, unspecified: Secondary | ICD-10-CM | POA: Diagnosis not present

## 2023-05-09 DIAGNOSIS — N1831 Chronic kidney disease, stage 3a: Secondary | ICD-10-CM | POA: Diagnosis not present

## 2023-05-09 DIAGNOSIS — R634 Abnormal weight loss: Secondary | ICD-10-CM | POA: Diagnosis not present

## 2023-05-09 DIAGNOSIS — I10 Essential (primary) hypertension: Secondary | ICD-10-CM | POA: Diagnosis not present

## 2023-05-09 DIAGNOSIS — G309 Alzheimer's disease, unspecified: Secondary | ICD-10-CM | POA: Diagnosis not present

## 2023-05-09 DIAGNOSIS — I482 Chronic atrial fibrillation, unspecified: Secondary | ICD-10-CM | POA: Diagnosis not present

## 2023-05-09 DIAGNOSIS — E785 Hyperlipidemia, unspecified: Secondary | ICD-10-CM | POA: Diagnosis not present

## 2023-05-16 DIAGNOSIS — G309 Alzheimer's disease, unspecified: Secondary | ICD-10-CM | POA: Diagnosis not present

## 2023-05-16 DIAGNOSIS — I482 Chronic atrial fibrillation, unspecified: Secondary | ICD-10-CM | POA: Diagnosis not present

## 2023-05-16 DIAGNOSIS — E785 Hyperlipidemia, unspecified: Secondary | ICD-10-CM | POA: Diagnosis not present

## 2023-05-16 DIAGNOSIS — I10 Essential (primary) hypertension: Secondary | ICD-10-CM | POA: Diagnosis not present

## 2023-05-16 DIAGNOSIS — R634 Abnormal weight loss: Secondary | ICD-10-CM | POA: Diagnosis not present

## 2023-05-16 DIAGNOSIS — N1831 Chronic kidney disease, stage 3a: Secondary | ICD-10-CM | POA: Diagnosis not present

## 2023-05-22 DIAGNOSIS — N1831 Chronic kidney disease, stage 3a: Secondary | ICD-10-CM | POA: Diagnosis not present

## 2023-05-22 DIAGNOSIS — R634 Abnormal weight loss: Secondary | ICD-10-CM | POA: Diagnosis not present

## 2023-05-22 DIAGNOSIS — I482 Chronic atrial fibrillation, unspecified: Secondary | ICD-10-CM | POA: Diagnosis not present

## 2023-05-22 DIAGNOSIS — G309 Alzheimer's disease, unspecified: Secondary | ICD-10-CM | POA: Diagnosis not present

## 2023-05-22 DIAGNOSIS — I10 Essential (primary) hypertension: Secondary | ICD-10-CM | POA: Diagnosis not present

## 2023-05-22 DIAGNOSIS — E785 Hyperlipidemia, unspecified: Secondary | ICD-10-CM | POA: Diagnosis not present

## 2023-05-23 DIAGNOSIS — R634 Abnormal weight loss: Secondary | ICD-10-CM | POA: Diagnosis not present

## 2023-05-23 DIAGNOSIS — E785 Hyperlipidemia, unspecified: Secondary | ICD-10-CM | POA: Diagnosis not present

## 2023-05-23 DIAGNOSIS — I482 Chronic atrial fibrillation, unspecified: Secondary | ICD-10-CM | POA: Diagnosis not present

## 2023-05-23 DIAGNOSIS — N1831 Chronic kidney disease, stage 3a: Secondary | ICD-10-CM | POA: Diagnosis not present

## 2023-05-23 DIAGNOSIS — I10 Essential (primary) hypertension: Secondary | ICD-10-CM | POA: Diagnosis not present

## 2023-05-23 DIAGNOSIS — G309 Alzheimer's disease, unspecified: Secondary | ICD-10-CM | POA: Diagnosis not present

## 2023-05-30 DIAGNOSIS — E785 Hyperlipidemia, unspecified: Secondary | ICD-10-CM | POA: Diagnosis not present

## 2023-05-30 DIAGNOSIS — R634 Abnormal weight loss: Secondary | ICD-10-CM | POA: Diagnosis not present

## 2023-05-30 DIAGNOSIS — N1831 Chronic kidney disease, stage 3a: Secondary | ICD-10-CM | POA: Diagnosis not present

## 2023-05-30 DIAGNOSIS — G309 Alzheimer's disease, unspecified: Secondary | ICD-10-CM | POA: Diagnosis not present

## 2023-05-30 DIAGNOSIS — I10 Essential (primary) hypertension: Secondary | ICD-10-CM | POA: Diagnosis not present

## 2023-05-30 DIAGNOSIS — I482 Chronic atrial fibrillation, unspecified: Secondary | ICD-10-CM | POA: Diagnosis not present

## 2023-06-02 DIAGNOSIS — N1831 Chronic kidney disease, stage 3a: Secondary | ICD-10-CM | POA: Diagnosis not present

## 2023-06-02 DIAGNOSIS — R634 Abnormal weight loss: Secondary | ICD-10-CM | POA: Diagnosis not present

## 2023-06-02 DIAGNOSIS — E785 Hyperlipidemia, unspecified: Secondary | ICD-10-CM | POA: Diagnosis not present

## 2023-06-02 DIAGNOSIS — G309 Alzheimer's disease, unspecified: Secondary | ICD-10-CM | POA: Diagnosis not present

## 2023-06-02 DIAGNOSIS — F32A Depression, unspecified: Secondary | ICD-10-CM | POA: Diagnosis not present

## 2023-06-02 DIAGNOSIS — I482 Chronic atrial fibrillation, unspecified: Secondary | ICD-10-CM | POA: Diagnosis not present

## 2023-06-02 DIAGNOSIS — I10 Essential (primary) hypertension: Secondary | ICD-10-CM | POA: Diagnosis not present

## 2023-06-05 DIAGNOSIS — G309 Alzheimer's disease, unspecified: Secondary | ICD-10-CM | POA: Diagnosis not present

## 2023-06-05 DIAGNOSIS — R634 Abnormal weight loss: Secondary | ICD-10-CM | POA: Diagnosis not present

## 2023-06-05 DIAGNOSIS — I482 Chronic atrial fibrillation, unspecified: Secondary | ICD-10-CM | POA: Diagnosis not present

## 2023-06-05 DIAGNOSIS — I10 Essential (primary) hypertension: Secondary | ICD-10-CM | POA: Diagnosis not present

## 2023-06-05 DIAGNOSIS — N1831 Chronic kidney disease, stage 3a: Secondary | ICD-10-CM | POA: Diagnosis not present

## 2023-06-05 DIAGNOSIS — E785 Hyperlipidemia, unspecified: Secondary | ICD-10-CM | POA: Diagnosis not present

## 2023-06-07 DIAGNOSIS — E785 Hyperlipidemia, unspecified: Secondary | ICD-10-CM | POA: Diagnosis not present

## 2023-06-07 DIAGNOSIS — N1831 Chronic kidney disease, stage 3a: Secondary | ICD-10-CM | POA: Diagnosis not present

## 2023-06-07 DIAGNOSIS — G309 Alzheimer's disease, unspecified: Secondary | ICD-10-CM | POA: Diagnosis not present

## 2023-06-07 DIAGNOSIS — I10 Essential (primary) hypertension: Secondary | ICD-10-CM | POA: Diagnosis not present

## 2023-06-07 DIAGNOSIS — R634 Abnormal weight loss: Secondary | ICD-10-CM | POA: Diagnosis not present

## 2023-06-07 DIAGNOSIS — I482 Chronic atrial fibrillation, unspecified: Secondary | ICD-10-CM | POA: Diagnosis not present

## 2023-06-12 DIAGNOSIS — N1831 Chronic kidney disease, stage 3a: Secondary | ICD-10-CM | POA: Diagnosis not present

## 2023-06-12 DIAGNOSIS — I10 Essential (primary) hypertension: Secondary | ICD-10-CM | POA: Diagnosis not present

## 2023-06-12 DIAGNOSIS — E785 Hyperlipidemia, unspecified: Secondary | ICD-10-CM | POA: Diagnosis not present

## 2023-06-12 DIAGNOSIS — G309 Alzheimer's disease, unspecified: Secondary | ICD-10-CM | POA: Diagnosis not present

## 2023-06-12 DIAGNOSIS — R634 Abnormal weight loss: Secondary | ICD-10-CM | POA: Diagnosis not present

## 2023-06-12 DIAGNOSIS — I482 Chronic atrial fibrillation, unspecified: Secondary | ICD-10-CM | POA: Diagnosis not present

## 2023-06-20 DIAGNOSIS — I482 Chronic atrial fibrillation, unspecified: Secondary | ICD-10-CM | POA: Diagnosis not present

## 2023-06-20 DIAGNOSIS — R634 Abnormal weight loss: Secondary | ICD-10-CM | POA: Diagnosis not present

## 2023-06-20 DIAGNOSIS — E785 Hyperlipidemia, unspecified: Secondary | ICD-10-CM | POA: Diagnosis not present

## 2023-06-20 DIAGNOSIS — G309 Alzheimer's disease, unspecified: Secondary | ICD-10-CM | POA: Diagnosis not present

## 2023-06-20 DIAGNOSIS — N1831 Chronic kidney disease, stage 3a: Secondary | ICD-10-CM | POA: Diagnosis not present

## 2023-06-20 DIAGNOSIS — I10 Essential (primary) hypertension: Secondary | ICD-10-CM | POA: Diagnosis not present

## 2023-06-22 DIAGNOSIS — R634 Abnormal weight loss: Secondary | ICD-10-CM | POA: Diagnosis not present

## 2023-06-22 DIAGNOSIS — G309 Alzheimer's disease, unspecified: Secondary | ICD-10-CM | POA: Diagnosis not present

## 2023-06-22 DIAGNOSIS — N1831 Chronic kidney disease, stage 3a: Secondary | ICD-10-CM | POA: Diagnosis not present

## 2023-06-22 DIAGNOSIS — I10 Essential (primary) hypertension: Secondary | ICD-10-CM | POA: Diagnosis not present

## 2023-06-22 DIAGNOSIS — I482 Chronic atrial fibrillation, unspecified: Secondary | ICD-10-CM | POA: Diagnosis not present

## 2023-06-22 DIAGNOSIS — E785 Hyperlipidemia, unspecified: Secondary | ICD-10-CM | POA: Diagnosis not present

## 2023-06-27 DIAGNOSIS — R634 Abnormal weight loss: Secondary | ICD-10-CM | POA: Diagnosis not present

## 2023-06-27 DIAGNOSIS — G309 Alzheimer's disease, unspecified: Secondary | ICD-10-CM | POA: Diagnosis not present

## 2023-06-27 DIAGNOSIS — E785 Hyperlipidemia, unspecified: Secondary | ICD-10-CM | POA: Diagnosis not present

## 2023-06-27 DIAGNOSIS — I482 Chronic atrial fibrillation, unspecified: Secondary | ICD-10-CM | POA: Diagnosis not present

## 2023-06-27 DIAGNOSIS — I10 Essential (primary) hypertension: Secondary | ICD-10-CM | POA: Diagnosis not present

## 2023-06-27 DIAGNOSIS — N1831 Chronic kidney disease, stage 3a: Secondary | ICD-10-CM | POA: Diagnosis not present

## 2023-07-03 DIAGNOSIS — I10 Essential (primary) hypertension: Secondary | ICD-10-CM | POA: Diagnosis not present

## 2023-07-03 DIAGNOSIS — E785 Hyperlipidemia, unspecified: Secondary | ICD-10-CM | POA: Diagnosis not present

## 2023-07-03 DIAGNOSIS — R634 Abnormal weight loss: Secondary | ICD-10-CM | POA: Diagnosis not present

## 2023-07-03 DIAGNOSIS — N1831 Chronic kidney disease, stage 3a: Secondary | ICD-10-CM | POA: Diagnosis not present

## 2023-07-03 DIAGNOSIS — I482 Chronic atrial fibrillation, unspecified: Secondary | ICD-10-CM | POA: Diagnosis not present

## 2023-07-03 DIAGNOSIS — G309 Alzheimer's disease, unspecified: Secondary | ICD-10-CM | POA: Diagnosis not present

## 2023-07-03 DIAGNOSIS — F32A Depression, unspecified: Secondary | ICD-10-CM | POA: Diagnosis not present

## 2023-07-04 DIAGNOSIS — R634 Abnormal weight loss: Secondary | ICD-10-CM | POA: Diagnosis not present

## 2023-07-04 DIAGNOSIS — E785 Hyperlipidemia, unspecified: Secondary | ICD-10-CM | POA: Diagnosis not present

## 2023-07-04 DIAGNOSIS — I482 Chronic atrial fibrillation, unspecified: Secondary | ICD-10-CM | POA: Diagnosis not present

## 2023-07-04 DIAGNOSIS — G309 Alzheimer's disease, unspecified: Secondary | ICD-10-CM | POA: Diagnosis not present

## 2023-07-04 DIAGNOSIS — I10 Essential (primary) hypertension: Secondary | ICD-10-CM | POA: Diagnosis not present

## 2023-07-04 DIAGNOSIS — N1831 Chronic kidney disease, stage 3a: Secondary | ICD-10-CM | POA: Diagnosis not present

## 2023-07-06 DIAGNOSIS — I482 Chronic atrial fibrillation, unspecified: Secondary | ICD-10-CM | POA: Diagnosis not present

## 2023-07-06 DIAGNOSIS — I10 Essential (primary) hypertension: Secondary | ICD-10-CM | POA: Diagnosis not present

## 2023-07-06 DIAGNOSIS — G309 Alzheimer's disease, unspecified: Secondary | ICD-10-CM | POA: Diagnosis not present

## 2023-07-06 DIAGNOSIS — N1831 Chronic kidney disease, stage 3a: Secondary | ICD-10-CM | POA: Diagnosis not present

## 2023-07-06 DIAGNOSIS — E785 Hyperlipidemia, unspecified: Secondary | ICD-10-CM | POA: Diagnosis not present

## 2023-07-06 DIAGNOSIS — R634 Abnormal weight loss: Secondary | ICD-10-CM | POA: Diagnosis not present

## 2023-07-11 DIAGNOSIS — I482 Chronic atrial fibrillation, unspecified: Secondary | ICD-10-CM | POA: Diagnosis not present

## 2023-07-11 DIAGNOSIS — E785 Hyperlipidemia, unspecified: Secondary | ICD-10-CM | POA: Diagnosis not present

## 2023-07-11 DIAGNOSIS — G309 Alzheimer's disease, unspecified: Secondary | ICD-10-CM | POA: Diagnosis not present

## 2023-07-11 DIAGNOSIS — I10 Essential (primary) hypertension: Secondary | ICD-10-CM | POA: Diagnosis not present

## 2023-07-11 DIAGNOSIS — N1831 Chronic kidney disease, stage 3a: Secondary | ICD-10-CM | POA: Diagnosis not present

## 2023-07-11 DIAGNOSIS — R634 Abnormal weight loss: Secondary | ICD-10-CM | POA: Diagnosis not present

## 2023-07-18 DIAGNOSIS — E785 Hyperlipidemia, unspecified: Secondary | ICD-10-CM | POA: Diagnosis not present

## 2023-07-18 DIAGNOSIS — G309 Alzheimer's disease, unspecified: Secondary | ICD-10-CM | POA: Diagnosis not present

## 2023-07-18 DIAGNOSIS — I482 Chronic atrial fibrillation, unspecified: Secondary | ICD-10-CM | POA: Diagnosis not present

## 2023-07-18 DIAGNOSIS — N1831 Chronic kidney disease, stage 3a: Secondary | ICD-10-CM | POA: Diagnosis not present

## 2023-07-18 DIAGNOSIS — I10 Essential (primary) hypertension: Secondary | ICD-10-CM | POA: Diagnosis not present

## 2023-07-18 DIAGNOSIS — R634 Abnormal weight loss: Secondary | ICD-10-CM | POA: Diagnosis not present

## 2023-07-25 DIAGNOSIS — I10 Essential (primary) hypertension: Secondary | ICD-10-CM | POA: Diagnosis not present

## 2023-07-25 DIAGNOSIS — E785 Hyperlipidemia, unspecified: Secondary | ICD-10-CM | POA: Diagnosis not present

## 2023-07-25 DIAGNOSIS — I482 Chronic atrial fibrillation, unspecified: Secondary | ICD-10-CM | POA: Diagnosis not present

## 2023-07-25 DIAGNOSIS — N1831 Chronic kidney disease, stage 3a: Secondary | ICD-10-CM | POA: Diagnosis not present

## 2023-07-25 DIAGNOSIS — G309 Alzheimer's disease, unspecified: Secondary | ICD-10-CM | POA: Diagnosis not present

## 2023-07-25 DIAGNOSIS — R634 Abnormal weight loss: Secondary | ICD-10-CM | POA: Diagnosis not present

## 2023-07-31 DIAGNOSIS — N1831 Chronic kidney disease, stage 3a: Secondary | ICD-10-CM | POA: Diagnosis not present

## 2023-07-31 DIAGNOSIS — G309 Alzheimer's disease, unspecified: Secondary | ICD-10-CM | POA: Diagnosis not present

## 2023-07-31 DIAGNOSIS — E785 Hyperlipidemia, unspecified: Secondary | ICD-10-CM | POA: Diagnosis not present

## 2023-07-31 DIAGNOSIS — R634 Abnormal weight loss: Secondary | ICD-10-CM | POA: Diagnosis not present

## 2023-07-31 DIAGNOSIS — I10 Essential (primary) hypertension: Secondary | ICD-10-CM | POA: Diagnosis not present

## 2023-07-31 DIAGNOSIS — I482 Chronic atrial fibrillation, unspecified: Secondary | ICD-10-CM | POA: Diagnosis not present

## 2023-08-02 DIAGNOSIS — F32A Depression, unspecified: Secondary | ICD-10-CM | POA: Diagnosis not present

## 2023-08-02 DIAGNOSIS — G309 Alzheimer's disease, unspecified: Secondary | ICD-10-CM | POA: Diagnosis not present

## 2023-08-02 DIAGNOSIS — N1831 Chronic kidney disease, stage 3a: Secondary | ICD-10-CM | POA: Diagnosis not present

## 2023-08-02 DIAGNOSIS — E785 Hyperlipidemia, unspecified: Secondary | ICD-10-CM | POA: Diagnosis not present

## 2023-08-02 DIAGNOSIS — I482 Chronic atrial fibrillation, unspecified: Secondary | ICD-10-CM | POA: Diagnosis not present

## 2023-08-02 DIAGNOSIS — I10 Essential (primary) hypertension: Secondary | ICD-10-CM | POA: Diagnosis not present

## 2023-08-02 DIAGNOSIS — R634 Abnormal weight loss: Secondary | ICD-10-CM | POA: Diagnosis not present

## 2023-08-03 DIAGNOSIS — R634 Abnormal weight loss: Secondary | ICD-10-CM | POA: Diagnosis not present

## 2023-08-03 DIAGNOSIS — N1831 Chronic kidney disease, stage 3a: Secondary | ICD-10-CM | POA: Diagnosis not present

## 2023-08-03 DIAGNOSIS — I10 Essential (primary) hypertension: Secondary | ICD-10-CM | POA: Diagnosis not present

## 2023-08-03 DIAGNOSIS — E785 Hyperlipidemia, unspecified: Secondary | ICD-10-CM | POA: Diagnosis not present

## 2023-08-03 DIAGNOSIS — I482 Chronic atrial fibrillation, unspecified: Secondary | ICD-10-CM | POA: Diagnosis not present

## 2023-08-03 DIAGNOSIS — G309 Alzheimer's disease, unspecified: Secondary | ICD-10-CM | POA: Diagnosis not present

## 2023-08-07 DIAGNOSIS — E785 Hyperlipidemia, unspecified: Secondary | ICD-10-CM | POA: Diagnosis not present

## 2023-08-07 DIAGNOSIS — I482 Chronic atrial fibrillation, unspecified: Secondary | ICD-10-CM | POA: Diagnosis not present

## 2023-08-07 DIAGNOSIS — R634 Abnormal weight loss: Secondary | ICD-10-CM | POA: Diagnosis not present

## 2023-08-07 DIAGNOSIS — N1831 Chronic kidney disease, stage 3a: Secondary | ICD-10-CM | POA: Diagnosis not present

## 2023-08-07 DIAGNOSIS — I10 Essential (primary) hypertension: Secondary | ICD-10-CM | POA: Diagnosis not present

## 2023-08-07 DIAGNOSIS — G309 Alzheimer's disease, unspecified: Secondary | ICD-10-CM | POA: Diagnosis not present

## 2023-08-14 DIAGNOSIS — G309 Alzheimer's disease, unspecified: Secondary | ICD-10-CM | POA: Diagnosis not present

## 2023-08-14 DIAGNOSIS — I482 Chronic atrial fibrillation, unspecified: Secondary | ICD-10-CM | POA: Diagnosis not present

## 2023-08-14 DIAGNOSIS — N1831 Chronic kidney disease, stage 3a: Secondary | ICD-10-CM | POA: Diagnosis not present

## 2023-08-14 DIAGNOSIS — R634 Abnormal weight loss: Secondary | ICD-10-CM | POA: Diagnosis not present

## 2023-08-14 DIAGNOSIS — E785 Hyperlipidemia, unspecified: Secondary | ICD-10-CM | POA: Diagnosis not present

## 2023-08-14 DIAGNOSIS — I10 Essential (primary) hypertension: Secondary | ICD-10-CM | POA: Diagnosis not present

## 2023-08-22 DIAGNOSIS — G309 Alzheimer's disease, unspecified: Secondary | ICD-10-CM | POA: Diagnosis not present

## 2023-08-22 DIAGNOSIS — R634 Abnormal weight loss: Secondary | ICD-10-CM | POA: Diagnosis not present

## 2023-08-22 DIAGNOSIS — N1831 Chronic kidney disease, stage 3a: Secondary | ICD-10-CM | POA: Diagnosis not present

## 2023-08-22 DIAGNOSIS — I10 Essential (primary) hypertension: Secondary | ICD-10-CM | POA: Diagnosis not present

## 2023-08-22 DIAGNOSIS — E785 Hyperlipidemia, unspecified: Secondary | ICD-10-CM | POA: Diagnosis not present

## 2023-08-22 DIAGNOSIS — I482 Chronic atrial fibrillation, unspecified: Secondary | ICD-10-CM | POA: Diagnosis not present

## 2023-08-28 DIAGNOSIS — I10 Essential (primary) hypertension: Secondary | ICD-10-CM | POA: Diagnosis not present

## 2023-08-28 DIAGNOSIS — I482 Chronic atrial fibrillation, unspecified: Secondary | ICD-10-CM | POA: Diagnosis not present

## 2023-08-28 DIAGNOSIS — E785 Hyperlipidemia, unspecified: Secondary | ICD-10-CM | POA: Diagnosis not present

## 2023-08-28 DIAGNOSIS — N1831 Chronic kidney disease, stage 3a: Secondary | ICD-10-CM | POA: Diagnosis not present

## 2023-08-28 DIAGNOSIS — G309 Alzheimer's disease, unspecified: Secondary | ICD-10-CM | POA: Diagnosis not present

## 2023-08-28 DIAGNOSIS — R634 Abnormal weight loss: Secondary | ICD-10-CM | POA: Diagnosis not present

## 2023-08-30 DIAGNOSIS — I10 Essential (primary) hypertension: Secondary | ICD-10-CM | POA: Diagnosis not present

## 2023-08-30 DIAGNOSIS — E785 Hyperlipidemia, unspecified: Secondary | ICD-10-CM | POA: Diagnosis not present

## 2023-08-30 DIAGNOSIS — I482 Chronic atrial fibrillation, unspecified: Secondary | ICD-10-CM | POA: Diagnosis not present

## 2023-08-30 DIAGNOSIS — N1831 Chronic kidney disease, stage 3a: Secondary | ICD-10-CM | POA: Diagnosis not present

## 2023-08-30 DIAGNOSIS — R634 Abnormal weight loss: Secondary | ICD-10-CM | POA: Diagnosis not present

## 2023-08-30 DIAGNOSIS — G309 Alzheimer's disease, unspecified: Secondary | ICD-10-CM | POA: Diagnosis not present

## 2023-09-02 DIAGNOSIS — R634 Abnormal weight loss: Secondary | ICD-10-CM | POA: Diagnosis not present

## 2023-09-02 DIAGNOSIS — E785 Hyperlipidemia, unspecified: Secondary | ICD-10-CM | POA: Diagnosis not present

## 2023-09-02 DIAGNOSIS — N1831 Chronic kidney disease, stage 3a: Secondary | ICD-10-CM | POA: Diagnosis not present

## 2023-09-02 DIAGNOSIS — F32A Depression, unspecified: Secondary | ICD-10-CM | POA: Diagnosis not present

## 2023-09-02 DIAGNOSIS — I482 Chronic atrial fibrillation, unspecified: Secondary | ICD-10-CM | POA: Diagnosis not present

## 2023-09-02 DIAGNOSIS — I10 Essential (primary) hypertension: Secondary | ICD-10-CM | POA: Diagnosis not present

## 2023-09-02 DIAGNOSIS — G309 Alzheimer's disease, unspecified: Secondary | ICD-10-CM | POA: Diagnosis not present

## 2023-09-03 DIAGNOSIS — E785 Hyperlipidemia, unspecified: Secondary | ICD-10-CM | POA: Diagnosis not present

## 2023-09-03 DIAGNOSIS — I482 Chronic atrial fibrillation, unspecified: Secondary | ICD-10-CM | POA: Diagnosis not present

## 2023-09-03 DIAGNOSIS — N1831 Chronic kidney disease, stage 3a: Secondary | ICD-10-CM | POA: Diagnosis not present

## 2023-09-03 DIAGNOSIS — G309 Alzheimer's disease, unspecified: Secondary | ICD-10-CM | POA: Diagnosis not present

## 2023-09-03 DIAGNOSIS — R634 Abnormal weight loss: Secondary | ICD-10-CM | POA: Diagnosis not present

## 2023-09-03 DIAGNOSIS — I10 Essential (primary) hypertension: Secondary | ICD-10-CM | POA: Diagnosis not present

## 2023-09-04 DIAGNOSIS — I482 Chronic atrial fibrillation, unspecified: Secondary | ICD-10-CM | POA: Diagnosis not present

## 2023-09-04 DIAGNOSIS — N1831 Chronic kidney disease, stage 3a: Secondary | ICD-10-CM | POA: Diagnosis not present

## 2023-09-04 DIAGNOSIS — G309 Alzheimer's disease, unspecified: Secondary | ICD-10-CM | POA: Diagnosis not present

## 2023-09-04 DIAGNOSIS — I10 Essential (primary) hypertension: Secondary | ICD-10-CM | POA: Diagnosis not present

## 2023-09-04 DIAGNOSIS — R634 Abnormal weight loss: Secondary | ICD-10-CM | POA: Diagnosis not present

## 2023-09-04 DIAGNOSIS — E785 Hyperlipidemia, unspecified: Secondary | ICD-10-CM | POA: Diagnosis not present

## 2023-09-11 DIAGNOSIS — N1831 Chronic kidney disease, stage 3a: Secondary | ICD-10-CM | POA: Diagnosis not present

## 2023-09-11 DIAGNOSIS — R634 Abnormal weight loss: Secondary | ICD-10-CM | POA: Diagnosis not present

## 2023-09-11 DIAGNOSIS — E785 Hyperlipidemia, unspecified: Secondary | ICD-10-CM | POA: Diagnosis not present

## 2023-09-11 DIAGNOSIS — I10 Essential (primary) hypertension: Secondary | ICD-10-CM | POA: Diagnosis not present

## 2023-09-11 DIAGNOSIS — G309 Alzheimer's disease, unspecified: Secondary | ICD-10-CM | POA: Diagnosis not present

## 2023-09-11 DIAGNOSIS — I482 Chronic atrial fibrillation, unspecified: Secondary | ICD-10-CM | POA: Diagnosis not present

## 2023-09-18 DIAGNOSIS — R634 Abnormal weight loss: Secondary | ICD-10-CM | POA: Diagnosis not present

## 2023-09-18 DIAGNOSIS — I482 Chronic atrial fibrillation, unspecified: Secondary | ICD-10-CM | POA: Diagnosis not present

## 2023-09-18 DIAGNOSIS — G309 Alzheimer's disease, unspecified: Secondary | ICD-10-CM | POA: Diagnosis not present

## 2023-09-18 DIAGNOSIS — E785 Hyperlipidemia, unspecified: Secondary | ICD-10-CM | POA: Diagnosis not present

## 2023-09-18 DIAGNOSIS — I10 Essential (primary) hypertension: Secondary | ICD-10-CM | POA: Diagnosis not present

## 2023-09-18 DIAGNOSIS — N1831 Chronic kidney disease, stage 3a: Secondary | ICD-10-CM | POA: Diagnosis not present

## 2023-09-26 DIAGNOSIS — I482 Chronic atrial fibrillation, unspecified: Secondary | ICD-10-CM | POA: Diagnosis not present

## 2023-09-26 DIAGNOSIS — R634 Abnormal weight loss: Secondary | ICD-10-CM | POA: Diagnosis not present

## 2023-09-26 DIAGNOSIS — G309 Alzheimer's disease, unspecified: Secondary | ICD-10-CM | POA: Diagnosis not present

## 2023-09-26 DIAGNOSIS — I10 Essential (primary) hypertension: Secondary | ICD-10-CM | POA: Diagnosis not present

## 2023-09-26 DIAGNOSIS — N1831 Chronic kidney disease, stage 3a: Secondary | ICD-10-CM | POA: Diagnosis not present

## 2023-09-26 DIAGNOSIS — E785 Hyperlipidemia, unspecified: Secondary | ICD-10-CM | POA: Diagnosis not present

## 2023-10-02 DIAGNOSIS — F32A Depression, unspecified: Secondary | ICD-10-CM | POA: Diagnosis not present

## 2023-10-02 DIAGNOSIS — N1831 Chronic kidney disease, stage 3a: Secondary | ICD-10-CM | POA: Diagnosis not present

## 2023-10-02 DIAGNOSIS — I482 Chronic atrial fibrillation, unspecified: Secondary | ICD-10-CM | POA: Diagnosis not present

## 2023-10-02 DIAGNOSIS — I10 Essential (primary) hypertension: Secondary | ICD-10-CM | POA: Diagnosis not present

## 2023-10-02 DIAGNOSIS — R634 Abnormal weight loss: Secondary | ICD-10-CM | POA: Diagnosis not present

## 2023-10-02 DIAGNOSIS — E785 Hyperlipidemia, unspecified: Secondary | ICD-10-CM | POA: Diagnosis not present

## 2023-10-02 DIAGNOSIS — G309 Alzheimer's disease, unspecified: Secondary | ICD-10-CM | POA: Diagnosis not present

## 2023-10-03 DIAGNOSIS — E785 Hyperlipidemia, unspecified: Secondary | ICD-10-CM | POA: Diagnosis not present

## 2023-10-03 DIAGNOSIS — N1831 Chronic kidney disease, stage 3a: Secondary | ICD-10-CM | POA: Diagnosis not present

## 2023-10-03 DIAGNOSIS — I10 Essential (primary) hypertension: Secondary | ICD-10-CM | POA: Diagnosis not present

## 2023-10-03 DIAGNOSIS — R634 Abnormal weight loss: Secondary | ICD-10-CM | POA: Diagnosis not present

## 2023-10-03 DIAGNOSIS — G309 Alzheimer's disease, unspecified: Secondary | ICD-10-CM | POA: Diagnosis not present

## 2023-10-03 DIAGNOSIS — I482 Chronic atrial fibrillation, unspecified: Secondary | ICD-10-CM | POA: Diagnosis not present

## 2023-10-10 DIAGNOSIS — E785 Hyperlipidemia, unspecified: Secondary | ICD-10-CM | POA: Diagnosis not present

## 2023-10-10 DIAGNOSIS — R634 Abnormal weight loss: Secondary | ICD-10-CM | POA: Diagnosis not present

## 2023-10-10 DIAGNOSIS — G309 Alzheimer's disease, unspecified: Secondary | ICD-10-CM | POA: Diagnosis not present

## 2023-10-10 DIAGNOSIS — I482 Chronic atrial fibrillation, unspecified: Secondary | ICD-10-CM | POA: Diagnosis not present

## 2023-10-10 DIAGNOSIS — I10 Essential (primary) hypertension: Secondary | ICD-10-CM | POA: Diagnosis not present

## 2023-10-10 DIAGNOSIS — N1831 Chronic kidney disease, stage 3a: Secondary | ICD-10-CM | POA: Diagnosis not present

## 2023-10-12 DIAGNOSIS — N1831 Chronic kidney disease, stage 3a: Secondary | ICD-10-CM | POA: Diagnosis not present

## 2023-10-12 DIAGNOSIS — R634 Abnormal weight loss: Secondary | ICD-10-CM | POA: Diagnosis not present

## 2023-10-12 DIAGNOSIS — E785 Hyperlipidemia, unspecified: Secondary | ICD-10-CM | POA: Diagnosis not present

## 2023-10-12 DIAGNOSIS — I482 Chronic atrial fibrillation, unspecified: Secondary | ICD-10-CM | POA: Diagnosis not present

## 2023-10-12 DIAGNOSIS — I10 Essential (primary) hypertension: Secondary | ICD-10-CM | POA: Diagnosis not present

## 2023-10-12 DIAGNOSIS — G309 Alzheimer's disease, unspecified: Secondary | ICD-10-CM | POA: Diagnosis not present

## 2023-10-17 DIAGNOSIS — G309 Alzheimer's disease, unspecified: Secondary | ICD-10-CM | POA: Diagnosis not present

## 2023-10-17 DIAGNOSIS — I10 Essential (primary) hypertension: Secondary | ICD-10-CM | POA: Diagnosis not present

## 2023-10-17 DIAGNOSIS — I482 Chronic atrial fibrillation, unspecified: Secondary | ICD-10-CM | POA: Diagnosis not present

## 2023-10-17 DIAGNOSIS — R634 Abnormal weight loss: Secondary | ICD-10-CM | POA: Diagnosis not present

## 2023-10-17 DIAGNOSIS — N1831 Chronic kidney disease, stage 3a: Secondary | ICD-10-CM | POA: Diagnosis not present

## 2023-10-17 DIAGNOSIS — E785 Hyperlipidemia, unspecified: Secondary | ICD-10-CM | POA: Diagnosis not present

## 2023-10-22 DIAGNOSIS — N1831 Chronic kidney disease, stage 3a: Secondary | ICD-10-CM | POA: Diagnosis not present

## 2023-10-22 DIAGNOSIS — R634 Abnormal weight loss: Secondary | ICD-10-CM | POA: Diagnosis not present

## 2023-10-22 DIAGNOSIS — G309 Alzheimer's disease, unspecified: Secondary | ICD-10-CM | POA: Diagnosis not present

## 2023-10-22 DIAGNOSIS — I10 Essential (primary) hypertension: Secondary | ICD-10-CM | POA: Diagnosis not present

## 2023-10-22 DIAGNOSIS — I482 Chronic atrial fibrillation, unspecified: Secondary | ICD-10-CM | POA: Diagnosis not present

## 2023-10-22 DIAGNOSIS — E785 Hyperlipidemia, unspecified: Secondary | ICD-10-CM | POA: Diagnosis not present

## 2023-10-23 DIAGNOSIS — N1831 Chronic kidney disease, stage 3a: Secondary | ICD-10-CM | POA: Diagnosis not present

## 2023-10-23 DIAGNOSIS — E785 Hyperlipidemia, unspecified: Secondary | ICD-10-CM | POA: Diagnosis not present

## 2023-10-23 DIAGNOSIS — G309 Alzheimer's disease, unspecified: Secondary | ICD-10-CM | POA: Diagnosis not present

## 2023-10-23 DIAGNOSIS — R634 Abnormal weight loss: Secondary | ICD-10-CM | POA: Diagnosis not present

## 2023-10-23 DIAGNOSIS — I482 Chronic atrial fibrillation, unspecified: Secondary | ICD-10-CM | POA: Diagnosis not present

## 2023-10-23 DIAGNOSIS — I10 Essential (primary) hypertension: Secondary | ICD-10-CM | POA: Diagnosis not present

## 2023-10-31 DIAGNOSIS — N1831 Chronic kidney disease, stage 3a: Secondary | ICD-10-CM | POA: Diagnosis not present

## 2023-10-31 DIAGNOSIS — G309 Alzheimer's disease, unspecified: Secondary | ICD-10-CM | POA: Diagnosis not present

## 2023-10-31 DIAGNOSIS — I482 Chronic atrial fibrillation, unspecified: Secondary | ICD-10-CM | POA: Diagnosis not present

## 2023-10-31 DIAGNOSIS — R634 Abnormal weight loss: Secondary | ICD-10-CM | POA: Diagnosis not present

## 2023-10-31 DIAGNOSIS — E785 Hyperlipidemia, unspecified: Secondary | ICD-10-CM | POA: Diagnosis not present

## 2023-10-31 DIAGNOSIS — I10 Essential (primary) hypertension: Secondary | ICD-10-CM | POA: Diagnosis not present

## 2023-11-02 DIAGNOSIS — G309 Alzheimer's disease, unspecified: Secondary | ICD-10-CM | POA: Diagnosis not present

## 2023-11-02 DIAGNOSIS — I482 Chronic atrial fibrillation, unspecified: Secondary | ICD-10-CM | POA: Diagnosis not present

## 2023-11-02 DIAGNOSIS — E785 Hyperlipidemia, unspecified: Secondary | ICD-10-CM | POA: Diagnosis not present

## 2023-11-02 DIAGNOSIS — I10 Essential (primary) hypertension: Secondary | ICD-10-CM | POA: Diagnosis not present

## 2023-11-02 DIAGNOSIS — F32A Depression, unspecified: Secondary | ICD-10-CM | POA: Diagnosis not present

## 2023-11-02 DIAGNOSIS — N1831 Chronic kidney disease, stage 3a: Secondary | ICD-10-CM | POA: Diagnosis not present

## 2023-11-02 DIAGNOSIS — R634 Abnormal weight loss: Secondary | ICD-10-CM | POA: Diagnosis not present

## 2023-11-05 DIAGNOSIS — E785 Hyperlipidemia, unspecified: Secondary | ICD-10-CM | POA: Diagnosis not present

## 2023-11-05 DIAGNOSIS — I482 Chronic atrial fibrillation, unspecified: Secondary | ICD-10-CM | POA: Diagnosis not present

## 2023-11-05 DIAGNOSIS — N1831 Chronic kidney disease, stage 3a: Secondary | ICD-10-CM | POA: Diagnosis not present

## 2023-11-05 DIAGNOSIS — R634 Abnormal weight loss: Secondary | ICD-10-CM | POA: Diagnosis not present

## 2023-11-05 DIAGNOSIS — G309 Alzheimer's disease, unspecified: Secondary | ICD-10-CM | POA: Diagnosis not present

## 2023-11-05 DIAGNOSIS — I10 Essential (primary) hypertension: Secondary | ICD-10-CM | POA: Diagnosis not present

## 2023-11-07 DIAGNOSIS — G309 Alzheimer's disease, unspecified: Secondary | ICD-10-CM | POA: Diagnosis not present

## 2023-11-07 DIAGNOSIS — R634 Abnormal weight loss: Secondary | ICD-10-CM | POA: Diagnosis not present

## 2023-11-07 DIAGNOSIS — N1831 Chronic kidney disease, stage 3a: Secondary | ICD-10-CM | POA: Diagnosis not present

## 2023-11-07 DIAGNOSIS — I10 Essential (primary) hypertension: Secondary | ICD-10-CM | POA: Diagnosis not present

## 2023-11-07 DIAGNOSIS — I482 Chronic atrial fibrillation, unspecified: Secondary | ICD-10-CM | POA: Diagnosis not present

## 2023-11-07 DIAGNOSIS — E785 Hyperlipidemia, unspecified: Secondary | ICD-10-CM | POA: Diagnosis not present

## 2023-11-14 DIAGNOSIS — I482 Chronic atrial fibrillation, unspecified: Secondary | ICD-10-CM | POA: Diagnosis not present

## 2023-11-14 DIAGNOSIS — G309 Alzheimer's disease, unspecified: Secondary | ICD-10-CM | POA: Diagnosis not present

## 2023-11-14 DIAGNOSIS — E785 Hyperlipidemia, unspecified: Secondary | ICD-10-CM | POA: Diagnosis not present

## 2023-11-14 DIAGNOSIS — I10 Essential (primary) hypertension: Secondary | ICD-10-CM | POA: Diagnosis not present

## 2023-11-14 DIAGNOSIS — N1831 Chronic kidney disease, stage 3a: Secondary | ICD-10-CM | POA: Diagnosis not present

## 2023-11-14 DIAGNOSIS — R634 Abnormal weight loss: Secondary | ICD-10-CM | POA: Diagnosis not present

## 2023-11-21 DIAGNOSIS — R634 Abnormal weight loss: Secondary | ICD-10-CM | POA: Diagnosis not present

## 2023-11-21 DIAGNOSIS — E785 Hyperlipidemia, unspecified: Secondary | ICD-10-CM | POA: Diagnosis not present

## 2023-11-21 DIAGNOSIS — I10 Essential (primary) hypertension: Secondary | ICD-10-CM | POA: Diagnosis not present

## 2023-11-21 DIAGNOSIS — N1831 Chronic kidney disease, stage 3a: Secondary | ICD-10-CM | POA: Diagnosis not present

## 2023-11-21 DIAGNOSIS — I482 Chronic atrial fibrillation, unspecified: Secondary | ICD-10-CM | POA: Diagnosis not present

## 2023-11-21 DIAGNOSIS — G309 Alzheimer's disease, unspecified: Secondary | ICD-10-CM | POA: Diagnosis not present

## 2023-11-27 DIAGNOSIS — E785 Hyperlipidemia, unspecified: Secondary | ICD-10-CM | POA: Diagnosis not present

## 2023-11-27 DIAGNOSIS — I482 Chronic atrial fibrillation, unspecified: Secondary | ICD-10-CM | POA: Diagnosis not present

## 2023-11-27 DIAGNOSIS — I10 Essential (primary) hypertension: Secondary | ICD-10-CM | POA: Diagnosis not present

## 2023-11-27 DIAGNOSIS — R634 Abnormal weight loss: Secondary | ICD-10-CM | POA: Diagnosis not present

## 2023-11-27 DIAGNOSIS — G309 Alzheimer's disease, unspecified: Secondary | ICD-10-CM | POA: Diagnosis not present

## 2023-11-27 DIAGNOSIS — N1831 Chronic kidney disease, stage 3a: Secondary | ICD-10-CM | POA: Diagnosis not present

## 2023-11-28 DIAGNOSIS — G309 Alzheimer's disease, unspecified: Secondary | ICD-10-CM | POA: Diagnosis not present

## 2023-11-28 DIAGNOSIS — E785 Hyperlipidemia, unspecified: Secondary | ICD-10-CM | POA: Diagnosis not present

## 2023-11-28 DIAGNOSIS — I482 Chronic atrial fibrillation, unspecified: Secondary | ICD-10-CM | POA: Diagnosis not present

## 2023-11-28 DIAGNOSIS — N1831 Chronic kidney disease, stage 3a: Secondary | ICD-10-CM | POA: Diagnosis not present

## 2023-11-28 DIAGNOSIS — I10 Essential (primary) hypertension: Secondary | ICD-10-CM | POA: Diagnosis not present

## 2023-11-28 DIAGNOSIS — R634 Abnormal weight loss: Secondary | ICD-10-CM | POA: Diagnosis not present

## 2023-12-03 DIAGNOSIS — R634 Abnormal weight loss: Secondary | ICD-10-CM | POA: Diagnosis not present

## 2023-12-03 DIAGNOSIS — N1831 Chronic kidney disease, stage 3a: Secondary | ICD-10-CM | POA: Diagnosis not present

## 2023-12-03 DIAGNOSIS — I482 Chronic atrial fibrillation, unspecified: Secondary | ICD-10-CM | POA: Diagnosis not present

## 2023-12-03 DIAGNOSIS — F32A Depression, unspecified: Secondary | ICD-10-CM | POA: Diagnosis not present

## 2023-12-03 DIAGNOSIS — E785 Hyperlipidemia, unspecified: Secondary | ICD-10-CM | POA: Diagnosis not present

## 2023-12-03 DIAGNOSIS — I10 Essential (primary) hypertension: Secondary | ICD-10-CM | POA: Diagnosis not present

## 2023-12-03 DIAGNOSIS — G309 Alzheimer's disease, unspecified: Secondary | ICD-10-CM | POA: Diagnosis not present

## 2023-12-04 DIAGNOSIS — N1831 Chronic kidney disease, stage 3a: Secondary | ICD-10-CM | POA: Diagnosis not present

## 2023-12-04 DIAGNOSIS — I10 Essential (primary) hypertension: Secondary | ICD-10-CM | POA: Diagnosis not present

## 2023-12-04 DIAGNOSIS — R634 Abnormal weight loss: Secondary | ICD-10-CM | POA: Diagnosis not present

## 2023-12-04 DIAGNOSIS — I482 Chronic atrial fibrillation, unspecified: Secondary | ICD-10-CM | POA: Diagnosis not present

## 2023-12-04 DIAGNOSIS — E785 Hyperlipidemia, unspecified: Secondary | ICD-10-CM | POA: Diagnosis not present

## 2023-12-04 DIAGNOSIS — G309 Alzheimer's disease, unspecified: Secondary | ICD-10-CM | POA: Diagnosis not present

## 2023-12-05 DIAGNOSIS — I482 Chronic atrial fibrillation, unspecified: Secondary | ICD-10-CM | POA: Diagnosis not present

## 2023-12-05 DIAGNOSIS — I10 Essential (primary) hypertension: Secondary | ICD-10-CM | POA: Diagnosis not present

## 2023-12-05 DIAGNOSIS — R634 Abnormal weight loss: Secondary | ICD-10-CM | POA: Diagnosis not present

## 2023-12-05 DIAGNOSIS — N1831 Chronic kidney disease, stage 3a: Secondary | ICD-10-CM | POA: Diagnosis not present

## 2023-12-05 DIAGNOSIS — G309 Alzheimer's disease, unspecified: Secondary | ICD-10-CM | POA: Diagnosis not present

## 2023-12-05 DIAGNOSIS — E785 Hyperlipidemia, unspecified: Secondary | ICD-10-CM | POA: Diagnosis not present

## 2023-12-12 DIAGNOSIS — N1831 Chronic kidney disease, stage 3a: Secondary | ICD-10-CM | POA: Diagnosis not present

## 2023-12-12 DIAGNOSIS — E785 Hyperlipidemia, unspecified: Secondary | ICD-10-CM | POA: Diagnosis not present

## 2023-12-12 DIAGNOSIS — I482 Chronic atrial fibrillation, unspecified: Secondary | ICD-10-CM | POA: Diagnosis not present

## 2023-12-12 DIAGNOSIS — G309 Alzheimer's disease, unspecified: Secondary | ICD-10-CM | POA: Diagnosis not present

## 2023-12-12 DIAGNOSIS — R634 Abnormal weight loss: Secondary | ICD-10-CM | POA: Diagnosis not present

## 2023-12-12 DIAGNOSIS — I10 Essential (primary) hypertension: Secondary | ICD-10-CM | POA: Diagnosis not present

## 2023-12-19 DIAGNOSIS — G309 Alzheimer's disease, unspecified: Secondary | ICD-10-CM | POA: Diagnosis not present

## 2023-12-19 DIAGNOSIS — R634 Abnormal weight loss: Secondary | ICD-10-CM | POA: Diagnosis not present

## 2023-12-19 DIAGNOSIS — I482 Chronic atrial fibrillation, unspecified: Secondary | ICD-10-CM | POA: Diagnosis not present

## 2023-12-19 DIAGNOSIS — E785 Hyperlipidemia, unspecified: Secondary | ICD-10-CM | POA: Diagnosis not present

## 2023-12-19 DIAGNOSIS — I10 Essential (primary) hypertension: Secondary | ICD-10-CM | POA: Diagnosis not present

## 2023-12-19 DIAGNOSIS — N1831 Chronic kidney disease, stage 3a: Secondary | ICD-10-CM | POA: Diagnosis not present

## 2023-12-26 DIAGNOSIS — I10 Essential (primary) hypertension: Secondary | ICD-10-CM | POA: Diagnosis not present

## 2023-12-26 DIAGNOSIS — E785 Hyperlipidemia, unspecified: Secondary | ICD-10-CM | POA: Diagnosis not present

## 2023-12-26 DIAGNOSIS — G309 Alzheimer's disease, unspecified: Secondary | ICD-10-CM | POA: Diagnosis not present

## 2023-12-26 DIAGNOSIS — N1831 Chronic kidney disease, stage 3a: Secondary | ICD-10-CM | POA: Diagnosis not present

## 2023-12-26 DIAGNOSIS — I482 Chronic atrial fibrillation, unspecified: Secondary | ICD-10-CM | POA: Diagnosis not present

## 2023-12-26 DIAGNOSIS — R634 Abnormal weight loss: Secondary | ICD-10-CM | POA: Diagnosis not present

## 2024-01-02 DIAGNOSIS — N1831 Chronic kidney disease, stage 3a: Secondary | ICD-10-CM | POA: Diagnosis not present

## 2024-01-02 DIAGNOSIS — G309 Alzheimer's disease, unspecified: Secondary | ICD-10-CM | POA: Diagnosis not present

## 2024-01-02 DIAGNOSIS — I10 Essential (primary) hypertension: Secondary | ICD-10-CM | POA: Diagnosis not present

## 2024-01-02 DIAGNOSIS — E785 Hyperlipidemia, unspecified: Secondary | ICD-10-CM | POA: Diagnosis not present

## 2024-01-02 DIAGNOSIS — F32A Depression, unspecified: Secondary | ICD-10-CM | POA: Diagnosis not present

## 2024-01-02 DIAGNOSIS — I482 Chronic atrial fibrillation, unspecified: Secondary | ICD-10-CM | POA: Diagnosis not present

## 2024-01-02 DIAGNOSIS — R634 Abnormal weight loss: Secondary | ICD-10-CM | POA: Diagnosis not present

## 2024-01-09 DIAGNOSIS — N1831 Chronic kidney disease, stage 3a: Secondary | ICD-10-CM | POA: Diagnosis not present

## 2024-01-09 DIAGNOSIS — G309 Alzheimer's disease, unspecified: Secondary | ICD-10-CM | POA: Diagnosis not present

## 2024-01-09 DIAGNOSIS — E785 Hyperlipidemia, unspecified: Secondary | ICD-10-CM | POA: Diagnosis not present

## 2024-01-09 DIAGNOSIS — R634 Abnormal weight loss: Secondary | ICD-10-CM | POA: Diagnosis not present

## 2024-01-09 DIAGNOSIS — I10 Essential (primary) hypertension: Secondary | ICD-10-CM | POA: Diagnosis not present

## 2024-01-09 DIAGNOSIS — I482 Chronic atrial fibrillation, unspecified: Secondary | ICD-10-CM | POA: Diagnosis not present

## 2024-01-16 DIAGNOSIS — G309 Alzheimer's disease, unspecified: Secondary | ICD-10-CM | POA: Diagnosis not present

## 2024-01-16 DIAGNOSIS — I482 Chronic atrial fibrillation, unspecified: Secondary | ICD-10-CM | POA: Diagnosis not present

## 2024-01-16 DIAGNOSIS — E785 Hyperlipidemia, unspecified: Secondary | ICD-10-CM | POA: Diagnosis not present

## 2024-01-16 DIAGNOSIS — R634 Abnormal weight loss: Secondary | ICD-10-CM | POA: Diagnosis not present

## 2024-01-16 DIAGNOSIS — I10 Essential (primary) hypertension: Secondary | ICD-10-CM | POA: Diagnosis not present

## 2024-01-16 DIAGNOSIS — N1831 Chronic kidney disease, stage 3a: Secondary | ICD-10-CM | POA: Diagnosis not present

## 2024-01-23 DIAGNOSIS — G309 Alzheimer's disease, unspecified: Secondary | ICD-10-CM | POA: Diagnosis not present

## 2024-01-23 DIAGNOSIS — I10 Essential (primary) hypertension: Secondary | ICD-10-CM | POA: Diagnosis not present

## 2024-01-23 DIAGNOSIS — N1831 Chronic kidney disease, stage 3a: Secondary | ICD-10-CM | POA: Diagnosis not present

## 2024-01-23 DIAGNOSIS — I482 Chronic atrial fibrillation, unspecified: Secondary | ICD-10-CM | POA: Diagnosis not present

## 2024-01-23 DIAGNOSIS — R634 Abnormal weight loss: Secondary | ICD-10-CM | POA: Diagnosis not present

## 2024-01-23 DIAGNOSIS — E785 Hyperlipidemia, unspecified: Secondary | ICD-10-CM | POA: Diagnosis not present

## 2024-01-24 DIAGNOSIS — E785 Hyperlipidemia, unspecified: Secondary | ICD-10-CM | POA: Diagnosis not present

## 2024-01-24 DIAGNOSIS — I10 Essential (primary) hypertension: Secondary | ICD-10-CM | POA: Diagnosis not present

## 2024-01-24 DIAGNOSIS — I482 Chronic atrial fibrillation, unspecified: Secondary | ICD-10-CM | POA: Diagnosis not present

## 2024-01-24 DIAGNOSIS — N1831 Chronic kidney disease, stage 3a: Secondary | ICD-10-CM | POA: Diagnosis not present

## 2024-01-24 DIAGNOSIS — R634 Abnormal weight loss: Secondary | ICD-10-CM | POA: Diagnosis not present

## 2024-01-24 DIAGNOSIS — G309 Alzheimer's disease, unspecified: Secondary | ICD-10-CM | POA: Diagnosis not present

## 2024-01-30 DIAGNOSIS — N1831 Chronic kidney disease, stage 3a: Secondary | ICD-10-CM | POA: Diagnosis not present

## 2024-01-30 DIAGNOSIS — I482 Chronic atrial fibrillation, unspecified: Secondary | ICD-10-CM | POA: Diagnosis not present

## 2024-01-30 DIAGNOSIS — G309 Alzheimer's disease, unspecified: Secondary | ICD-10-CM | POA: Diagnosis not present

## 2024-01-30 DIAGNOSIS — I10 Essential (primary) hypertension: Secondary | ICD-10-CM | POA: Diagnosis not present

## 2024-01-30 DIAGNOSIS — E785 Hyperlipidemia, unspecified: Secondary | ICD-10-CM | POA: Diagnosis not present

## 2024-01-30 DIAGNOSIS — R634 Abnormal weight loss: Secondary | ICD-10-CM | POA: Diagnosis not present

## 2024-02-02 DIAGNOSIS — G309 Alzheimer's disease, unspecified: Secondary | ICD-10-CM | POA: Diagnosis not present

## 2024-02-02 DIAGNOSIS — N1831 Chronic kidney disease, stage 3a: Secondary | ICD-10-CM | POA: Diagnosis not present

## 2024-02-02 DIAGNOSIS — I10 Essential (primary) hypertension: Secondary | ICD-10-CM | POA: Diagnosis not present

## 2024-02-02 DIAGNOSIS — I482 Chronic atrial fibrillation, unspecified: Secondary | ICD-10-CM | POA: Diagnosis not present

## 2024-02-02 DIAGNOSIS — F32A Depression, unspecified: Secondary | ICD-10-CM | POA: Diagnosis not present

## 2024-02-02 DIAGNOSIS — E785 Hyperlipidemia, unspecified: Secondary | ICD-10-CM | POA: Diagnosis not present

## 2024-02-02 DIAGNOSIS — R634 Abnormal weight loss: Secondary | ICD-10-CM | POA: Diagnosis not present

## 2024-02-04 DIAGNOSIS — I482 Chronic atrial fibrillation, unspecified: Secondary | ICD-10-CM | POA: Diagnosis not present

## 2024-02-04 DIAGNOSIS — E785 Hyperlipidemia, unspecified: Secondary | ICD-10-CM | POA: Diagnosis not present

## 2024-02-04 DIAGNOSIS — G309 Alzheimer's disease, unspecified: Secondary | ICD-10-CM | POA: Diagnosis not present

## 2024-02-04 DIAGNOSIS — N1831 Chronic kidney disease, stage 3a: Secondary | ICD-10-CM | POA: Diagnosis not present

## 2024-02-04 DIAGNOSIS — I10 Essential (primary) hypertension: Secondary | ICD-10-CM | POA: Diagnosis not present

## 2024-02-04 DIAGNOSIS — R634 Abnormal weight loss: Secondary | ICD-10-CM | POA: Diagnosis not present

## 2024-02-06 DIAGNOSIS — I10 Essential (primary) hypertension: Secondary | ICD-10-CM | POA: Diagnosis not present

## 2024-02-06 DIAGNOSIS — I482 Chronic atrial fibrillation, unspecified: Secondary | ICD-10-CM | POA: Diagnosis not present

## 2024-02-06 DIAGNOSIS — N1831 Chronic kidney disease, stage 3a: Secondary | ICD-10-CM | POA: Diagnosis not present

## 2024-02-06 DIAGNOSIS — E785 Hyperlipidemia, unspecified: Secondary | ICD-10-CM | POA: Diagnosis not present

## 2024-02-06 DIAGNOSIS — G309 Alzheimer's disease, unspecified: Secondary | ICD-10-CM | POA: Diagnosis not present

## 2024-02-06 DIAGNOSIS — R634 Abnormal weight loss: Secondary | ICD-10-CM | POA: Diagnosis not present

## 2024-02-13 DIAGNOSIS — E785 Hyperlipidemia, unspecified: Secondary | ICD-10-CM | POA: Diagnosis not present

## 2024-02-13 DIAGNOSIS — N1831 Chronic kidney disease, stage 3a: Secondary | ICD-10-CM | POA: Diagnosis not present

## 2024-02-13 DIAGNOSIS — I10 Essential (primary) hypertension: Secondary | ICD-10-CM | POA: Diagnosis not present

## 2024-02-13 DIAGNOSIS — R634 Abnormal weight loss: Secondary | ICD-10-CM | POA: Diagnosis not present

## 2024-02-13 DIAGNOSIS — G309 Alzheimer's disease, unspecified: Secondary | ICD-10-CM | POA: Diagnosis not present

## 2024-02-13 DIAGNOSIS — I482 Chronic atrial fibrillation, unspecified: Secondary | ICD-10-CM | POA: Diagnosis not present

## 2024-02-20 DIAGNOSIS — E785 Hyperlipidemia, unspecified: Secondary | ICD-10-CM | POA: Diagnosis not present

## 2024-02-20 DIAGNOSIS — R634 Abnormal weight loss: Secondary | ICD-10-CM | POA: Diagnosis not present

## 2024-02-20 DIAGNOSIS — I482 Chronic atrial fibrillation, unspecified: Secondary | ICD-10-CM | POA: Diagnosis not present

## 2024-02-20 DIAGNOSIS — I10 Essential (primary) hypertension: Secondary | ICD-10-CM | POA: Diagnosis not present

## 2024-02-20 DIAGNOSIS — G309 Alzheimer's disease, unspecified: Secondary | ICD-10-CM | POA: Diagnosis not present

## 2024-02-20 DIAGNOSIS — N1831 Chronic kidney disease, stage 3a: Secondary | ICD-10-CM | POA: Diagnosis not present

## 2024-02-22 DIAGNOSIS — E785 Hyperlipidemia, unspecified: Secondary | ICD-10-CM | POA: Diagnosis not present

## 2024-02-22 DIAGNOSIS — G309 Alzheimer's disease, unspecified: Secondary | ICD-10-CM | POA: Diagnosis not present

## 2024-02-22 DIAGNOSIS — I482 Chronic atrial fibrillation, unspecified: Secondary | ICD-10-CM | POA: Diagnosis not present

## 2024-02-22 DIAGNOSIS — I10 Essential (primary) hypertension: Secondary | ICD-10-CM | POA: Diagnosis not present

## 2024-02-22 DIAGNOSIS — R634 Abnormal weight loss: Secondary | ICD-10-CM | POA: Diagnosis not present

## 2024-02-22 DIAGNOSIS — N1831 Chronic kidney disease, stage 3a: Secondary | ICD-10-CM | POA: Diagnosis not present

## 2024-02-27 DIAGNOSIS — I482 Chronic atrial fibrillation, unspecified: Secondary | ICD-10-CM | POA: Diagnosis not present

## 2024-02-27 DIAGNOSIS — E785 Hyperlipidemia, unspecified: Secondary | ICD-10-CM | POA: Diagnosis not present

## 2024-02-27 DIAGNOSIS — N1831 Chronic kidney disease, stage 3a: Secondary | ICD-10-CM | POA: Diagnosis not present

## 2024-02-27 DIAGNOSIS — R634 Abnormal weight loss: Secondary | ICD-10-CM | POA: Diagnosis not present

## 2024-02-27 DIAGNOSIS — G309 Alzheimer's disease, unspecified: Secondary | ICD-10-CM | POA: Diagnosis not present

## 2024-02-27 DIAGNOSIS — I10 Essential (primary) hypertension: Secondary | ICD-10-CM | POA: Diagnosis not present
